# Patient Record
Sex: Male | Born: 1940
Health system: Southern US, Community
[De-identification: ages and names within clinical notes are randomized; demographics above are authoritative.]

## PROBLEM LIST (undated history)

## (undated) DIAGNOSIS — C9140 Hairy cell leukemia not having achieved remission: Secondary | ICD-10-CM

## (undated) DIAGNOSIS — B059 Measles without complication: Secondary | ICD-10-CM

## (undated) DIAGNOSIS — M1990 Unspecified osteoarthritis, unspecified site: Secondary | ICD-10-CM

## (undated) DIAGNOSIS — B269 Mumps without complication: Secondary | ICD-10-CM

## (undated) DIAGNOSIS — M7542 Impingement syndrome of left shoulder: Secondary | ICD-10-CM

## (undated) DIAGNOSIS — S42402D Unspecified fracture of lower end of left humerus, subsequent encounter for fracture with routine healing: Secondary | ICD-10-CM

## (undated) HISTORY — PX: TONSILLECTOMY: SUR1361

---

## 1988-12-11 DIAGNOSIS — C9140 Hairy cell leukemia not having achieved remission: Secondary | ICD-10-CM

## 1988-12-11 HISTORY — PX: OTHER SURGICAL HISTORY: SHX169

## 1988-12-11 HISTORY — DX: Hairy cell leukemia not having achieved remission: C91.40

## 2016-01-13 ENCOUNTER — Ambulatory Visit (INDEPENDENT_AMBULATORY_CARE_PROVIDER_SITE_OTHER): Payer: Medicare Other | Admitting: Sports Medicine

## 2016-01-13 ENCOUNTER — Encounter: Payer: Self-pay | Admitting: Sports Medicine

## 2016-01-13 VITALS — BP 153/93 | HR 75 | Ht 70.0 in | Wt 165.0 lb

## 2016-01-13 DIAGNOSIS — M170 Bilateral primary osteoarthritis of knee: Secondary | ICD-10-CM

## 2016-01-13 MED ORDER — TRAMADOL HCL 50 MG PO TABS: 50.0000 mg | ORAL_TABLET | Freq: Four times a day (QID) | ORAL | Status: DC | PRN

## 2016-01-13 NOTE — Progress Notes (Addendum)
    Subjective:    CC: Bilateral knee pain  HPI: [name] is a 75 YO gentleman with no significant past medical history who presents today for worsening bilateral knee pain. Pain began around 10 years ago. He began working out and strengthening his quads and hamstrings with improvement in his pain for 3-4 years. Over the past year, pain has gradually worsened despite continued strength training. Previous history of R meniscal injury that was repaired 5 years ago. - Pain described as "sharp and aching" and located on the medial and lateral aspects of bilateral knees, R > L - No pain while walking, but going up and down steps and getting out of a car makes the pain worse. - Pain worst at end of day. Rarely wakes him up at night. - Has been taking meloxicam for the past 3 weeks with improvement - Denies catching/popping in the knees - Pain does not radiate.  - Minor swelling of the knees on occasion.  Past medical history, Surgical history, Family history not pertinant except as noted below, Social history, Allergies, and medications have been entered into the medical record, reviewed, and no changes needed.   Patient currently works part time at [employer] and gets in and out of cars frequently. Retired from [employer] after 20 years of service. 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 Is a non-smoker.  Review of Systems: No headache, visual changes, dizziness, skin rash, fevers, chills, weight loss, chest pain, joint swelling, muscle aches, or shortness of breath.  Objective:    General: Well Developed, well nourished, and in no acute distress.  Neuro: Alert and oriented x3, extra-ocular muscles intact, sensation grossly intact.  HEENT: Normocephalic, atraumatic, pupils equal round reactive to light.  Skin: Warm and dry, no rashes noted.   R knee: No erythema, mild joint effusion.  No tenderness to palpation over joint lines and bony  landmarks. 135 degrees flexion, -14 degrees extension 5/5 quadriceps and hamstring strength Negative Thessaly test Neurovascularly intact  L knee: No erythema, mild joint effusion. Medial spurring, lateral collapse, and valgus deformity noted. No tenderness to palpation over joint lines or bony landmarks 120 degrees flexion, -10 degrees extension 5/5 quadriceps and hamstring strength Negative Thessaly test Neurovascularly intact  Feet: L 2nd digit hammer toe R 2nd - 5th digits hammer toes Bilateral pez planus 1-2 cm leg length discrepancy R>L  Gait: R leg valgus eversion R leg external rotation L valgus shift  Review of outside XR of BL knees: Grade 4 osteoarthritis of the bilateral knees Medial collapse on left Lat. Collapse on RT w valgus shift  Impression and Recommendations:   1.) Osteoarthritis of the bilateral knees - Review of patient's x-rays demonstrates significant osteoarthritis of the bilateral knees, but clinically patient is relatively well-appearing and has only minimal limitations in function.  - Corrected leg-length discrepancy with L shoe insert - Encouraged patient to stay active with recumbent bike or stationary bike activity. Avoid deep knee bends, such as lunges or squats - Tramadol 50 mg q6h PRN for pain - Discontinue Mobic (pt reports recent increase in blood pressure)  - Gave prescription for compression sleeves to wear while at work, during physical activity - Interested in clinical trial, will attend meeting on 2/8 -  -     The patient was counselled, risk factors were discussed, anticipatory guidance given.

## 2016-01-14 DIAGNOSIS — M170 Bilateral primary osteoarthritis of knee: Secondary | ICD-10-CM | POA: Insufficient documentation

## 2016-01-14 NOTE — Assessment & Plan Note (Signed)
This is severe OA Compensation is very good because of his strength  HEP will be instituted in knee OA study Will consider compression  Add wedge to left leg to correct gait  Tramadol for pain  I spent 30 mins face to face with > 50% spent counseling for care and HEP for his OA of knees

## 2016-01-26 ENCOUNTER — Other Ambulatory Visit: Payer: Self-pay | Admitting: *Deleted

## 2016-01-26 MED ORDER — TRAMADOL HCL 50 MG PO TABS: 50.0000 mg | ORAL_TABLET | Freq: Three times a day (TID) | ORAL | Status: DC | PRN

## 2016-03-23 ENCOUNTER — Ambulatory Visit (INDEPENDENT_AMBULATORY_CARE_PROVIDER_SITE_OTHER): Payer: Medicare Other | Admitting: Sports Medicine

## 2016-03-23 ENCOUNTER — Encounter: Payer: Self-pay | Admitting: Sports Medicine

## 2016-03-23 VITALS — BP 140/86 | Ht 70.0 in | Wt 165.0 lb

## 2016-03-23 DIAGNOSIS — M170 Bilateral primary osteoarthritis of knee: Secondary | ICD-10-CM

## 2016-03-23 NOTE — Progress Notes (Signed)
Patient ID: Stephen Nixon, male   DOB: March 13, 1941, 75 y.o.   MRN: [id number]  CC: Here to discuss bilateral knee replacement  Patient returns with bilateral grade 4 osteoarthritis of the knees. He has had a great deal of pain relief using tramadol usually about 3 per day. Compression really helps him. He participated in osteoarthritis research project with home exercises that have made him stronger. 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  Today we discussed options for him to consider regarding joint replacement.  Functionally he has significant valgus deformity bilaterally. This affects his gait and gets them daily pain. In addition he has a great deal of difficulty bending his knees. 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 He has difficulty bending getting in and out of cars.  Probably has a good pain tolerance and can actually do a lot of activity in spite of his arthritis he knows that he has difficulty at work and has had to give up his martial arts. He thinks he would like to consider bilateral knee replacement.  He is impressed with quickly I have recovered and feels that he would have the motivation to push himself hard with recovery period  Social history shows that he is retired from the [employer]. He has traveled extensively. He has never been a smoker . He has always been thin and athletic.

## 2016-03-23 NOTE — Patient Instructions (Signed)
[place] [address] [place], [place] [address] [phone] Dr Gaynelle Arabian Thursday 06/08/16 at 1030am

## 2016-03-23 NOTE — Assessment & Plan Note (Signed)
I feel he is an excellent candidate for bilateral knee replacement. However will physical fitness . Low BMI. History of significant athletic training. High motivation . I will send him to Dr. Maureen Ralphs to see if he thinks he is a good candidate for bilateral replacement.  I spent 20 minutes face-to-face counseling regarding his knee arthritis.

## 2016-07-08 ENCOUNTER — Other Ambulatory Visit: Payer: Self-pay | Admitting: Sports Medicine

## 2016-09-22 NOTE — Progress Notes (Signed)
Surgery on 10/04/2016.  Preop on 10/18 need orders in EPIC.  Thank You

## 2016-09-25 NOTE — Progress Notes (Signed)
Surgery on 10/25.  preop on 10/18.  Need orders in EPIC.  Thank You.

## 2016-09-26 NOTE — Progress Notes (Signed)
Surgery on 10/25.  Preop on 10/18.  Need orders in EPIC.  Thank You.

## 2016-09-27 ENCOUNTER — Encounter (HOSPITAL_COMMUNITY)
Admission: RE | Admit: 2016-09-27 | Discharge: 2016-09-27 | Disposition: A | Discharge status: Home / Self Care | Payer: Medicare Other | Source: Ambulatory Visit | Attending: Orthopedic Surgery | Admitting: Orthopedic Surgery

## 2016-09-27 ENCOUNTER — Encounter (HOSPITAL_COMMUNITY): Payer: Self-pay

## 2016-09-27 ENCOUNTER — Ambulatory Visit: Payer: Self-pay | Admitting: Orthopedic Surgery

## 2016-09-27 DIAGNOSIS — M170 Bilateral primary osteoarthritis of knee: Secondary | ICD-10-CM | POA: Diagnosis not present

## 2016-09-27 DIAGNOSIS — Z01812 Encounter for preprocedural laboratory examination: Secondary | ICD-10-CM | POA: Insufficient documentation

## 2016-09-27 HISTORY — DX: Unspecified osteoarthritis, unspecified site: M19.90

## 2016-09-27 LAB — COMPREHENSIVE METABOLIC PANEL
ALBUMIN: 3.9 g/dL (ref 3.5–5.0)
ALT: 14 U/L — ABNORMAL LOW (ref 17–63)
AST: 24 U/L (ref 15–41)
Alkaline Phosphatase: 75 U/L (ref 38–126)
Anion gap: 9 (ref 5–15)
BUN: 17 mg/dL (ref 6–20)
CHLORIDE: 103 mmol/L (ref 101–111)
CO2: 28 mmol/L (ref 22–32)
Calcium: 9 mg/dL (ref 8.9–10.3)
Creatinine, Ser: 0.7 mg/dL (ref 0.61–1.24)
GFR calc Af Amer: >60 mL/min (ref >60)
GFR calc non Af Amer: >60 mL/min (ref >60)
GLUCOSE: 92 mg/dL (ref 65–99)
POTASSIUM: 4 mmol/L (ref 3.5–5.1)
SODIUM: 140 mmol/L (ref 135–145)
Total Bilirubin: 1 mg/dL (ref 0.3–1.2)
Total Protein: 7 g/dL (ref 6.5–8.1)

## 2016-09-27 LAB — CBC
HCT: 42.3 % (ref 39.0–52.0)
Hemoglobin: 14.2 g/dL (ref 13.0–17.0)
MCH: 31.4 pg (ref 26.0–34.0)
MCHC: 33.6 g/dL (ref 30.0–36.0)
MCV: 93.6 fL (ref 78.0–100.0)
PLATELETS: 311 10*3/uL (ref 150–400)
RBC: 4.52 MIL/uL (ref 4.22–5.81)
RDW: 14 % (ref 11.5–15.5)
WBC: 9.1 10*3/uL (ref 4.0–10.5)

## 2016-09-27 LAB — PROTIME-INR
INR: 0.98
PROTHROMBIN TIME: 13 s (ref 11.4–15.2)

## 2016-09-27 LAB — ABO/RH: ABO/RH(D): B POS

## 2016-09-27 LAB — SURGICAL PCR SCREEN
MRSA, PCR: NEGATIVE
STAPHYLOCOCCUS AUREUS: NEGATIVE

## 2016-09-27 LAB — APTT: APTT: 31 s (ref 24–36)

## 2016-09-27 NOTE — Progress Notes (Signed)
Patient did not sign consent form as he stated he was having a partial knee surgery on both knees. Can you clarify this please!

## 2016-09-27 NOTE — Patient Instructions (Signed)
Stephen Nixon  09/27/2016   Your procedure is scheduled on: Wednesday 10/04/2016  Report to [facility] Main  Entrance take [facility]  elevators to 3rd floor to  [place] at  1030  AM.  Call this number if you have problems the morning of surgery [phone]   Remember: ONLY 1 PERSON MAY GO WITH YOU TO SHORT STAY TO GET  READY MORNING OF [place].    Do not eat food  :After Midnight.   MAY HAVE CLEAR LIQUIDS FROM MIDNIGHT UP UNTIL 0730 AM THEN NOTHING UNTIL AFTER SURGERY!     CLEAR LIQUID DIET   Foods Allowed                                                                     Foods Excluded  Coffee and tea, regular and decaf                             liquids that you cannot  Plain Jell-O in any flavor                                             see through such as: Fruit ices (not with fruit pulp)                                     milk, soups, orange juice  Iced Popsicles                                    All solid food Carbonated beverages, regular and diet                                    Cranberry, grape and apple juices Sports drinks like Gatorade Lightly seasoned clear broth or consume(fat free) Sugar, honey syrup  Sample Menu Breakfast                                Lunch                                     Supper Cranberry juice                    Beef broth                            Chicken broth Jell-O                                     Grape juice  Apple juice Coffee or tea                        Jell-O                                      Popsicle                                                Coffee or tea                        Coffee or tea  _____________________________________________________________________     Take these medicines the morning of surgery with A SIP OF WATER: NONE                                You may not have any metal on your body including hair pins and               piercings  Do not wear jewelry, make-up, lotions, powders or perfumes, deodorant             Do not wear nail polish.  Do not shave  48 hours prior to surgery.              Men may shave face and neck.   Do not bring valuables to the hospital. [place].  Contacts, dentures or bridgework may not be worn into surgery.  Leave suitcase in the car. After surgery it may be brought to your room.                  Please read over the following fact sheets you were given: _____________________________________________________________________             [facility] - Preparing for Surgery Before surgery, you can play an important role.  Because skin is not sterile, your skin needs to be as free of germs as possible.  You can reduce the number of germs on your skin by washing with CHG (chlorahexidine gluconate) soap before surgery.  CHG is an antiseptic cleaner which kills germs and bonds with the skin to continue killing germs even after washing. Please DO NOT use if you have an allergy to CHG or antibacterial soaps.  If your skin becomes reddened/irritated stop using the CHG and inform your nurse when you arrive at Short Stay. Do not shave (including legs and underarms) for at least 48 hours prior to the first CHG shower.  You may shave your face/neck. Please follow these instructions carefully:  1.  Shower with CHG Soap the night before surgery and the  morning of Surgery.  2.  If you choose to wash your hair, wash your hair first as usual with your  normal  shampoo.  3.  After you shampoo, rinse your hair and body thoroughly to remove the  shampoo.                           4.  Use CHG as you would any other  liquid soap.  You can apply chg directly  to the skin and wash                       Gently with a scrungie or clean washcloth.  5.  Apply the CHG Soap to your body ONLY FROM THE NECK DOWN.   Do not use on face/ open                            Wound or open sores. Avoid contact with eyes, ears mouth and genitals (private parts).                       Wash face,  Genitals (private parts) with your normal soap.             6.  Wash thoroughly, paying special attention to the area where your surgery  will be performed.  7.  Thoroughly rinse your body with warm water from the neck down.  8.  DO NOT shower/wash with your normal soap after using and rinsing off  the CHG Soap.                9.  Pat yourself dry with a clean towel.            10.  Wear clean pajamas.            11.  Place clean sheets on your bed the night of your first shower and do not  sleep with pets. Day of Surgery : Do not apply any lotions/deodorants the morning of surgery.  Please wear clean clothes to the hospital/surgery center.  FAILURE TO FOLLOW THESE INSTRUCTIONS MAY RESULT IN THE CANCELLATION OF YOUR SURGERY PATIENT SIGNATURE_________________________________  NURSE SIGNATURE__________________________________  ________________________________________________________________________   Stephen Nixon  An incentive spirometer is a tool that can help keep your lungs clear and active. This tool measures how well you are filling your lungs with each breath. Taking long deep breaths may help reverse or decrease the chance of developing breathing (pulmonary) problems (especially infection) following:  A long period of time when you are unable to move or be active. BEFORE THE PROCEDURE   If the spirometer includes an indicator to show your best effort, your nurse or respiratory therapist will set it to a desired goal.  If possible, sit up straight or lean slightly forward. Try not to slouch.  Hold the incentive spirometer in an upright position. INSTRUCTIONS FOR USE  1. Sit on the edge of your bed if possible, or sit up as far as you can in bed or on a chair. 2. Hold the incentive spirometer in an upright position. 3. Breathe out normally. 4. Place  the mouthpiece in your mouth and seal your lips tightly around it. 5. Breathe in slowly and as deeply as possible, raising the piston or the ball toward the top of the column. 6. Hold your breath for 3-5 seconds or for as long as possible. Allow the piston or ball to fall to the bottom of the column. 7. Remove the mouthpiece from your mouth and breathe out normally. 8. Rest for a few seconds and repeat Steps 1 through 7 at least 10 times every 1-2 hours when you are awake. Take your time and take a few normal breaths between deep breaths. 9. The spirometer may include an indicator to show your best effort. Use the indicator as a  goal to work toward during each repetition. 10. After each set of 10 deep breaths, practice coughing to be sure your lungs are clear. If you have an incision (the cut made at the time of surgery), support your incision when coughing by placing a pillow or rolled up towels firmly against it. Once you are able to get out of bed, walk around indoors and cough well. You may stop using the incentive spirometer when instructed by your caregiver.  RISKS AND COMPLICATIONS  Take your time so you do not get dizzy or light-headed.  If you are in pain, you may need to take or ask for pain medication before doing incentive spirometry. It is harder to take a deep breath if you are having pain. AFTER USE  Rest and breathe slowly and easily.  It can be helpful to keep track of a log of your progress. Your caregiver can provide you with a simple table to help with this. If you are using the spirometer at home, follow these instructions: [place] IF:   You are having difficultly using the spirometer.  You have trouble using the spirometer as often as instructed.  Your pain medication is not giving enough relief while using the spirometer.  You develop fever of 100.5 F (38.1 C) or higher. SEEK IMMEDIATE MEDICAL CARE IF:   You cough up bloody sputum that had not been  present before.  You develop fever of 102 F (38.9 C) or greater.  You develop worsening pain at or near the incision site. MAKE SURE YOU:   Understand these instructions.  Will watch your condition.  Will get help right away if you are not doing well or get worse. Document Released: 04/09/2007 Document Revised: 02/19/2012 Document Reviewed: 06/10/2007 ExitCare Patient Information 2014 Exit[employer], [place].   ________________________________________________________________________  WHAT IS A BLOOD TRANSFUSION? Blood Transfusion Information  A transfusion is the replacement of blood or some of its parts. Blood is made up of multiple cells which provide different functions.  Red blood cells carry oxygen and are used for blood loss replacement.  White blood cells fight against infection.  Platelets control bleeding.  Plasma helps clot blood.  Other blood products are available for specialized needs, such as hemophilia or other clotting disorders. BEFORE THE TRANSFUSION  Who gives blood for transfusions?   Healthy volunteers who are fully evaluated to make sure their blood is safe. This is blood bank blood. Transfusion therapy is the safest it has ever been in the practice of medicine. Before blood is taken from a donor, a complete history is taken to make sure that person has no history of diseases nor engages in risky social behavior (examples are intravenous drug use or sexual activity with multiple partners). The donor's travel history is screened to minimize risk of transmitting infections, such as malaria. The donated blood is tested for signs of infectious diseases, such as HIV and hepatitis. The blood is then tested to be sure it is compatible with you in order to minimize the chance of a transfusion reaction. If you or a relative donates blood, this is often done in anticipation of surgery and is not appropriate for emergency situations. It takes many days to process the donated  blood. RISKS AND COMPLICATIONS Although transfusion therapy is very safe and saves many lives, the main dangers of transfusion include:   Getting an infectious disease.  Developing a transfusion reaction. This is an allergic reaction to something in the blood you were given. Every  precaution is taken to prevent this. The decision to have a blood transfusion has been considered carefully by your caregiver before blood is given. Blood is not given unless the benefits outweigh the risks. AFTER THE TRANSFUSION  Right after receiving a blood transfusion, you will usually feel much better and more energetic. This is especially true if your red blood cells have gotten low (anemic). The transfusion raises the level of the red blood cells which carry oxygen, and this usually causes an energy increase.  The nurse administering the transfusion will monitor you carefully for complications. HOME CARE INSTRUCTIONS  No special instructions are needed after a transfusion. You may find your energy is better. Speak with your caregiver about any limitations on activity for underlying diseases you may have. SEEK MEDICAL CARE IF:   Your condition is not improving after your transfusion.  You develop redness or irritation at the intravenous (IV) site. SEEK IMMEDIATE MEDICAL CARE IF:  Any of the following symptoms occur over the next 12 hours:  Shaking chills.  You have a temperature by mouth above 102 F (38.9 C), not controlled by medicine.  Chest, back, or muscle pain.  People around you feel you are not acting correctly or are confused.  Shortness of breath or difficulty breathing.  Dizziness and fainting.  You get a rash or develop hives.  You have a decrease in urine output.  Your urine turns a dark color or changes to pink, red, or brown. Any of the following symptoms occur over the next 10 days:  You have a temperature by mouth above 102 F (38.9 C), not controlled by  medicine.  Shortness of breath.  Weakness after normal activity.  The white part of the eye turns yellow (jaundice).  You have a decrease in the amount of urine or are urinating less often.  Your urine turns a dark color or changes to pink, red, or brown. Document Released: 11/24/2000 Document Revised: 02/19/2012 Document Reviewed: 07/13/2008 [facility] Patient Information 2014 [employer], [place].  _______________________________________________________________________

## 2016-09-28 LAB — URINALYSIS, ROUTINE W REFLEX MICROSCOPIC
Bilirubin Urine: NEGATIVE
Glucose, UA: NEGATIVE mg/dL
Hgb urine dipstick: NEGATIVE
KETONES UR: NEGATIVE mg/dL
LEUKOCYTES UA: NEGATIVE
NITRITE: NEGATIVE
PH: 6 (ref 5.0–8.0)
Protein, ur: NEGATIVE mg/dL
SPECIFIC GRAVITY, URINE: 1.022 (ref 1.005–1.030)

## 2016-09-29 ENCOUNTER — Ambulatory Visit: Payer: Self-pay | Admitting: Orthopedic Surgery

## 2016-10-01 ENCOUNTER — Ambulatory Visit: Payer: Self-pay | Admitting: Orthopedic Surgery

## 2016-10-01 NOTE — H&P (Signed)
Stephen Nixon DOB: [birth year] Married / Language: English / Race: White Male Date of Admission:  10/04/2016 CC:  Bilateral knee pain History of Present Illness The patient is scheduled for a bilateral total knee arthroplasty to be performed by Dr. Dione Plover. Aluisio, MD at [facility] on 10/04/2016. The patient is a 75 year old male who presents with knee complaints. The patient reports left knee and right knee (worse than left) symptoms including: pain, swelling and grinding . The patient describes the severity of the symptoms as moderate in severity. The patient describes their pain as sharp (going up and down stairs).The patient feels that the symptoms are worsening. The patient has the current diagnosis of knee osteoarthritis. Previous work-up for this problem has included knee x-rays. Past treatment for this problem has included intra-articular injection of corticosteroids (right cortisone injection march 2017: did not help) and physical therapy. Symptoms are reported to be located in the left knee, left lateral knee, left medial knee, right knee, right lateral knee and right medial knee and include knee pain and swelling. Current treatment includes knee brace (right knee compression sleeve) and nonsteroidal anti-inflammatory drugs (Tramadol 50mg  twice a day). Both knees bother him terribly. The right knee is more painful than the left. He feels as though the knee is getting worse over time. They are definitely both limiting what he can and cannot do. Cortisone injections did not provide any benefit. Physical therapy did not provide any benefit. He is ready to get the knees fixed. They have been treated conservatively in the past for the above stated problem and despite conservative measures, they continue to have progressive pain and severe functional limitations and dysfunction. They have failed non-operative management including home exercise, medications, and injections. It is felt that  they would benefit from undergoing bilateral total joint replacements. Risks and benefits of the procedure have been discussed with the patient and they elect to proceed with surgery. There are no active contraindications to surgery such as ongoing infection or rapidly progressive neurological disease.  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 Multiple Vitamin (1 (one) Oral) Active. Vitamin C (500MG  Tablet, 1 (one) Oral) Active. Vitamin B1 (100MG  Tablet, Oral) Active. Magnesium (30MG  Tablet, Oral) Active. TraMADol HCl (Oral) Specific strength unknown - Active. (prn per Dr. Oneida Alar with [facility] for his knees) Aspirin (Oral) Specific strength unknown - Active. Medications Reconciled  Past Surgical History Tonsillectomy  Splenectomy  Date: 69.   Review of Systems  General Not Present- Chills,  Fatigue, Fever, Memory  Loss, Night Sweats, Weight Gain and Weight Loss. Skin Not Present- Eczema, Hives, Itching, Lesions and Rash. HEENT Not Present- Dentures, Double Vision, Headache, Hearing Loss, Tinnitus and Visual Loss. Respiratory Not Present- Allergies, Chronic Cough, Coughing up blood, Shortness of breath at rest and Shortness of breath with exertion. Cardiovascular Not Present- Chest Pain, Difficulty Breathing Lying Down, Murmur, Palpitations, Racing/skipping heartbeats and Swelling. Gastrointestinal Not Present- Abdominal Pain, Bloody Stool, Constipation, Diarrhea, Difficulty Swallowing, Heartburn, Jaundice, Loss of appetitie, Nausea and Vomiting. Male Genitourinary Not Present- Blood in Urine, Discharge, Flank Pain, Incontinence, Painful Urination, Urgency, Urinary frequency, Urinary Retention, Urinating at Night and Weak urinary stream. Musculoskeletal Present- Joint Pain and Joint Swelling. Not Present- Back Pain, Morning Stiffness, Muscle Pain, Muscle Weakness and Spasms. Neurological Not Present- Blackout spells, Difficulty with balance, Dizziness, Paralysis, Tremor and Weakness. Psychiatric Not Present- Insomnia.  Vitals Weight: 160 lb Height: 69in Body Surface Area: 1.88 m Body Mass Index: 23.63 kg/m  Pulse: 80 (Regular)  BP: 132/74 (Sitting, Right Arm, Standard)  Physical Exam  General Mental Status -Alert, cooperative and good historian. General Appearance-pleasant, Not in acute distress. Orientation-Oriented X3. Build & Nutrition-Well nourished and Well developed.  Head and Neck Head-normocephalic, atraumatic . Neck Global Assessment - supple, no bruit auscultated on the right, no bruit auscultated on the left.  Eye Vision-Wears corrective lenses. Pupil - Bilateral-Regular and Round. Motion - Bilateral-EOMI.  Chest and Lung Exam Auscultation Breath sounds - clear at anterior chest wall and clear at posterior chest wall. Adventitious sounds - No  Adventitious sounds.  Cardiovascular Auscultation Rhythm - Regular rate and rhythm. Heart Sounds - S1 WNL and S2 WNL. Murmurs & Other Heart Sounds - Auscultation of the heart reveals - No Murmurs.  Abdomen Palpation/Percussion Tenderness - Abdomen is non-tender to palpation. Rigidity (guarding) - Abdomen is soft. Auscultation Auscultation of the abdomen reveals - Bowel sounds normal.  Male Genitourinary Note: Not done, not pertinent to present illness   Musculoskeletal Note: On exam, he is alert and oriented, in no apparent distress. His hips show normal range of motion with no discomfort. His knees show no effusion. Left knee ranges about 5 to 125. Right knee is about 5 to 125 also. There is significant tenderness on the medial aspect of both knees. There is no instability noted at all.  RADIOGRAPHS His radiographs are reviewed from Dr. Nona Dell office. He has got severe bone-on-bone arthritis, medial and patellofemoral compartments of both knees.  Assessment & Plan Primary osteoarthritis of both knees (M17.0)  Note:Surgical Plans: Bilateral Total Knee Replacements  Disposition: Wants to look into Rehab at [facility]  PCP: Dr. Matilde Haymaker - Patient has been seen preoperatively and felt to be stable for surgery.  Topical TXA - Leukemia  Anesthesia Issues: None  Signed electronically by Ok Edwards, III PA-C

## 2016-10-03 NOTE — Progress Notes (Signed)
Notified patient to arrive at 10am due to OR time change to 1300.

## 2016-10-04 ENCOUNTER — Inpatient Hospital Stay (HOSPITAL_COMMUNITY): Payer: Medicare Other | Admitting: Certified Registered Nurse Anesthetist

## 2016-10-04 ENCOUNTER — Encounter (HOSPITAL_COMMUNITY): Payer: Self-pay | Admitting: *Deleted

## 2016-10-04 ENCOUNTER — Inpatient Hospital Stay (HOSPITAL_COMMUNITY)
Admission: RE | Admit: 2016-10-04 | Discharge: 2016-10-07 | DRG: 462 | Disposition: A | Discharge status: Home Health | Payer: Medicare Other | Source: Ambulatory Visit | Attending: Orthopedic Surgery | Admitting: Orthopedic Surgery

## 2016-10-04 ENCOUNTER — Encounter (HOSPITAL_COMMUNITY)
Admission: RE | Disposition: A | Discharge status: Home Health | Payer: Self-pay | Source: Ambulatory Visit | Attending: Orthopedic Surgery

## 2016-10-04 DIAGNOSIS — Z888 Allergy status to other drugs, medicaments and biological substances status: Secondary | ICD-10-CM

## 2016-10-04 DIAGNOSIS — E876 Hypokalemia: Secondary | ICD-10-CM | POA: Diagnosis not present

## 2016-10-04 DIAGNOSIS — Z87891 Personal history of nicotine dependence: Secondary | ICD-10-CM | POA: Diagnosis not present

## 2016-10-04 DIAGNOSIS — M170 Bilateral primary osteoarthritis of knee: Secondary | ICD-10-CM | POA: Diagnosis not present

## 2016-10-04 DIAGNOSIS — M1710 Unilateral primary osteoarthritis, unspecified knee: Secondary | ICD-10-CM | POA: Diagnosis present

## 2016-10-04 DIAGNOSIS — R509 Fever, unspecified: Secondary | ICD-10-CM | POA: Diagnosis not present

## 2016-10-04 DIAGNOSIS — N390 Urinary tract infection, site not specified: Secondary | ICD-10-CM | POA: Diagnosis not present

## 2016-10-04 DIAGNOSIS — M179 Osteoarthritis of knee, unspecified: Secondary | ICD-10-CM | POA: Diagnosis present

## 2016-10-04 DIAGNOSIS — R5082 Postprocedural fever: Secondary | ICD-10-CM | POA: Diagnosis not present

## 2016-10-04 DIAGNOSIS — R609 Edema, unspecified: Secondary | ICD-10-CM | POA: Diagnosis not present

## 2016-10-04 DIAGNOSIS — D62 Acute posthemorrhagic anemia: Secondary | ICD-10-CM | POA: Diagnosis not present

## 2016-10-04 DIAGNOSIS — K5903 Drug induced constipation: Secondary | ICD-10-CM | POA: Diagnosis not present

## 2016-10-04 DIAGNOSIS — Z96653 Presence of artificial knee joint, bilateral: Secondary | ICD-10-CM | POA: Diagnosis not present

## 2016-10-04 DIAGNOSIS — D72829 Elevated white blood cell count, unspecified: Secondary | ICD-10-CM | POA: Diagnosis not present

## 2016-10-04 DIAGNOSIS — D72820 Lymphocytosis (symptomatic): Secondary | ICD-10-CM | POA: Diagnosis not present

## 2016-10-04 DIAGNOSIS — L270 Generalized skin eruption due to drugs and medicaments taken internally: Secondary | ICD-10-CM | POA: Diagnosis not present

## 2016-10-04 HISTORY — DX: Unspecified fracture of lower end of left humerus, subsequent encounter for fracture with routine healing: S42.402D

## 2016-10-04 HISTORY — DX: Impingement syndrome of left shoulder: M75.42

## 2016-10-04 HISTORY — DX: Hairy cell leukemia not having achieved remission: C91.40

## 2016-10-04 HISTORY — DX: Measles without complication: B05.9

## 2016-10-04 HISTORY — PX: TOTAL KNEE ARTHROPLASTY: SHX125

## 2016-10-04 HISTORY — DX: Mumps without complication: B26.9

## 2016-10-04 LAB — TYPE AND SCREEN
ABO/RH(D): B POS
ANTIBODY SCREEN: NEGATIVE

## 2016-10-04 SURGERY — ARTHROPLASTY, KNEE, BILATERAL, TOTAL
Anesthesia: Epidural | Site: Knee | Laterality: Bilateral

## 2016-10-04 MED ORDER — CHLORHEXIDINE GLUCONATE 4 % EX LIQD: 60.0000 mL | Freq: Once | CUTANEOUS | Status: DC

## 2016-10-04 MED ORDER — MIDAZOLAM HCL 5 MG/ML IJ SOLN
2.0000 mg | Freq: Once | INTRAMUSCULAR | Status: AC
  Administered 2016-10-04: 2 mg via INTRAVENOUS

## 2016-10-04 MED ORDER — SODIUM CHLORIDE 0.9 % IV SOLN
INTRAVENOUS | Status: DC | PRN
  Administered 2016-10-04 (×2): 1000 mg via TOPICAL

## 2016-10-04 MED ORDER — POLYETHYLENE GLYCOL 3350 17 G PO PACK: 17.0000 g | PACK | Freq: Every day | ORAL | Status: DC | PRN

## 2016-10-04 MED ORDER — ROPIVACAINE HCL 2 MG/ML IJ SOLN
10.0000 mL/h | INTRAMUSCULAR | Status: DC
  Administered 2016-10-04 – 2016-10-06 (×2): 10 mL/h via EPIDURAL
  Filled 2016-10-04 (×6): qty 200

## 2016-10-04 MED ORDER — LACTATED RINGERS IV SOLN
INTRAVENOUS | Status: DC
  Administered 2016-10-04 (×2): via INTRAVENOUS

## 2016-10-04 MED ORDER — FLEET ENEMA 7-19 GM/118ML RE ENEM: 1.0000 | ENEMA | Freq: Once | RECTAL | Status: DC | PRN

## 2016-10-04 MED ORDER — CEFAZOLIN SODIUM-DEXTROSE 2-4 GM/100ML-% IV SOLN
2.0000 g | INTRAVENOUS | Status: DC
  Filled 2016-10-04: qty 100

## 2016-10-04 MED ORDER — DIPHENHYDRAMINE HCL 12.5 MG/5ML PO ELIX: 12.5000 mg | ORAL_SOLUTION | ORAL | Status: DC | PRN

## 2016-10-04 MED ORDER — BUPIVACAINE HCL (PF) 0.5 % IJ SOLN
INTRAMUSCULAR | Status: DC | PRN
  Administered 2016-10-04: 3 mL via INTRATHECAL

## 2016-10-04 MED ORDER — MIDAZOLAM HCL 2 MG/2ML IJ SOLN
INTRAMUSCULAR | Status: AC
  Filled 2016-10-04: qty 2

## 2016-10-04 MED ORDER — PROPOFOL 10 MG/ML IV BOLUS
INTRAVENOUS | Status: AC
  Filled 2016-10-04: qty 20

## 2016-10-04 MED ORDER — FENTANYL CITRATE (PF) 100 MCG/2ML IJ SOLN
50.0000 ug | Freq: Once | INTRAMUSCULAR | Status: AC
  Administered 2016-10-04: 50 ug via INTRAVENOUS

## 2016-10-04 MED ORDER — PHENOL 1.4 % MT LIQD: 1.0000 | OROMUCOSAL | Status: DC | PRN

## 2016-10-04 MED ORDER — ACETAMINOPHEN 10 MG/ML IV SOLN
INTRAVENOUS | Status: AC
  Filled 2016-10-04: qty 100

## 2016-10-04 MED ORDER — ONDANSETRON HCL 4 MG PO TABS: 4.0000 mg | ORAL_TABLET | Freq: Four times a day (QID) | ORAL | Status: DC | PRN

## 2016-10-04 MED ORDER — PROPOFOL 10 MG/ML IV BOLUS
INTRAVENOUS | Status: DC | PRN
  Administered 2016-10-04 (×3): 10 mg via INTRAVENOUS

## 2016-10-04 MED ORDER — METOCLOPRAMIDE HCL 5 MG PO TABS: 5.0000 mg | ORAL_TABLET | Freq: Three times a day (TID) | ORAL | Status: DC | PRN

## 2016-10-04 MED ORDER — CEFAZOLIN SODIUM-DEXTROSE 2-4 GM/100ML-% IV SOLN
2.0000 g | Freq: Four times a day (QID) | INTRAVENOUS | Status: AC
  Administered 2016-10-04 – 2016-10-05 (×2): 2 g via INTRAVENOUS
  Filled 2016-10-04 (×2): qty 100

## 2016-10-04 MED ORDER — MENTHOL 3 MG MT LOZG: 1.0000 | LOZENGE | OROMUCOSAL | Status: DC | PRN

## 2016-10-04 MED ORDER — BUPIVACAINE HCL (PF) 0.5 % IJ SOLN
INTRAMUSCULAR | Status: AC
  Filled 2016-10-04: qty 30

## 2016-10-04 MED ORDER — METHOCARBAMOL 1000 MG/10ML IJ SOLN
500.0000 mg | Freq: Four times a day (QID) | INTRAMUSCULAR | Status: DC | PRN
  Filled 2016-10-04: qty 5

## 2016-10-04 MED ORDER — OXYCODONE HCL 5 MG/5ML PO SOLN: 5.0000 mg | Freq: Once | ORAL | Status: DC | PRN

## 2016-10-04 MED ORDER — METOCLOPRAMIDE HCL 5 MG/ML IJ SOLN: 5.0000 mg | Freq: Three times a day (TID) | INTRAMUSCULAR | Status: DC | PRN

## 2016-10-04 MED ORDER — FENTANYL CITRATE (PF) 100 MCG/2ML IJ SOLN
INTRAMUSCULAR | Status: AC
  Filled 2016-10-04: qty 2

## 2016-10-04 MED ORDER — CEFAZOLIN SODIUM-DEXTROSE 2-4 GM/100ML-% IV SOLN
INTRAVENOUS | Status: AC
  Filled 2016-10-04: qty 100

## 2016-10-04 MED ORDER — SODIUM CHLORIDE 0.9 % IR SOLN
Status: DC | PRN
  Administered 2016-10-04 (×2): 1000 mL

## 2016-10-04 MED ORDER — SODIUM CHLORIDE 0.9 % IV SOLN
INTRAVENOUS | Status: DC
  Administered 2016-10-04 (×2): via INTRAVENOUS

## 2016-10-04 MED ORDER — PROPOFOL 500 MG/50ML IV EMUL
INTRAVENOUS | Status: DC | PRN
  Administered 2016-10-04: 50 ug/kg/min via INTRAVENOUS

## 2016-10-04 MED ORDER — CEFAZOLIN SODIUM-DEXTROSE 2-3 GM-% IV SOLR
INTRAVENOUS | Status: DC | PRN
  Administered 2016-10-04: 2 g via INTRAVENOUS

## 2016-10-04 MED ORDER — TRAMADOL HCL 50 MG PO TABS: 50.0000 mg | ORAL_TABLET | Freq: Four times a day (QID) | ORAL | Status: DC | PRN

## 2016-10-04 MED ORDER — FENTANYL CITRATE (PF) 100 MCG/2ML IJ SOLN
INTRAMUSCULAR | Status: DC | PRN
  Administered 2016-10-04: 50 ug via INTRAVENOUS

## 2016-10-04 MED ORDER — OXYCODONE HCL 5 MG PO TABS
5.0000 mg | ORAL_TABLET | ORAL | Status: DC | PRN
  Administered 2016-10-04: 20 mg via ORAL
  Administered 2016-10-05 (×3): 10 mg via ORAL
  Administered 2016-10-05: 20 mg via ORAL
  Administered 2016-10-05: 10 mg via ORAL
  Administered 2016-10-06 (×3): 15 mg via ORAL
  Administered 2016-10-06 – 2016-10-07 (×2): 10 mg via ORAL
  Administered 2016-10-07: 15 mg via ORAL
  Administered 2016-10-07: 10 mg via ORAL
  Filled 2016-10-04: qty 4
  Filled 2016-10-04 (×4): qty 2
  Filled 2016-10-04: qty 3
  Filled 2016-10-04 (×2): qty 2
  Filled 2016-10-04 (×3): qty 3
  Filled 2016-10-04: qty 2
  Filled 2016-10-04: qty 4

## 2016-10-04 MED ORDER — DEXAMETHASONE SODIUM PHOSPHATE 10 MG/ML IJ SOLN: 10.0000 mg | Freq: Once | INTRAMUSCULAR | Status: DC

## 2016-10-04 MED ORDER — WARFARIN - PHARMACIST DOSING INPATIENT
Freq: Every day | Status: DC
Start: 2016-10-05 — End: 2016-10-07

## 2016-10-04 MED ORDER — ONDANSETRON HCL 4 MG/2ML IJ SOLN
4.0000 mg | Freq: Four times a day (QID) | INTRAMUSCULAR | Status: DC | PRN
Start: 2016-10-04 — End: 2016-10-07
  Administered 2016-10-06: 4 mg via INTRAVENOUS
  Filled 2016-10-04: qty 2

## 2016-10-04 MED ORDER — DOCUSATE SODIUM 100 MG PO CAPS
100.0000 mg | ORAL_CAPSULE | Freq: Two times a day (BID) | ORAL | Status: DC
  Administered 2016-10-04 – 2016-10-07 (×6): 100 mg via ORAL
  Filled 2016-10-04 (×6): qty 1

## 2016-10-04 MED ORDER — OXYCODONE HCL 5 MG PO TABS: 5.0000 mg | ORAL_TABLET | Freq: Once | ORAL | Status: DC | PRN

## 2016-10-04 MED ORDER — PROPOFOL 10 MG/ML IV BOLUS
INTRAVENOUS | Status: AC
  Filled 2016-10-04: qty 40

## 2016-10-04 MED ORDER — ACETAMINOPHEN 10 MG/ML IV SOLN
1000.0000 mg | Freq: Once | INTRAVENOUS | Status: AC
  Administered 2016-10-04: 1000 mg via INTRAVENOUS
  Filled 2016-10-04: qty 100

## 2016-10-04 MED ORDER — DEXAMETHASONE SODIUM PHOSPHATE 10 MG/ML IJ SOLN
10.0000 mg | Freq: Once | INTRAMUSCULAR | Status: AC
  Administered 2016-10-05: 10 mg via INTRAVENOUS
  Filled 2016-10-04: qty 1

## 2016-10-04 MED ORDER — WARFARIN SODIUM 4 MG PO TABS
4.0000 mg | ORAL_TABLET | Freq: Once | ORAL | Status: DC
  Filled 2016-10-04: qty 1

## 2016-10-04 MED ORDER — CEFAZOLIN SODIUM-DEXTROSE 2-4 GM/100ML-% IV SOLN: 2.0000 g | INTRAVENOUS | Status: DC

## 2016-10-04 MED ORDER — MORPHINE SULFATE (PF) 2 MG/ML IV SOLN: 1.0000 mg | INTRAVENOUS | Status: DC | PRN

## 2016-10-04 MED ORDER — 0.9 % SODIUM CHLORIDE (POUR BTL) OPTIME
TOPICAL | Status: DC | PRN
  Administered 2016-10-04: 1000 mL

## 2016-10-04 MED ORDER — LACTATED RINGERS IV SOLN: INTRAVENOUS | Status: DC

## 2016-10-04 MED ORDER — TRANEXAMIC ACID 1000 MG/10ML IV SOLN
2000.0000 mg | Freq: Once | INTRAVENOUS | Status: DC
  Filled 2016-10-04: qty 20

## 2016-10-04 MED ORDER — HYDROMORPHONE HCL 1 MG/ML IJ SOLN: 0.2500 mg | INTRAMUSCULAR | Status: DC | PRN

## 2016-10-04 MED ORDER — BISACODYL 10 MG RE SUPP: 10.0000 mg | Freq: Every day | RECTAL | Status: DC | PRN

## 2016-10-04 MED ORDER — DEXAMETHASONE SODIUM PHOSPHATE 10 MG/ML IJ SOLN
10.0000 mg | Freq: Once | INTRAMUSCULAR | Status: AC
  Administered 2016-10-04: 10 mg via INTRAVENOUS

## 2016-10-04 MED ORDER — ONDANSETRON HCL 4 MG/2ML IJ SOLN
INTRAMUSCULAR | Status: DC | PRN
  Administered 2016-10-04: 4 mg via INTRAVENOUS

## 2016-10-04 MED ORDER — METHOCARBAMOL 500 MG PO TABS
500.0000 mg | ORAL_TABLET | Freq: Four times a day (QID) | ORAL | Status: DC | PRN
  Administered 2016-10-04 – 2016-10-07 (×8): 500 mg via ORAL
  Filled 2016-10-04 (×8): qty 1

## 2016-10-04 SURGICAL SUPPLY — 56 items
BAG ZIPLOCK 12X15 (MISCELLANEOUS) ×6 IMPLANT
BANDAGE ACE 6X5 VEL STRL LF (GAUZE/BANDAGES/DRESSINGS) ×6 IMPLANT
BANDAGE ELASTIC 6 VELCRO ST LF (GAUZE/BANDAGES/DRESSINGS) ×6 IMPLANT
BANDAGE ESMARK 6X9 LF (GAUZE/BANDAGES/DRESSINGS) ×1 IMPLANT
BLADE SAG 18X100X1.27 (BLADE) ×3 IMPLANT
BLADE SAW SGTL 11.0X1.19X90.0M (BLADE) ×3 IMPLANT
BLADE SURG SZ10 CARB STEEL (BLADE) ×6 IMPLANT
BNDG COHESIVE 6X5 TAN STRL LF (GAUZE/BANDAGES/DRESSINGS) ×3 IMPLANT
BNDG ESMARK 6X9 LF (GAUZE/BANDAGES/DRESSINGS) ×3
BOWL SMART MIX CTS (DISPOSABLE) ×6 IMPLANT
CAPT KNEE TOTAL 3 ATTUNE ×6 IMPLANT
CEMENT HV SMART SET (Cement) ×12 IMPLANT
CLOSURE WOUND 1/2 X4 (GAUZE/BANDAGES/DRESSINGS) ×1
CUFF TOURN SGL QUICK 34 (TOURNIQUET CUFF) ×4
CUFF TRNQT CYL 34X4X40X1 (TOURNIQUET CUFF) ×2 IMPLANT
DRAPE EXTREMITY BILATERAL (DRAPES) ×3 IMPLANT
DRAPE INCISE IOBAN 66X45 STRL (DRAPES) ×3 IMPLANT
DRAPE POUCH INSTRU U-SHP 10X18 (DRAPES) ×3 IMPLANT
DRAPE U-SHAPE 47X51 STRL (DRAPES) ×9 IMPLANT
DRSG ADAPTIC 3X8 NADH LF (GAUZE/BANDAGES/DRESSINGS) ×6 IMPLANT
DRSG PAD ABDOMINAL 8X10 ST (GAUZE/BANDAGES/DRESSINGS) ×6 IMPLANT
DURAPREP 26ML APPLICATOR (WOUND CARE) ×6 IMPLANT
ELECT REM PT RETURN 9FT ADLT (ELECTROSURGICAL) ×3
ELECTRODE REM PT RTRN 9FT ADLT (ELECTROSURGICAL) ×1 IMPLANT
EVACUATOR 1/8 PVC DRAIN (DRAIN) ×6 IMPLANT
FACESHIELD WRAPAROUND (MASK) ×6 IMPLANT
GAUZE SPONGE 4X4 12PLY STRL (GAUZE/BANDAGES/DRESSINGS) ×3 IMPLANT
GLOVE BIO SURGEON STRL SZ7.5 (GLOVE) ×6 IMPLANT
GLOVE BIO SURGEON STRL SZ8 (GLOVE) ×6 IMPLANT
GLOVE BIOGEL PI IND STRL 8 (GLOVE) ×2 IMPLANT
GLOVE BIOGEL PI INDICATOR 8 (GLOVE) ×4
GOWN STRL REUS W/TWL LRG LVL3 (GOWN DISPOSABLE) ×3 IMPLANT
GOWN STRL REUS W/TWL XL LVL3 (GOWN DISPOSABLE) ×3 IMPLANT
HANDPIECE INTERPULSE COAX TIP (DISPOSABLE) ×2
IMMOBILIZER KNEE 20 (SOFTGOODS) ×12 IMPLANT
IMMOBILIZER KNEE 20 THIGH 36 (SOFTGOODS) ×2 IMPLANT
MANIFOLD NEPTUNE II (INSTRUMENTS) ×3 IMPLANT
NS IRRIG 1000ML POUR BTL (IV SOLUTION) ×3 IMPLANT
PACK TOTAL KNEE CUSTOM (KITS) ×3 IMPLANT
PAD ABD 8X10 STRL (GAUZE/BANDAGES/DRESSINGS) ×9 IMPLANT
PADDING CAST COTTON 6X4 STRL (CAST SUPPLIES) ×18 IMPLANT
SET HNDPC FAN SPRY TIP SCT (DISPOSABLE) ×1 IMPLANT
SPONGE LAP 18X18 X RAY DECT (DISPOSABLE) ×3 IMPLANT
STOCKINETTE 8 INCH (MISCELLANEOUS) ×3 IMPLANT
STRIP CLOSURE SKIN 1/2X4 (GAUZE/BANDAGES/DRESSINGS) ×2 IMPLANT
SUCTION FRAZIER HANDLE 12FR (TUBING) ×2
SUCTION TUBE FRAZIER 12FR DISP (TUBING) ×1 IMPLANT
SUT MNCRL AB 4-0 PS2 18 (SUTURE) ×6 IMPLANT
SUT VIC AB 2-0 CT1 27 (SUTURE) ×12
SUT VIC AB 2-0 CT1 TAPERPNT 27 (SUTURE) ×6 IMPLANT
SUT VLOC 180 0 24IN GS25 (SUTURE) ×6 IMPLANT
SYR 50ML LL SCALE MARK (SYRINGE) IMPLANT
TRAY FOLEY W/METER SILVER 16FR (SET/KITS/TRAYS/PACK) ×3 IMPLANT
WATER STERILE IRR 1500ML POUR (IV SOLUTION) ×6 IMPLANT
WRAP KNEE MAXI GEL POST OP (GAUZE/BANDAGES/DRESSINGS) ×6 IMPLANT
YANKAUER SUCT BULB TIP 10FT TU (MISCELLANEOUS) ×3 IMPLANT

## 2016-10-04 NOTE — Progress Notes (Signed)
Assisted Dr. Ermalene Postin with Epidural/Spinal placement. Side rails up, monitors on throughout procedure. See vital signs in flow sheet. Tolerated Procedure well.

## 2016-10-04 NOTE — H&P (View-Only) (Signed)
Stephen Nixon DOB: 07-23-1941 Married / Language: English / Race: White Male Date of Admission:  10/04/2016 CC:  Bilateral knee pain History of Present Illness The patient is scheduled for a bilateral total knee arthroplasty to be performed by Dr. Dione Plover. Aluisio, MD at [facility] on 10/04/2016. The patient is a 75 year old male who presents with knee complaints. The patient reports left knee and right knee (worse than left) symptoms including: pain, swelling and grinding . The patient describes the severity of the symptoms as moderate in severity. The patient describes their pain as sharp (going up and down stairs).The patient feels that the symptoms are worsening. The patient has the current diagnosis of knee osteoarthritis. Previous work-up for this problem has included knee x-rays. Past treatment for this problem has included intra-articular injection of corticosteroids (right cortisone injection march 2017: did not help) and physical therapy. Symptoms are reported to be located in the left knee, left lateral knee, left medial knee, right knee, right lateral knee and right medial knee and include knee pain and swelling. Current treatment includes knee brace (right knee compression sleeve) and nonsteroidal anti-inflammatory drugs (Tramadol 50mg  twice a day). Both knees bother him terribly. The right knee is more painful than the left. He feels as though the knee is getting worse over time. They are definitely both limiting what he can and cannot do. Cortisone injections did not provide any benefit. Physical therapy did not provide any benefit. He is ready to get the knees fixed. They have been treated conservatively in the past for the above stated problem and despite conservative measures, they continue to have progressive pain and severe functional limitations and dysfunction. They have failed non-operative management including home exercise, medications, and injections. It is felt that  they would benefit from undergoing bilateral total joint replacements. Risks and benefits of the procedure have been discussed with the patient and they elect to proceed with surgery. There are no active contraindications to surgery such as ongoing infection or rapidly progressive neurological disease.  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 Multiple Vitamin (1 (one) Oral) Active. Vitamin C (500MG  Tablet, 1 (one) Oral) Active. Vitamin B1 (100MG  Tablet, Oral) Active. Magnesium (30MG  Tablet, Oral) Active. TraMADol HCl (Oral) Specific strength unknown - Active. (prn per Dr. Oneida Alar with [facility] for his knees) Aspirin (Oral) Specific strength unknown - Active. Medications Reconciled  Past Surgical History Tonsillectomy  Splenectomy  Date: 52.   Review of Systems  General Not Present- Chills,  Fatigue, Fever, Memory  Loss, Night Sweats, Weight Gain and Weight Loss. Skin Not Present- Eczema, Hives, Itching, Lesions and Rash. HEENT Not Present- Dentures, Double Vision, Headache, Hearing Loss, Tinnitus and Visual Loss. Respiratory Not Present- Allergies, Chronic Cough, Coughing up blood, Shortness of breath at rest and Shortness of breath with exertion. Cardiovascular Not Present- Chest Pain, Difficulty Breathing Lying Down, Murmur, Palpitations, Racing/skipping heartbeats and Swelling. Gastrointestinal Not Present- Abdominal Pain, Bloody Stool, Constipation, Diarrhea, Difficulty Swallowing, Heartburn, Jaundice, Loss of appetitie, Nausea and Vomiting. Male Genitourinary Not Present- Blood in Urine, Discharge, Flank Pain, Incontinence, Painful Urination, Urgency, Urinary frequency, Urinary Retention, Urinating at Night and Weak urinary stream. Musculoskeletal Present- Joint Pain and Joint Swelling. Not Present- Back Pain, Morning Stiffness, Muscle Pain, Muscle Weakness and Spasms. Neurological Not Present- Blackout spells, Difficulty with balance, Dizziness, Paralysis, Tremor and Weakness. Psychiatric Not Present- Insomnia.  Vitals Weight: 160 lb Height: 69in Body Surface Area: 1.88 m Body Mass Index: 23.63 kg/m  Pulse: 80 (Regular)  BP: 132/74 (Sitting, Right Arm, Standard)  Physical Exam  General Mental Status -Alert, cooperative and good historian. General Appearance-pleasant, Not in acute distress. Orientation-Oriented X3. Build & Nutrition-Well nourished and Well developed.  Head and Neck Head-normocephalic, atraumatic . Neck Global Assessment - supple, no bruit auscultated on the right, no bruit auscultated on the left.  Eye Vision-Wears corrective lenses. Pupil - Bilateral-Regular and Round. Motion - Bilateral-EOMI.  Chest and Lung Exam Auscultation Breath sounds - clear at anterior chest wall and clear at posterior chest wall. Adventitious sounds - No  Adventitious sounds.  Cardiovascular Auscultation Rhythm - Regular rate and rhythm. Heart Sounds - S1 WNL and S2 WNL. Murmurs & Other Heart Sounds - Auscultation of the heart reveals - No Murmurs.  Abdomen Palpation/Percussion Tenderness - Abdomen is non-tender to palpation. Rigidity (guarding) - Abdomen is soft. Auscultation Auscultation of the abdomen reveals - Bowel sounds normal.  Male Genitourinary Note: Not done, not pertinent to present illness   Musculoskeletal Note: On exam, he is alert and oriented, in no apparent distress. His hips show normal range of motion with no discomfort. His knees show no effusion. Left knee ranges about 5 to 125. Right knee is about 5 to 125 also. There is significant tenderness on the medial aspect of both knees. There is no instability noted at all.  RADIOGRAPHS His radiographs are reviewed from Dr. Nona Dell office. He has got severe bone-on-bone arthritis, medial and patellofemoral compartments of both knees.  Assessment & Plan Primary osteoarthritis of both knees (M17.0)  Note:Surgical Plans: Bilateral Total Knee Replacements  Disposition: Wants to look into Rehab at [facility]  PCP: Dr. Matilde Haymaker - Patient has been seen preoperatively and felt to be stable for surgery.  Topical TXA - Leukemia  Anesthesia Issues: None  Signed electronically by Ok Edwards, III PA-C

## 2016-10-04 NOTE — Transfer of Care (Signed)
Immediate Anesthesia Transfer of Care Note  Patient: Stephen Nixon  Procedure(s) Performed: Procedure(s): BILATERAL TOTAL KNEE ARTHROPLASTY (Bilateral)  Patient Location: PACU  Anesthesia Type:Spinal with epidural for postop pain (CSE)  Level of Consciousness:  sedated, patient cooperative and responds to stimulation  Airway & Oxygen Therapy:Patient Spontanous Breathing and Patient connected to face mask oxgen  Post-op Assessment:  Report given to PACU RN and Post -op Vital signs reviewed and stable  Post vital signs:  Reviewed and stable  Last Vitals:  Vitals:   10/04/16 1350 10/04/16 1355  BP: 124/77 126/76  Pulse: 76 71  Resp: 10 15  Temp:      Complications: No apparent anesthesia complications

## 2016-10-04 NOTE — Progress Notes (Signed)
ANTICOAGULATION CONSULT NOTE - Initial Consult  Pharmacy Consult for Warfarin Indication: VTE prophylaxis  Allergies  Allergen Reactions  . Tylenol [Acetaminophen] Rash    Patient Measurements: Height: 5\' 10"  (177.8 cm) Weight: 162 lb 12 oz (73.8 kg) IBW/kg (Calculated) : 73 Heparin Dosing Weight:   Vital Signs: Temp: 97.6 F (36.4 C) (10/25 1734) Temp Source: Oral (10/25 1015) BP: 134/84 (10/25 1734) Pulse Rate: 77 (10/25 1734)  Labs: No results for input(s): HGB, HCT, PLT, APTT, LABPROT, INR, HEPARINUNFRC, HEPRLOWMOCWT, CREATININE, CKTOTAL, CKMB, TROPONINI in the last 72 hours.  Estimated Creatinine Clearance: 84.9 mL/min (by C-G formula based on SCr of 0.7 mg/dL).   Medical History: Past Medical History:  Diagnosis Date  . Anemia 1990   Hairy cell keukemia  . Arthritis    Assessment: 88 yoM with bilateral end stage knee osteoarthritis s/p bilateral TKA on 10/25.  Pt currently with ropivicaine epidural in place.  Pharmacy consulted to start warfarin dosing for VTE px tomorrow mornining 10/05/16 POD1 @ 0900.  Baseline PT/INR and aPTT WNL.  No issues noted with CBC.    Goal of Therapy:  INR 2-3 Monitor platelets by anticoagulation protocol: Yes   Plan:  Warfarin 4mg  po x1 tomorrow AM Daily PT/INR and CBC  F/u removal of epidural / signs/symptoms of bleeding  Ralene Bathe, PharmD, BCPS 10/04/2016, 6:34 PM  Pager: [phone]

## 2016-10-04 NOTE — Anesthesia Procedure Notes (Signed)
Epidural Patient location during procedure: pre-op  Staffing Anesthesiologist: Aaleigha Bozza Performed: anesthesiologist   Preanesthetic Checklist Completed: patient identified, surgical consent, pre-op evaluation, timeout performed, IV checked, risks and benefits discussed and monitors and equipment checked  Epidural Patient position: sitting Patient monitoring: heart rate, cardiac monitor, continuous pulse ox and blood pressure Approach: midline Location: L3-L4 Injection technique: LOR saline  Needle:  Needle type: Tuohy  Needle gauge: 17 G Needle length: 9 cm Needle insertion depth: 7 cm Catheter type: closed end flexible Catheter size: 19 Gauge Catheter at skin depth: 12 cm Test dose: Other  Assessment Sensory level: T7 Events: blood not aspirated, injection not painful, no injection resistance, negative IV test and no paresthesia  Additional Notes Combines spinal epidural. After LOR with saline a 25g Pencan was inserted through the Touhy with return of CSF. 88ml 0.5% Marcaine injected. Pencan removed and epidural catheter advanced through touhy and left at 12cm. Secured. Test dose deferred until spinal subsides to avoid high sensory level. Reason for block:surgical anesthesia

## 2016-10-04 NOTE — Anesthesia Preprocedure Evaluation (Signed)
Anesthesia Evaluation  Patient identified by MRN, date of birth, ID band Patient awake    Reviewed: Allergy & Precautions, NPO status , Patient's Chart, lab work & pertinent test results  History of Anesthesia Complications Negative for: history of anesthetic complications  Airway Mallampati: II  TM Distance: >3 FB Neck ROM: Full    Dental  (+) Teeth Intact   Pulmonary former smoker,    breath sounds clear to auscultation       Cardiovascular negative cardio ROS   Rhythm:Regular     Neuro/Psych negative neurological ROS  negative psych ROS   GI/Hepatic negative GI ROS, Neg liver ROS,   Endo/Other  negative endocrine ROS  Renal/GU negative Renal ROS     Musculoskeletal  (+) Arthritis ,   Abdominal   Peds  Hematology negative hematology ROS (+)   Anesthesia Other Findings   Reproductive/Obstetrics                             Anesthesia Physical Anesthesia Plan  ASA: II  Anesthesia Plan: Combined Spinal and Epidural   Post-op Pain Management:    Induction:   Airway Management Planned: Natural Airway, Nasal Cannula and Simple Face Mask  Additional Equipment: None  Intra-op Plan:   Post-operative Plan:   Informed Consent: I have reviewed the patients History and Physical, chart, labs and discussed the procedure including the risks, benefits and alternatives for the proposed anesthesia with the patient or authorized representative who has indicated his/her understanding and acceptance.   Dental advisory given  Plan Discussed with: CRNA and Surgeon  Anesthesia Plan Comments:         Anesthesia Quick Evaluation

## 2016-10-04 NOTE — Interval H&P Note (Signed)
History and Physical Interval Note:  10/04/2016 1:35 PM  Stephen Nixon  has presented today for surgery, with the diagnosis of BILATERAL KNEE OA  The various methods of treatment have been discussed with the patient and family. After consideration of risks, benefits and other options for treatment, the patient has consented to  Procedure(s): BILATERAL TOTAL KNEE ARTHROPLASTY (Bilateral) as a surgical intervention .  The patient's history has been reviewed, patient examined, no change in status, stable for surgery.  I have reviewed the patient's chart and labs.  Questions were answered to the patient's satisfaction.     Gearlean Alf

## 2016-10-04 NOTE — Op Note (Signed)
Pre-operative diagnosis- Osteoarthritis  Bilateral knee(s)  Post-operative diagnosis- Osteoarthritis Bilateral knee(s)  Procedure-  Bilateral  Total Knee Arthroplasty  Surgeon- Dione Plover. Manpreet Strey, MD  Assistant- Claudell Kyle, PA-C   Anesthesia-  Spinal and Epidural  EBL-* No blood loss amount entered *   Drains Hemovac x 1 each side  Tourniquet time-  Total Tourniquet Time Documented: Thigh (Right) - 31 minutes Total: Thigh (Right) - 31 minutes  Thigh (Left) - 31 minutes Total: Thigh (Left) - 31 minutes     Complications- None  Condition-PACU - hemodynamically stable.   Brief Clinical Note  Stephen Nixon is a 75 y.o. year old male with end stage OA of both knees with progressively worsening pain and dysfunction. The patient has constant pain, with activity and at rest and significant functional deficits with difficulties even with ADLs. The patient has had extensive non-op management including analgesics, injections of cortisone and viscosupplements, and home exercise program, but remains in significant pain with significant dysfunction. We discussed replacing both knees in the same setting versus one at a time including procedure, risks, potential complications, rehab course, and pros and cons associated with each and the patient elects to do both knees at the same time. The patient presents now for bilateral Total Knee Arthroplasty.     Procedure in detail---   The patient is brought into the operating room and positioned supine on the operating table. After successful administration of  Spinal and Epidural,   a tourniquet is placed high on the  Bilateral thigh(s) and the lower extremities are prepped and draped in the usual sterile fashion. Time out is performed by the operating team and then the  Right lower extremity is wrapped in Esmarch, knee flexed and the tourniquet inflated to 300 mmHg.       A midline incision is made with a ten blade through the subcutaneous tissue to the  level of the extensor mechanism. A fresh blade is used to make a medial parapatellar arthrotomy. Soft tissue over the proximal medial tibia is subperiosteally elevated to the joint line with a knife and into the semimembranosus bursa with a Cobb elevator. Soft tissue over the proximal lateral tibia is elevated with attention being paid to avoiding the patellar tendon on the tibial tubercle. The patella is everted, knee flexed 90 degrees and the ACL and PCL are removed. Findings are bone on bone lateral and patellofemoral with large global osteophytes.        The drill is used to create a starting hole in the distal femur and the canal is thoroughly irrigated with sterile saline to remove the fatty contents. The 5 degree Right  valgus alignment guide is placed into the femoral canal and the distal femoral cutting block is pinned to remove 10 mm off the distal femur. Resection is made with an oscillating saw.      The tibia is subluxed forward and the menisci are removed. The extramedullary alignment guide is placed referencing proximally at the medial aspect of the tibial tubercle and distally along the second metatarsal axis and tibial crest. The block is pinned to remove 72mm off the more deficient lateral  side. Resection is made with an oscillating saw. Size 7is the most appropriate size for the tibia and the proximal tibia is prepared with the modular drill and keel punch for that size.      The femoral sizing guide is placed and size 7 is most appropriate. Rotation is marked off the epicondylar axis and confirmed  by creating a rectangular flexion gap at 90 degrees. The size 7 cutting block is pinned in this rotation and the anterior, posterior and chamfer cuts are made with the oscillating saw. The intercondylar block is then placed and that cut is made.      Trial size 7 tibial component, trial size 7 posterior stabilized femur and a 6  mm posterior stabilized rotating platform insert trial is placed. Full  extension is achieved with excellent varus/valgus and anterior/posterior balance throughout full range of motion. The patella is everted and thickness measured to be 27  mm. Free hand resection is taken to 15 mm, a 41 template is placed, lug holes are drilled, trial patella is placed, and it tracks normally. Osteophytes are removed off the posterior femur with the trial in place. All trials are removed and the cut bone surfaces prepared with pulsatile lavage. Cement is mixed and once ready for implantation, the size 7 tibial implant, size  7 posterior stabilized femoral component, and the size 41 patella are cemented in place and the patella is held with the clamp. The trial insert is placed and the knee held in full extension.  All extruded cement is removed and once the cement is hard the permanent 6 mm posterior stabilized rotating platform insert is placed into the tibial tray.      The wound is copiously irrigated with saline solution and the extensor mechanism closed over a hemovac drain with #1 V-loc suture. The tourniquet is released for a total tourniquet time of 31  minutes. Flexion against gravity is 140 degrees and the patella tracks normally. Subcutaneous tissue is closed with 2.0 vicryl and subcuticular with running 4.0 Monocryl.       The  Left lower extremity is wrapped in Esmarch, knee flexed and the tourniquet inflated to 300 mmHg.       A midline incision is made with a ten blade through the subcutaneous tissue to the level of the extensor mechanism. A fresh blade is used to make a medial parapatellar arthrotomy. Soft tissue over the proximal medial tibia is subperiosteally elevated to the joint line with a knife and into the semimembranosus bursa with a Cobb elevator. Soft tissue over the proximal lateral tibia is elevated with attention being paid to avoiding the patellar tendon on the tibial tubercle. The patella is everted, knee flexed 90 degrees and the ACL and PCL are removed. Findings  are bone on bone lateral and patellofemoral with large lateral and patellar osteophytes.        The drill is used to create a starting hole in the distal femur and the canal is thoroughly irrigated with sterile saline to remove the fatty contents. The 5 degree Left  valgus alignment guide is placed into the femoral canal and the distal femoral cutting block is pinned to remove 10 mm off the distal femur. Resection is made with an oscillating saw.      The tibia is subluxed forward and the menisci are removed. The extramedullary alignment guide is placed referencing proximally at the medial aspect of the tibial tubercle and distally along the second metatarsal axis and tibial crest. The block is pinned to remove 62mm off the more deficient lateral  side. Resection is made with an oscillating saw. Size 7is the most appropriate size for the tibia and the proximal tibia is prepared with the modular drill and keel punch for that size.      The femoral sizing guide is placed and size 7  is most appropriate. Rotation is marked off the epicondylar axis and confirmed by creating a rectangular flexion gap at 90 degrees. The size 7 cutting block is pinned in this rotation and the anterior, posterior and chamfer cuts are made with the oscillating saw. The intercondylar block is then placed and that cut is made.      Trial size 7 tibial component, trial size 7 posterior stabilized femur and a 6  mm posterior stabilized rotating platform insert trial is placed. Full extension is achieved with excellent varus/valgus and anterior/posterior balance throughout full range of motion. The patella is everted and thickness measured to be 25  mm. Free hand resection is taken to 15 mm, a 38 template is placed, lug holes are drilled, trial patella is placed, and it tracks normally. Osteophytes are removed off the posterior femur with the trial in place. All trials are removed and the cut bone surfaces prepared with pulsatile lavage. Cement  is mixed and once ready for implantation, the size 7 tibial implant, size  7 posterior stabilized femoral component, and the size 38 patella are cemented in place and the patella is held with the clamp. The trial insert is placed and the knee held in full extension.   All extruded cement is removed and once the cement is hard the permanent 6 mm posterior stabilized rotating platform insert is placed into the tibial tray.      The wound is copiously irrigated with saline solution and the extensor mechanism closed over a hemovac drain with #1 V-loc suture. The tourniquet is released for a total tourniquet time of 31  minutes. Flexion against gravity is 140 degrees and the patella tracks normally. Subcutaneous tissue is closed with 2.0 vicryl and subcuticular with running 4.0 Monocryl. The incisions are cleaned and dried and steri-strips and  bulky sterile dressings are applied. The limbs are placed into knee immobilizers and the patient is awakened and transported to recovery in stable condition.            Please note that a surgical assistant was a medical necessity for this procedure in order to perform it in a safe and expeditious manner. Surgical assistant was necessary to retract the ligaments and vital neurovascular structures to prevent injury to them and also necessary for proper positioning of the limb to allow for anatomic placement of the prosthesis.   Dione Plover Flo Berroa, MD    10/04/2016, 4:06 PM

## 2016-10-05 LAB — PROTIME-INR
INR: 1.14
Prothrombin Time: 14.6 seconds (ref 11.4–15.2)

## 2016-10-05 LAB — CBC
HEMATOCRIT: 35.5 % — AB (ref 39.0–52.0)
Hemoglobin: 11.9 g/dL — ABNORMAL LOW (ref 13.0–17.0)
MCH: 31.5 pg (ref 26.0–34.0)
MCHC: 33.5 g/dL (ref 30.0–36.0)
MCV: 93.9 fL (ref 78.0–100.0)
PLATELETS: 277 10*3/uL (ref 150–400)
RBC: 3.78 MIL/uL — ABNORMAL LOW (ref 4.22–5.81)
RDW: 13.9 % (ref 11.5–15.5)
WBC: 17.6 10*3/uL — ABNORMAL HIGH (ref 4.0–10.5)

## 2016-10-05 LAB — BASIC METABOLIC PANEL
ANION GAP: 7 (ref 5–15)
BUN: 16 mg/dL (ref 6–20)
CALCIUM: 8.4 mg/dL — AB (ref 8.9–10.3)
CO2: 27 mmol/L (ref 22–32)
CREATININE: 0.89 mg/dL (ref 0.61–1.24)
Chloride: 108 mmol/L (ref 101–111)
GFR calc Af Amer: >60 mL/min (ref >60)
GLUCOSE: 138 mg/dL — AB (ref 65–99)
Potassium: 4 mmol/L (ref 3.5–5.1)
Sodium: 142 mmol/L (ref 135–145)

## 2016-10-05 MED ORDER — SODIUM CHLORIDE 0.9 % IV BOLUS (SEPSIS)
250.0000 mL | Freq: Once | INTRAVENOUS | Status: AC
  Administered 2016-10-05: 250 mL via INTRAVENOUS

## 2016-10-05 MED ORDER — WARFARIN SODIUM 4 MG PO TABS
4.0000 mg | ORAL_TABLET | Freq: Every day | ORAL | Status: AC
  Administered 2016-10-05: 4 mg via ORAL
  Filled 2016-10-05: qty 1

## 2016-10-05 NOTE — Evaluation (Signed)
Occupational Therapy Evaluation Patient Details Name: Stephen Nixon MRN: [id number] DOB: May 17, 1941 Today's Date: 10/05/2016    History of Present Illness Pt s/p Bil TKR   Clinical Impression   This 75 year old man was admitted for the above sx.  He will benefit from continued OT to increase safety and independence with adls.  Goals are for min guard to min A levels.  Pt currently needs mod +2 assistance for safety    Follow Up Recommendations  Supervision/Assistance - 24 hour    Equipment Recommendations   (to be further assessed for toilet:  pt hopes for none) Wife will look into a tub bench   Recommendations for Other Services       Precautions / Restrictions Precautions Precautions: Knee;Fall Precaution Comments: orthostatic on eval Required Braces or Orthoses: Knee Immobilizer - Right;Knee Immobilizer - Left Knee Immobilizer - Right: Discontinue once straight leg raise with < 10 degree lag Knee Immobilizer - Left: Discontinue once straight leg raise with < 10 degree lag Restrictions Weight Bearing Restrictions: No RLE Weight Bearing: Weight bearing as tolerated LLE Weight Bearing: Weight bearing as tolerated      Mobility Bed Mobility Overal bed mobility: Needs Assistance;+2 for physical assistance;+ 2 for safety/equipment Bed Mobility: Sit to Supine     Supine to sit: Min assist;+2 for physical assistance;+2 for safety/equipment Sit to supine: Min assist;Mod assist;+2 for physical assistance;+2 for safety/equipment   General bed mobility comments: cues for sequence with physical assist to manage LEs and bring trunk to upright  Transfers Overall transfer level: Needs assistance Equipment used: Rolling walker (2 wheeled) Transfers: Sit to/from Stand Sit to Stand: Mod assist;+2 physical assistance;+2 safety/equipment;From elevated surface         General transfer comment: cues for walking LEs fwd and back and for use of UEs to self assist    Balance                                             ADL Overall ADL's : Needs assistance/impaired                         Toilet Transfer: Moderate assist;+2 for safety/equipment;Ambulation (around bed)             General ADL Comments: did not fully assess LB adls--Pt orthostatic after walking around bed. Educated on AE and tub bench. Will practice with these on next visit.  Pt would like to try leg lifter.  Wife can assist with adls, but pt wants to be as independent as possible.  They have a reacher at home.  He is able to perform UB adls with set up to min A due to lines.  Epidural in place.     Vision     Perception     Praxis      Pertinent Vitals/Pain Pain Assessment: 0-10 Pain Score: 3  Pain Location: Bil knees Pain Descriptors / Indicators: Aching;Sore Pain Intervention(s): Limited activity within patient's tolerance;Monitored during session;Premedicated before session;Ice applied     Hand Dominance     Extremity/Trunk Assessment Upper Extremity Assessment Upper Extremity Assessment: Overall WFL for tasks assessed      Cervical / Trunk Assessment Cervical / Trunk Assessment: Normal   Communication Communication Communication: No difficulties   Cognition Arousal/Alertness: Awake/alert Behavior During Therapy: WFL for tasks assessed/performed Overall Cognitive  Status: Within Functional Limits for tasks assessed                     General Comments       Exercises Exercises: Total Joint     Shoulder Instructions      Home Living Family/patient expects to be discharged to:: Private residence Living Arrangements: Spouse/significant other Available Help at Discharge: Family Type of Home: House Home Access: Stairs to enter Technical brewer of Steps: 1   Home Layout: Able to live on main level with bedroom/bathroom     Bathroom Shower/Tub: Tub/shower unit Shower/tub characteristics: Architectural technologist:  Handicapped height     Home Equipment: None   Additional Comments: pt installed a grab bar and has a counter next to comfort height commode      Prior Functioning/Environment Level of Independence: Independent                 OT Problem List: Cardiopulmonary status limiting activity;Decreased activity tolerance;Decreased knowledge of use of DME or AE;Pain   OT Treatment/Interventions: Self-care/ADL training;DME and/or AE instruction;Patient/family education    OT Goals(Current goals can be found in the care plan section) Acute Rehab OT Goals Patient Stated Goal: Go home instead of CIR if I am doing well enough OT Goal Formulation: With patient Time For Goal Achievement: 10/12/16 Potential to Achieve Goals: Good ADL Goals Pt Will Perform Grooming: with min guard assist;standing Pt Will Transfer to Toilet: with min guard assist;ambulating (comfort height with sink/grab bar) Pt Will Perform Tub/Shower Transfer: Tub transfer;tub bench;with min assist Additional ADL Goal #1: pt will verbalize vs demonstrate with supervision use of AE for adls and bed mobility  OT Frequency: Min 2X/week   Barriers to D/C:            Co-evaluation   Reason for Co-Treatment: For patient/therapist safety PT goals addressed during session: Mobility/safety with mobility OT goals addressed during session: ADL's and self-care      End of Session CPM Right Knee CPM Right Knee: On  Activity Tolerance:  (limited by BP) Patient left: in bed;with call bell/phone within reach;with bed alarm set;with family/visitor present   Time: [id number] OT Time Calculation (min): 26 min Charges:  OT General Charges $OT Visit: 1 Procedure OT Evaluation $OT Eval Low Complexity: 1 Procedure G-Codes:    Guhan Bruington October 28, 2016, 2:49 PM Lesle Chris, OTR/L [phone] 2016-10-28

## 2016-10-05 NOTE — Progress Notes (Signed)
CSW consulted to assist with d/c planning. Spoke with PA this am to clarify d/c plan. Pt requesting acute rehab placement at [place] is aware and will assist with d/c planning. CSW will remain available to assist with SNF placement if required.  Werner Lean LCSW [phone]

## 2016-10-05 NOTE — Progress Notes (Signed)
13:01: Faxed Initial PT evaluation to Pam, Admissions  Coordinator at [facility] for review.

## 2016-10-05 NOTE — Progress Notes (Signed)
Subjective: 1 Day Post-Op Procedure(s) (LRB): BILATERAL TOTAL KNEE ARTHROPLASTY (Bilateral) Patient reports pain as mild.   Patient seen in rounds by Dr. Wynelle Link. Patient is well, and has had no acute complaints or problems We will start therapy today.   He is sitting up in bed and looks good today. Plan is to go Rehab after hospital stay.  He would like to look into the Inpatient Rehab at [facility]. Will get social worker involved.  Objective: Vital signs in last 24 hours: Temp:  [97.4 F (36.3 C)-98.7 F (37.1 C)] 98.7 F (37.1 C) (10/26 0349) Pulse Rate:  [61-96] 87 (10/26 0349) Resp:  [10-30] 14 (10/26 0349) BP: (87-160)/(58-96) 87/58 (10/26 0349) SpO2:  [97 %-100 %] 97 % (10/26 0349) Weight:  [73.8 kg (162 lb 12 oz)] 73.8 kg (162 lb 12 oz) (10/25 1049)  Intake/Output from previous day:  Intake/Output Summary (Last 24 hours) at 10/05/16 0759 Last data filed at 10/05/16 0656  Gross per 24 hour  Intake          4590.58 ml  Output             1933 ml  Net          2657.58 ml    Intake/Output this shift: No intake/output data recorded.  Labs:  Recent Labs  10/05/16 0434  HGB 11.9*    Recent Labs  10/05/16 0434  WBC 17.6*  RBC 3.78*  HCT 35.5*  PLT 277    Recent Labs  10/05/16 0434  NA 142  K 4.0  CL 108  CO2 27  BUN 16  CREATININE 0.89  GLUCOSE 138*  CALCIUM 8.4*    Recent Labs  10/05/16 0434  INR 1.14    EXAM General - Patient is Alert, Appropriate and Oriented Extremity - Neurovascular intact Sensation intact distally Dorsiflexion/Plantar flexion intact Dressing - dressing C/D/I Motor Function - intact, moving feet and toes well on exam.  Both Hemovacs pulled without difficulty.  Past Medical History:  Diagnosis Date  . Anemia 1990   Hairy cell keukemia  . Arthritis     Assessment/Plan: 1 Day Post-Op Procedure(s) (LRB): BILATERAL TOTAL KNEE ARTHROPLASTY (Bilateral) Principal Problem:   Primary osteoarthritis of both  knees Active Problems:   OA (osteoarthritis) of knee  Estimated body mass index is 23.35 kg/m as calculated from the following:   Height as of this encounter: 5\' 10"  (1.778 m).   Weight as of this encounter: 73.8 kg (162 lb 12 oz). Advance diet Up with therapy Continue foley for now.  Will keep foley until tomorrow and will not be removed until at least 6-8 hours following the removal of the epidural catheter.  DVT Prophylaxis - Lovenox and Coumadin, Lovenox will not start until tomorrow afternoon following removal of the epidural. First dose of Coumadin this evening at 6 PM Weight-Bearing as tolerated to both leg  Continue O2 and Pulse OX   Take Coumadin for four weeks and then discontinue.  The dose may need to be adjusted based upon the INR.  Please follow the INR and titrate Coumadin dose for a therapeutic range between 2.0 and 3.0 INR.  After completing the four weeks of Coumadin, the patient may stop the Coumadin and resume their 325 mg Aspirin daily.  Lovenox injections will start tomorrow evening after the epidural has been removed and continue until the INR is therapeutic at or greater than 2.0.  When INR reaches the therapeutic level of equal to or greater than  2.0, the patient may discontinue the Lovenox injections.  Arlee Muslim, PA-C Orthopaedic Surgery 10/05/2016, 7:59 AM

## 2016-10-05 NOTE — Progress Notes (Signed)
Rehab Admissions Coordinator Note:  Patient was screened by Retta Diones for appropriateness for an Inpatient Acute Rehab Consult. Noted from social worker that patient would like inpatient rehab in [facility].  If this changes, feel free to call me to evaluate patient for [facility] inpatient rehab admission.  Retta Diones 10/05/2016, 12:49 PM  I can be reached at [phone].

## 2016-10-05 NOTE — Discharge Instructions (Signed)
Information on my medicine - Coumadin   (Warfarin)  This medication education was reviewed with me or my healthcare representative as part of my discharge preparation.  The pharmacist that spoke with me during my hospital stay was:  Sallyanne Havers, Student-PharmD  Why was Coumadin prescribed for you? Coumadin was prescribed for you because you have a blood clot or a medical condition that can cause an increased risk of forming blood clots. Blood clots can cause serious health problems by blocking the flow of blood to the heart, lung, or brain. Coumadin can prevent harmful blood clots from forming. As a reminder your indication for Coumadin is:   Blood Clot Prevention After Orthopedic Surgery  What test will check on my response to Coumadin? While on Coumadin (warfarin) you will need to have an INR test regularly to ensure that your dose is keeping you in the desired range. The INR (international normalized ratio) number is calculated from the result of the laboratory test called prothrombin time (PT).  If an INR APPOINTMENT HAS NOT ALREADY BEEN MADE FOR YOU please schedule an appointment to have this lab work done by your health care provider within 7 days. Your INR goal is usually a number between:  2 to 3 or your provider may give you a more narrow range like 2-2.5.  Ask your health care provider during an office visit what your goal INR is.  What  do you need to  know  About  COUMADIN? Take Coumadin (warfarin) exactly as prescribed by your healthcare provider about the same time each day.  DO NOT stop taking without talking to the doctor who prescribed the medication.  Stopping without other blood clot prevention medication to take the place of Coumadin may increase your risk of developing a new clot or stroke.  Get refills before you run out.  What do you do if you miss a dose? If you miss a dose, take it as soon as you remember on the same day then continue your regularly scheduled regimen the  next day.  Do not take two doses of Coumadin at the same time.  Important Safety Information A possible side effect of Coumadin (Warfarin) is an increased risk of bleeding. You should call your healthcare provider right away if you experience any of the following: ? Bleeding from an injury or your nose that does not stop. ? Unusual colored urine (red or dark brown) or unusual colored stools (red or black). ? Unusual bruising for unknown reasons. ? A serious fall or if you hit your head (even if there is no bleeding).  Some foods or medicines interact with Coumadin (warfarin) and might alter your response to warfarin. To help avoid this: ? Eat a balanced diet, maintaining a consistent amount of Vitamin K. ? Notify your provider about major diet changes you plan to make. ? Avoid alcohol or limit your intake to 1 drink for women and 2 drinks for men per day. (1 drink is 5 oz. wine, 12 oz. beer, or 1.5 oz. liquor.)  Make sure that ANY health care provider who prescribes medication for you knows that you are taking Coumadin (warfarin).  Also make sure the healthcare provider who is monitoring your Coumadin knows when you have started a new medication including herbals and non-prescription products.  Coumadin (Warfarin)  Major Drug Interactions  Increased Warfarin Effect Decreased Warfarin Effect  Alcohol (large quantities) Antibiotics (esp. Septra/Bactrim, Flagyl, Cipro) Amiodarone (Cordarone) Aspirin (ASA) Cimetidine (Tagamet) Megestrol (Megace) NSAIDs (ibuprofen, naproxen,  etc.) °Piroxicam (Feldene) °Propafenone (Rythmol SR) °Propranolol (Inderal) °Isoniazid (INH) °Posaconazole (Noxafil) Barbiturates (Phenobarbital) °Carbamazepine (Tegretol) °Chlordiazepoxide (Librium) °Cholestyramine (Questran) °Griseofulvin °Oral Contraceptives °Rifampin °Sucralfate (Carafate) °Vitamin K  ° °Coumadin® (Warfarin) Major Herbal Interactions  °Increased Warfarin Effect Decreased Warfarin Effect   °Garlic °Ginseng °Ginkgo biloba Coenzyme Q10 °Green tea °St. John’s wort   ° °Coumadin® (Warfarin) FOOD Interactions  °Eat a consistent number of servings per week of foods HIGH in Vitamin K °(1 serving = ½ cup)  °Collards (cooked, or boiled & drained) °Kale (cooked, or boiled & drained) °Mustard greens (cooked, or boiled & drained) °Parsley *serving size only = ¼ cup °Spinach (cooked, or boiled & drained) °Swiss chard (cooked, or boiled & drained) °Turnip greens (cooked, or boiled & drained)  °Eat a consistent number of servings per week of foods MEDIUM-HIGH in Vitamin K °(1 serving = 1 cup)  °Asparagus (cooked, or boiled & drained) °Broccoli (cooked, boiled & drained, or raw & chopped) °Brussel sprouts (cooked, or boiled & drained) *serving size only = ½ cup °Lettuce, raw (green leaf, endive, romaine) °Spinach, raw °Turnip greens, raw & chopped  ° °These websites have more information on Coumadin (warfarin):  www.coumadin.com; °www.ahrq.gov/consumer/coumadin.htm; ° ° ° °

## 2016-10-05 NOTE — Progress Notes (Signed)
Physical Therapy Treatment Patient Details Name: Stephen Nixon MRN: [id number] DOB: 09-22-1941 Today's Date: 10/05/2016    History of Present Illness Pt s/p Bil TKR    PT Comments    Pt very motivated and progressing with mobility.  Epidural in place with good pain control but pt continues orthostatic this pm (marked improvement vs am with supine BP 117/69 and BP after walking around bed 94/49 but no c/o dizziness.  Follow Up Recommendations  CIR     Equipment Recommendations  Rolling walker with 5" wheels    Recommendations for Other Services OT consult     Precautions / Restrictions Precautions Precautions: Knee;Fall Precaution Comments: orthostatic on eval Required Braces or Orthoses: Knee Immobilizer - Right;Knee Immobilizer - Left Knee Immobilizer - Right: Discontinue once straight leg raise with < 10 degree lag Knee Immobilizer - Left: Discontinue once straight leg raise with < 10 degree lag Restrictions Weight Bearing Restrictions: No RLE Weight Bearing: Weight bearing as tolerated LLE Weight Bearing: Weight bearing as tolerated    Mobility  Bed Mobility Overal bed mobility: Needs Assistance;+2 for physical assistance;+ 2 for safety/equipment Bed Mobility: Sit to Supine     Supine to sit: Min assist;+2 for physical assistance;+2 for safety/equipment Sit to supine: Min assist;Mod assist;+2 for physical assistance;+2 for safety/equipment   General bed mobility comments: cues for sequence with physical assist to manage LEs and bring trunk to upright  Transfers Overall transfer level: Needs assistance Equipment used: Rolling walker (2 wheeled) Transfers: Sit to/from Stand Sit to Stand: Mod assist;+2 physical assistance;+2 safety/equipment;From elevated surface         General transfer comment: cues for walking LEs fwd and back and for use of UEs to self assist  Ambulation/Gait Ambulation/Gait assistance: Min assist;Mod assist;+2 physical assistance;+2  safety/equipment Ambulation Distance (Feet): 17 Feet Assistive device: Rolling walker (2 wheeled) Gait Pattern/deviations: Step-to pattern;Decreased step length - right;Decreased step length - left;Shuffle;Trunk flexed Gait velocity: decr Gait velocity interpretation: Below normal speed for age/gender General Gait Details: cues for sequence, posture and postion from RW.  Pt denies dizziness until returned to seated position.   Stairs            Wheelchair Mobility    Modified Rankin (Stroke Patients Only)       Balance                                    Cognition Arousal/Alertness: Awake/alert Behavior During Therapy: WFL for tasks assessed/performed Overall Cognitive Status: Within Functional Limits for tasks assessed                      Exercises Total Joint Exercises Ankle Circles/Pumps: AROM;Both;15 reps;Supine Quad Sets: AROM;Both;10 reps;Supine Heel Slides: AAROM;Both;10 reps;Supine Straight Leg Raises: AAROM;Both;10 reps;Supine    General Comments        Pertinent Vitals/Pain Pain Assessment: 0-10 Pain Score: 3  Pain Location: Bil knees Pain Descriptors / Indicators: Aching;Sore Pain Intervention(s): Limited activity within patient's tolerance;Monitored during session;Premedicated before session;Ice applied    Home Living Family/patient expects to be discharged to:: Private residence Living Arrangements: Spouse/significant other Available Help at Discharge: Family Type of Home: House Home Access: Stairs to enter   Home Layout: Able to live on main level with bedroom/bathroom Home Equipment: None      Prior Function Level of Independence: Independent          PT Goals (current goals  can now be found in the care plan section) Acute Rehab PT Goals Patient Stated Goal: Go home instead of CIR if I am doing well enough PT Goal Formulation: With patient Time For Goal Achievement: 10/10/16 Potential to Achieve Goals:  Good Progress towards PT goals: Progressing toward goals    Frequency    7X/week      PT Plan Current plan remains appropriate    Co-evaluation PT/OT/SLP Co-Evaluation/Treatment: Yes Reason for Co-Treatment: For patient/therapist safety PT goals addressed during session: Mobility/safety with mobility OT goals addressed during session: ADL's and self-care     End of Session Equipment Utilized During Treatment: Gait belt;Right knee immobilizer;Left knee immobilizer Activity Tolerance: Patient tolerated treatment well;Other (comment) (orthostatic but better than am - final BP 94/49) Patient left: in bed;with call bell/phone within reach;with family/visitor present     Time: 1345-1410 PT Time Calculation (min) (ACUTE ONLY): 25 min  Charges:  $Gait Training: 8-22 mins $Therapeutic Exercise: 8-22 mins                    G Codes:      Ahlijah Raia Oct 20, 2016, 2:20 PM

## 2016-10-05 NOTE — Evaluation (Signed)
Physical Therapy Evaluation Patient Details Name: Stephen Nixon MRN: [id number] DOB: 01/22/1941 Today's Date: 10/05/2016   History of Present Illness  Pt s/p Bil TKR  Clinical Impression  Pt s/p Bil TKR presents with decreased bil LE strength/ROM and post op pain limiting functional mobility.  Pt hopes to progress well enough to dc to home but realizes he may benefit from follow up rehab at CIR level to maximize IND for safe transition home.    Follow Up Recommendations CIR    Equipment Recommendations  Rolling walker with 5" wheels    Recommendations for Other Services OT consult     Precautions / Restrictions Precautions Precautions: Knee;Fall Precaution Comments: orthostatic on eval Required Braces or Orthoses: Knee Immobilizer - Right;Knee Immobilizer - Left Knee Immobilizer - Right: Discontinue once straight leg raise with < 10 degree lag Knee Immobilizer - Left: Discontinue once straight leg raise with < 10 degree lag Restrictions Weight Bearing Restrictions: No RLE Weight Bearing: Weight bearing as tolerated LLE Weight Bearing: Weight bearing as tolerated      Mobility  Bed Mobility Overal bed mobility: Needs Assistance;+2 for physical assistance;+ 2 for safety/equipment Bed Mobility: Supine to Sit     Supine to sit: Min assist;+2 for physical assistance;+2 for safety/equipment     General bed mobility comments: cues for sequence with physical assist to manage LEs and bring trunk to upright  Transfers Overall transfer level: Needs assistance Equipment used: Rolling walker (2 wheeled) Transfers: Sit to/from Stand Sit to Stand: Mod assist;+2 physical assistance;+2 safety/equipment;From elevated surface         General transfer comment: cues for walking LEs fwd and back and for use of UEs to self assist  Ambulation/Gait Ambulation/Gait assistance: Min assist;Mod assist;+2 physical assistance;+2 safety/equipment Ambulation Distance (Feet): 7  Feet Assistive device: Rolling walker (2 wheeled) Gait Pattern/deviations: Step-to pattern;Decreased step length - right;Decreased step length - left;Shuffle;Trunk flexed Gait velocity: decr Gait velocity interpretation: Below normal speed for age/gender General Gait Details: cues for sequence, posture and postion from RW.  Pt denies dizziness until returned to seated position.  Stairs            Wheelchair Mobility    Modified Rankin (Stroke Patients Only)       Balance                                             Pertinent Vitals/Pain Pain Assessment: 0-10 Pain Score: 3  Pain Location: Bil knees Pain Descriptors / Indicators: Aching;Sore Pain Intervention(s): Limited activity within patient's tolerance;Monitored during session;Premedicated before session;Ice applied;Other (comment) (epidural in place)    [place] expects to be discharged to:: Private residence Living Arrangements: Spouse/significant other Available Help at Discharge: Family Type of Home: House Home Access: Stairs to enter   Technical brewer of Steps: 1 Home Layout: Able to live on main level with bedroom/bathroom Home Equipment: None      Prior Function Level of Independence: Independent               Hand Dominance        Extremity/Trunk Assessment   Upper Extremity Assessment: Overall WFL for tasks assessed           Lower Extremity Assessment: RLE deficits/detail;LLE deficits/detail RLE Deficits / Details: 2+/5 quads with AAROM at knee -10 - 60 LLE Deficits / Details: 2+/5 quads with AAROM at  knee -10 - 50  Cervical / Trunk Assessment: Normal  Communication   Communication: No difficulties  Cognition Arousal/Alertness: Awake/alert Behavior During Therapy: WFL for tasks assessed/performed Overall Cognitive Status: Within Functional Limits for tasks assessed                      General Comments      Exercises Total  Joint Exercises Ankle Circles/Pumps: AROM;Both;15 reps;Supine Quad Sets: AROM;Both;10 reps;Supine Heel Slides: AAROM;Both;10 reps;Supine Straight Leg Raises: AAROM;Both;10 reps;Supine   Assessment/Plan    PT Assessment Patient needs continued PT services  PT Problem List Decreased strength;Decreased range of motion;Decreased activity tolerance;Decreased balance;Decreased mobility;Decreased knowledge of use of DME;Pain          PT Treatment Interventions DME instruction;Gait training;Stair training;Functional mobility training;Therapeutic activities;Therapeutic exercise;Patient/family education    PT Goals (Current goals can be found in the Care Plan section)  Acute Rehab PT Goals Patient Stated Goal: Go home instead of CIR if I am doing well enough PT Goal Formulation: With patient Time For Goal Achievement: 10/10/16 Potential to Achieve Goals: Good    Frequency 7X/week   Barriers to discharge        Co-evaluation               End of Session Equipment Utilized During Treatment: Gait belt;Right knee immobilizer;Left knee immobilizer Activity Tolerance: Patient tolerated treatment well;Other (comment) (orthostatic - BP on return to sitting 79/43 ) Patient left: in chair;with call bell/phone within reach;with nursing/sitter in room Nurse Communication: Mobility status         Time: [id number] PT Time Calculation (min) (ACUTE ONLY): 39 min   Charges:   PT Evaluation $PT Eval Low Complexity: 1 Procedure PT Treatments $Gait Training: 8-22 mins $Therapeutic Exercise: 8-22 mins   PT G Codes:        Stephen Nixon 2016-10-24, 12:48 PM

## 2016-10-05 NOTE — Care Management Note (Signed)
Case Management Note  Patient Details  Name: Yekusiel Vanotterloo MRN: [id number] Date of Birth: 1941-05-19  Subjective/Objective:  75 y.o. M admitted 10/04/2016 for Bilateral TKA. Awaiting PT evaluation later today. CM spoke with PA-C and pt to assess post op plan which seems to be HHPT vs CIR. Pt lives in [place] and would like referral to CIR at [facility]. CM spoke with Pam [phone]) admissions coordinator at [facility]. CM will Fax Facesheet, Op note, H and P and PT notes as available for review today. [phone]). Writer will not be CM for this pt on 10/27 so I will lv return call back number for Earvin Hansen, RN Lead CM for follow up.  Made Pam aware anticipated discharge is Monday 10/09/2016.                  Action/Plan: Will monitor for PT notes throughout the day.    Expected Discharge Date:                  Expected Discharge Plan:  [place] (pt lives in [place] and would like to go to CIR at [facility])  In-House Referral:  NA  Discharge planning Services  CM Consult  Post Acute Care Choice:  IP Rehab Choice offered to:  Patient  DME Arranged:    DME Agency:     HH Arranged:    [place] Agency:     Status of Service:  In process, will continue to follow  If discussed at Long Length of Stay Meetings, dates discussed:    Additional Comments:  Delrae Sawyers, RN 10/05/2016, 10:28 AM

## 2016-10-06 ENCOUNTER — Encounter (HOSPITAL_COMMUNITY): Payer: Self-pay | Admitting: Physical Medicine and Rehabilitation

## 2016-10-06 LAB — CBC
HEMATOCRIT: 30 % — AB (ref 39.0–52.0)
HEMOGLOBIN: 10.2 g/dL — AB (ref 13.0–17.0)
MCH: 31.4 pg (ref 26.0–34.0)
MCHC: 34 g/dL (ref 30.0–36.0)
MCV: 92.3 fL (ref 78.0–100.0)
Platelets: 229 10*3/uL (ref 150–400)
RBC: 3.25 MIL/uL — ABNORMAL LOW (ref 4.22–5.81)
RDW: 13.7 % (ref 11.5–15.5)
WBC: 19.6 10*3/uL — ABNORMAL HIGH (ref 4.0–10.5)

## 2016-10-06 LAB — PROTIME-INR
INR: 1.16
Prothrombin Time: 14.9 seconds (ref 11.4–15.2)

## 2016-10-06 LAB — BASIC METABOLIC PANEL
Anion gap: 7 (ref 5–15)
BUN: 17 mg/dL (ref 6–20)
CALCIUM: 8.3 mg/dL — AB (ref 8.9–10.3)
CHLORIDE: 103 mmol/L (ref 101–111)
CO2: 27 mmol/L (ref 22–32)
CREATININE: 0.71 mg/dL (ref 0.61–1.24)
GFR calc non Af Amer: >60 mL/min (ref >60)
GLUCOSE: 129 mg/dL — AB (ref 65–99)
Potassium: 3.5 mmol/L (ref 3.5–5.1)
Sodium: 137 mmol/L (ref 135–145)

## 2016-10-06 MED ORDER — POLYSACCHARIDE IRON COMPLEX 150 MG PO CAPS
150.0000 mg | ORAL_CAPSULE | Freq: Every day | ORAL | Status: DC
  Administered 2016-10-06 – 2016-10-07 (×2): 150 mg via ORAL
  Filled 2016-10-06 (×2): qty 1

## 2016-10-06 MED ORDER — ENOXAPARIN SODIUM 30 MG/0.3ML ~~LOC~~ SOLN
30.0000 mg | Freq: Two times a day (BID) | SUBCUTANEOUS | Status: DC
  Administered 2016-10-06: 30 mg via SUBCUTANEOUS
  Filled 2016-10-06: qty 0.3

## 2016-10-06 MED ORDER — WARFARIN SODIUM 4 MG PO TABS
4.0000 mg | ORAL_TABLET | Freq: Once | ORAL | Status: AC
  Administered 2016-10-06: 4 mg via ORAL
  Filled 2016-10-06: qty 1

## 2016-10-06 NOTE — Progress Notes (Signed)
Received notice from [facility] that they do not have any available beds for admission until after Monday. Discussed with patient, he would like to see if [facility] CIR was available. Contacted [facility] to discuss, she will review and see if they can take him.

## 2016-10-06 NOTE — Progress Notes (Signed)
Physical Therapy Treatment Patient Details Name: Stephen Nixon MRN: [id number] DOB: 1941-08-08 Today's Date: 10/06/2016    History of Present Illness Pt s/p Bil TKR    PT Comments    Steady progress but continues to fatigue easily.  Follow Up Recommendations  CIR     Equipment Recommendations  Rolling walker with 5" wheels    Recommendations for Other Services OT consult     Precautions / Restrictions Precautions Precautions: Knee;Fall Precaution Comments: orthostatic on eval Required Braces or Orthoses: Knee Immobilizer - Right;Knee Immobilizer - Left Knee Immobilizer - Right: Discontinue once straight leg raise with < 10 degree lag Knee Immobilizer - Left: Discontinue once straight leg raise with < 10 degree lag Restrictions Weight Bearing Restrictions: No RLE Weight Bearing: Weight bearing as tolerated LLE Weight Bearing: Weight bearing as tolerated    Mobility  Bed Mobility Overal bed mobility: Needs Assistance Bed Mobility: Sit to Supine     Supine to sit: Min assist;+2 for physical assistance;+2 for safety/equipment Sit to supine: Mod assist   General bed mobility comments: cues for sequence and assist for Bil LEs  Transfers Overall transfer level: Needs assistance Equipment used: Rolling walker (2 wheeled) Transfers: Sit to/from Stand Sit to Stand: Mod assist;+2 physical assistance;+2 safety/equipment         General transfer comment: cues for walking LEs fwd and back and for use of UEs to self assist  Ambulation/Gait Ambulation/Gait assistance: Min assist;+2 safety/equipment Ambulation Distance (Feet): 20 Feet Assistive device: Rolling walker (2 wheeled) Gait Pattern/deviations: Step-to pattern;Decreased step length - right;Decreased step length - left;Shuffle;Trunk flexed Gait velocity: decr Gait velocity interpretation: Below normal speed for age/gender General Gait Details: cues for sequence, posture and postion from RW.  Pt denies dizziness  until returned to seated position.   Stairs            Wheelchair Mobility    Modified Rankin (Stroke Patients Only)       Balance                                    Cognition Arousal/Alertness: Awake/alert Behavior During Therapy: WFL for tasks assessed/performed Overall Cognitive Status: Within Functional Limits for tasks assessed                      Exercises Total Joint Exercises Ankle Circles/Pumps: AROM;Both;15 reps;Supine Quad Sets: AROM;Both;10 reps;Supine Heel Slides: AAROM;Both;10 reps;Supine Straight Leg Raises: AAROM;Both;10 reps;Supine Goniometric ROM: AAROM R knee -10- 40, L knee -10-  35    General Comments        Pertinent Vitals/Pain Pain Assessment: 0-10 Pain Score: 4  Pain Location: Bil knees Pain Descriptors / Indicators: Aching;Sore Pain Intervention(s): Limited activity within patient's tolerance;Monitored during session;Premedicated before session;Ice applied    Home Living Family/patient expects to be discharged to:: Private residence Living Arrangements: Spouse/significant other Available Help at Discharge: Family                Prior Function            PT Goals (current goals can now be found in the care plan section) Acute Rehab PT Goals Patient Stated Goal: Go home instead of CIR if I am doing well enough PT Goal Formulation: With patient Time For Goal Achievement: 10/10/16 Potential to Achieve Goals: Good Progress towards PT goals: Progressing toward goals    Frequency    7X/week  PT Plan Current plan remains appropriate    Co-evaluation PT/OT/SLP Co-Evaluation/Treatment: Yes Reason for Co-Treatment: For patient/therapist safety PT goals addressed during session: Mobility/safety with mobility OT goals addressed during session: ADL's and self-care     End of Session Equipment Utilized During Treatment: Gait belt;Right knee immobilizer;Left knee immobilizer Activity  Tolerance: Patient tolerated treatment well Patient left: in bed;with call bell/phone within reach;with family/visitor present     Time: [id number] PT Time Calculation (min) (ACUTE ONLY): 20 min  Charges:  $Gait Training: 8-22 mins $Therapeutic Exercise: 23-37 mins                    G Codes:      Stephen Nixon 09-Oct-2016, 2:56 PM

## 2016-10-06 NOTE — Addendum Note (Signed)
Addendum  created 10/06/16 [id number] by Lauretta Grill, MD   Sign clinical note

## 2016-10-06 NOTE — Progress Notes (Signed)
Physical Therapy Treatment Patient Details Name: Stephen Nixon MRN: [id number] DOB: Nov 19, 1941 Today's Date: 10/06/2016    History of Present Illness Pt s/p Bil TKR    PT Comments    Pt continues motivated and progressing with mobility but ltd by fatigue and increased pain following epidural dc.  Follow Up Recommendations  CIR     Equipment Recommendations  Rolling walker with 5" wheels    Recommendations for Other Services OT consult     Precautions / Restrictions Precautions Precautions: Knee;Fall Precaution Comments: orthostatic on eval Required Braces or Orthoses: Knee Immobilizer - Right;Knee Immobilizer - Left Knee Immobilizer - Right: Discontinue once straight leg raise with < 10 degree lag Knee Immobilizer - Left: Discontinue once straight leg raise with < 10 degree lag Restrictions Weight Bearing Restrictions: No RLE Weight Bearing: Weight bearing as tolerated LLE Weight Bearing: Weight bearing as tolerated    Mobility  Bed Mobility Overal bed mobility: Needs Assistance;+2 for physical assistance;+ 2 for safety/equipment Bed Mobility: Sit to Supine     Supine to sit: Min assist;+2 for physical assistance;+2 for safety/equipment     General bed mobility comments: OOB deferred at pt request 2* fatigue  Transfers Overall transfer level: Needs assistance Equipment used: Rolling walker (2 wheeled) Transfers: Sit to/from Stand Sit to Stand: Min assist;Mod assist;+2 physical assistance;From elevated surface         General transfer comment: cues for walking LEs fwd and back and for use of UEs to self assist  Ambulation/Gait Ambulation/Gait assistance: Min assist;Mod assist;+2 safety/equipment Ambulation Distance (Feet): 20 Feet Assistive device: Rolling walker (2 wheeled) Gait Pattern/deviations: Step-to pattern;Decreased step length - right;Decreased step length - left;Shuffle;Trunk flexed Gait velocity: decr Gait velocity interpretation: Below  normal speed for age/gender General Gait Details: cues for sequence, posture and postion from RW.  Pt denies dizziness until returned to seated position.   Stairs            Wheelchair Mobility    Modified Rankin (Stroke Patients Only)       Balance                                    Cognition Arousal/Alertness: Awake/alert Behavior During Therapy: WFL for tasks assessed/performed Overall Cognitive Status: Within Functional Limits for tasks assessed                      Exercises Total Joint Exercises Ankle Circles/Pumps: AROM;Both;15 reps;Supine Quad Sets: AROM;Both;10 reps;Supine Heel Slides: AAROM;Both;10 reps;Supine Straight Leg Raises: AAROM;Both;10 reps;Supine Goniometric ROM: AAROM R knee -10- 40, L knee -10-  35    General Comments        Pertinent Vitals/Pain Pain Assessment: 0-10 Pain Score: 5  Pain Location: Bil knees Pain Descriptors / Indicators: Aching;Sore Pain Intervention(s): Limited activity within patient's tolerance;Monitored during session;Patient requesting pain meds-RN notified;Ice applied    Home Living Family/patient expects to be discharged to:: Private residence Living Arrangements: Spouse/significant other Available Help at Discharge: Family                Prior Function            PT Goals (current goals can now be found in the care plan section) Acute Rehab PT Goals Patient Stated Goal: Go home instead of CIR if I am doing well enough PT Goal Formulation: With patient Time For Goal Achievement: 10/10/16 Potential to Achieve Goals: Good Progress  towards PT goals: Progressing toward goals    Frequency    7X/week      PT Plan Current plan remains appropriate    Co-evaluation PT/OT/SLP Co-Evaluation/Treatment: Yes Reason for Co-Treatment: For patient/therapist safety PT goals addressed during session: Mobility/safety with mobility OT goals addressed during session: ADL's and  self-care     End of Session Equipment Utilized During Treatment: Gait belt;Right knee immobilizer;Left knee immobilizer Activity Tolerance: Patient tolerated treatment well Patient left: in chair;with call bell/phone within reach     Time: 1145-1211 PT Time Calculation (min) (ACUTE ONLY): 26 min  Charges:  $Gait Training: 8-22 mins $Therapeutic Exercise: 23-37 mins                    G Codes:      Stephen Nixon Oct 17, 2016, 1:08 PM

## 2016-10-06 NOTE — H&P (Signed)
Physical Medicine and Rehabilitation Admission H&P    CC: Bilateral Knee OA  HPI: Stephen Nixon is a 75 year old male with bilateral knee OA with pain and with failure of conservative therapy. He elected to undergo B-TKR on 10/04/16 by Dr. Wynelle Link. Post op WBAT and epidural removed on 10/27. On coumadin/lovenox bridge for DVT prophylaxis. Therapy ongoing and patient limited by pain and fatigue. Noted to have downward trend in H/H as well as leucocytosis. CIR recommended for follow up therapy.   Review of Systems  HENT: Negative for hearing loss.   Eyes: Negative for blurred vision and double vision.  Respiratory: Negative for cough and shortness of breath.   Cardiovascular: Negative for chest pain and palpitations.  Gastrointestinal: Positive for nausea. Negative for abdominal pain and heartburn.  Genitourinary: Negative for dysuria and urgency.  Musculoskeletal: Positive for joint pain and myalgias (aches around knee and calf).  Skin: Negative for itching and rash.  Neurological: Negative for dizziness, tingling, focal weakness and headaches.  Psychiatric/Behavioral: The patient is nervous/anxious and has insomnia.       Past Medical History:  Diagnosis Date  . Anemia 1990   Hairy cell keukemia  . Arthritis     Past Surgical History:  Procedure Laterality Date  . 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 TONSILLECTOMY    . TOTAL KNEE ARTHROPLASTY Bilateral 10/04/2016   Procedure: BILATERAL TOTAL KNEE ARTHROPLASTY;  Surgeon: Gaynelle Arabian, MD;  Location: [facility];  Service: Orthopedics;  Laterality: Bilateral;    History reviewed. No pertinent family history.    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 He reports that he quit smoking about 49 years ago. His smoking use included Cigarettes. He has never used smokeless tobacco. He reports that he drinks alcohol--couple of beers/week.  He reports that he does not use  drugs.    Allergies  Allergen Reactions  . Tylenol [Acetaminophen] Rash    Medications Prior to Admission  Medication Sig Dispense Refill  . aspirin 325 MG tablet Take 325 mg by mouth 2 (two) times daily.     Marland Kitchen Bioflavonoid Products (ESTER-C TR PO) Take 500 mg by mouth daily.    . Cyanocobalamin (VITAMIN B-12 PO) Take 1 tablet by mouth daily.    Marland Kitchen ibuprofen (ADVIL,MOTRIN) 200 MG tablet Take 200 mg by mouth every 6 (six) hours as needed (for pain.).    Marland Kitchen Melatonin 3 MG CAPS Take 3 mg by mouth at bedtime.     . traMADol (ULTRAM) 50 MG tablet TAKE 1 TABLET BY MOUTH EVERY 6 HOURS AS NEEDED (Patient not taking: Reported on 09/25/2016) 60 tablet 0    Home: Home Living Family/patient expects to be discharged to:: Private residence Living Arrangements: Spouse/significant other Available Help at Discharge: Family Type of Home: House Home Access: Stairs to enter Technical brewer of Steps: 1 Home Layout: Able to live on main level with bedroom/bathroom Bathroom Shower/Tub: Chiropodist: Handicapped height Home Equipment: None Additional Comments: pt installed a grab bar and has a counter next to comfort height commode   Functional History: Prior Function Level of Independence: Independent  Functional Status:  Mobility: Bed Mobility Overal bed mobility: Needs Assistance, +2 for physical assistance, + 2 for safety/equipment Bed Mobility: Sit to Supine Supine to sit: Min assist, +2 for physical assistance, +2 for safety/equipment Sit to supine: Min assist, Mod assist, +2 for physical assistance, +2 for safety/equipment General bed mobility  comments: OOB deferred at pt request 2* fatigue Transfers Overall transfer level: Needs assistance Equipment used: Rolling walker (2 wheeled) Transfers: Sit to/from Stand Sit to Stand: Min assist, Mod assist, +2 physical assistance, From elevated surface General transfer comment: cues for walking LEs fwd and back and for  use of UEs to self assist Ambulation/Gait Ambulation/Gait assistance: Min assist, Mod assist, +2 safety/equipment Ambulation Distance (Feet): 20 Feet Assistive device: Rolling walker (2 wheeled) Gait Pattern/deviations: Step-to pattern, Decreased step length - right, Decreased step length - left, Shuffle, Trunk flexed General Gait Details: cues for sequence, posture and postion from RW.  Pt denies dizziness until returned to seated position. Gait velocity: decr Gait velocity interpretation: Below normal speed for age/gender    ADL: ADL Overall ADL's : Needs assistance/impaired Toilet Transfer: Minimal assistance, +2 for safety/equipment, Ambulation (around bed) General ADL Comments: ambulated to hall and pt got sweaty--needed to sit.  Demonstrated tub transfer bench.  Pt is not ready to use AE at this time.    Cognition: Cognition Overall Cognitive Status: Within Functional Limits for tasks assessed Orientation Level: Oriented X4 Cognition Arousal/Alertness: Awake/alert Behavior During Therapy: WFL for tasks assessed/performed Overall Cognitive Status: Within Functional Limits for tasks assessed   Blood pressure (!) 141/81, pulse (!) 113, temperature (!) 100.8 F (38.2 C), temperature source Oral, resp. rate 18, height _0  (1.778 m), weight 73.8 kg (162 lb 12 oz), SpO2 97 %. Physical Exam  Nursing note and vitals reviewed. Constitutional: He is oriented to person, place, and time. He appears well-developed and well-nourished. No distress.  HENT:  Head: Normocephalic and atraumatic.  Mouth/Throat: Oropharynx is clear and moist.  Eyes: Conjunctivae are normal. Pupils are equal, round, and reactive to light.  Neck: Normal range of motion. Neck supple.  Cardiovascular: Normal rate and regular rhythm.   No murmur heard. Respiratory: Effort normal and breath sounds normal. No stridor. No respiratory distress. He has no wheezes.  GI: Soft. Bowel sounds are normal. He exhibits no  distension. There is no tenderness.  Musculoskeletal: He exhibits edema and tenderness.  Knee ROM 30-40 degrees.   Neurological: He is alert and oriented to person, place, and time. No cranial nerve deficit. Coordination normal.  UE 5/5. LE: cannot perform a straight leg lift. KE/KF 2/5. ADF/PF 4/5. No sensory findings. Cognitively appropriate.   Skin: Skin is warm and dry. He is not diaphoretic. No erythema.  Psychiatric: He has a normal mood and affect. His behavior is normal. Thought content normal.    Results for orders placed or performed during the hospital encounter of 10/04/16 (from the past 48 hour(s))  CBC     Status: Abnormal   Collection Time: 10/05/16  4:34 AM  Result Value Ref Range   WBC 17.6 (H) 4.0 - 10.5 K/uL   RBC 3.78 (L) 4.22 - 5.81 MIL/uL   Hemoglobin 11.9 (L) 13.0 - 17.0 g/dL   HCT 35.5 (L) 39.0 - 52.0 %   MCV 93.9 78.0 - 100.0 fL   MCH 31.5 26.0 - 34.0 pg   MCHC 33.5 30.0 - 36.0 g/dL   RDW 13.9 11.5 - 15.5 %   Platelets 277 150 - 400 K/uL  Basic metabolic panel     Status: Abnormal   Collection Time: 10/05/16  4:34 AM  Result Value Ref Range   Sodium 142 135 - 145 mmol/L   Potassium 4.0 3.5 - 5.1 mmol/L   Chloride 108 101 - 111 mmol/L   CO2 27 22 - 32 mmol/L  Glucose, Bld 138 (H) 65 - 99 mg/dL   BUN 16 6 - 20 mg/dL   Creatinine, Ser 0.89 0.61 - 1.24 mg/dL   Calcium 8.4 (L) 8.9 - 10.3 mg/dL   GFR calc non Af Amer >60 >60 mL/min   GFR calc Af Amer >60 >60 mL/min    Comment: (NOTE) The eGFR has been calculated using the CKD EPI equation. This calculation has not been validated in all clinical situations. eGFR's persistently <60 mL/min signify possible Chronic Kidney Disease.    Anion gap 7 5 - 15  Protime-INR     Status: None   Collection Time: 10/05/16  4:34 AM  Result Value Ref Range   Prothrombin Time 14.6 11.4 - 15.2 seconds   INR 1.14   CBC     Status: Abnormal   Collection Time: 10/06/16  4:53 AM  Result Value Ref Range   WBC 19.6 (H) 4.0  - 10.5 K/uL   RBC 3.25 (L) 4.22 - 5.81 MIL/uL   Hemoglobin 10.2 (L) 13.0 - 17.0 g/dL   HCT 30.0 (L) 39.0 - 52.0 %   MCV 92.3 78.0 - 100.0 fL   MCH 31.4 26.0 - 34.0 pg   MCHC 34.0 30.0 - 36.0 g/dL   RDW 13.7 11.5 - 15.5 %   Platelets 229 150 - 400 K/uL  Basic metabolic panel     Status: Abnormal   Collection Time: 10/06/16  4:53 AM  Result Value Ref Range   Sodium 137 135 - 145 mmol/L   Potassium 3.5 3.5 - 5.1 mmol/L   Chloride 103 101 - 111 mmol/L   CO2 27 22 - 32 mmol/L   Glucose, Bld 129 (H) 65 - 99 mg/dL   BUN 17 6 - 20 mg/dL   Creatinine, Ser 0.71 0.61 - 1.24 mg/dL   Calcium 8.3 (L) 8.9 - 10.3 mg/dL   GFR calc non Af Amer >60 >60 mL/min   GFR calc Af Amer >60 >60 mL/min    Comment: (NOTE) The eGFR has been calculated using the CKD EPI equation. This calculation has not been validated in all clinical situations. eGFR's persistently <60 mL/min signify possible Chronic Kidney Disease.    Anion gap 7 5 - 15  Protime-INR     Status: None   Collection Time: 10/06/16  4:53 AM  Result Value Ref Range   Prothrombin Time 14.9 11.4 - 15.2 seconds   INR 1.16    No results found.     Medical Problem List and Plan: 1.  Functional and mobility deficits secondary to endstage OA of bilateral knees s/p bilateral TKA's.  -admit to inpatient rehab today 2.  DVT Prophylaxis/Anticoagulation: Pharmaceutical: Coumadin and Lovenox 3. Pain Management: Encouraged to use robaxin for muscle spasms. Continue 10-15 oxycodone prn.  4. Mood: LCSW to follow for evaluation  And support.  5. Neuropsych: This patient is capable of making decisions on his own behalf. 6. Skin/Wound Care:  Routine pressure relief measures. Monitor wound for healing.  7. Fluids/Electrolytes/Nutrition: Monitor I/O. still 8. ABLA: On iron bid. Will recheck am.  9. Leucocytosis: Likely reactive. Monitor temps and other signs of infection. 10 Nausea:Will continue liquid diet for now. Will schedule miralax daily.       Post Admission Physician Evaluation: 1. Functional deficits secondary  to endstage OA bilateral knees/ bilateral TKA's. 2. Patient is admitted to receive collaborative, interdisciplinary care between the physiatrist, rehab nursing staff, and therapy team. 3. Patient's level of medical complexity and substantial therapy needs in  context of that medical necessity cannot be provided at a lesser intensity of care such as a SNF. 4. Patient has experienced substantial functional loss from his/her baseline which was documented above under the "Functional History" and "Functional Status" headings.  Judging by the patient's diagnosis, physical exam, and functional history, the patient has potential for functional progress which will result in measurable gains while on inpatient rehab.  These gains will be of substantial and practical use upon discharge  in facilitating mobility and self-care at the household level. 5. Physiatrist will provide 24 hour management of medical needs as well as oversight of the therapy plan/treatment and provide guidance as appropriate regarding the interaction of the two. 6. 24 hour rehab nursing will assist with bladder management, bowel management, safety, skin/wound care, disease management, medication administration, pain management and patient education  and help integrate therapy concepts, techniques,education, etc. 7. PT will assess and treat for/with: Lower extremity strength, range of motion, stamina, balance, functional mobility, safety, adaptive techniques and equipment, pain mgt, knee ROM.   Goals are: mod I. 8. OT will assess and treat for/with: ADL's, functional mobility, safety, upper extremity strength, adaptive techniques and equipment, pain mgt, ego support, community reintegration.   Goals are: mod I. Therapy may proceed with showering this patient. 9. SLP will assess and treat for/with: n/a.  Goals are: n/a. 10. Case Management and Social Worker will assess  and treat for psychological issues and discharge planning. 11. Team conference will be held weekly to assess progress toward goals and to determine barriers to discharge. 12. Patient will receive at least 3 hours of therapy per day at least 5 days per week. 13. ELOS: 7-8 days       14. Prognosis:  excellent     Meredith Staggers, MD, [place] [facility] 10/07/2016

## 2016-10-06 NOTE — Progress Notes (Signed)
Rehab admissions - I talked with patient and his wife about inpatient rehab.  They are in agreement to inpatient rehab here at [facility].  I have beds available tomorrow, Saturday.  If patient is medically ready, will plan to admit to acute inpatient rehab tomorrow, Saturday.  Call me for questions.  RC:[phone]

## 2016-10-06 NOTE — Progress Notes (Signed)
Privateer for Warfarin Indication: VTE prophylaxis  Allergies  Allergen Reactions  . Tylenol [Acetaminophen] Rash    Patient Measurements: Height: 5\' 10"  (177.8 cm) Weight: 162 lb 12 oz (73.8 kg) IBW/kg (Calculated) : 73 Heparin Dosing Weight:   Vital Signs: Temp: 100.8 F (38.2 C) (10/27 0558) Temp Source: Oral (10/27 0558) BP: 141/81 (10/27 0558) Pulse Rate: 113 (10/27 0558)  Labs:  Recent Labs  10/05/16 0434 10/06/16 0453  HGB 11.9* 10.2*  HCT 35.5* 30.0*  PLT 277 229  LABPROT 14.6 14.9  INR 1.14 1.16  CREATININE 0.89 0.71    Estimated Creatinine Clearance: 84.9 mL/min (by C-G formula based on SCr of 0.71 mg/dL).   Medical History: Past Medical History:  Diagnosis Date  . Anemia 1990   Hairy cell keukemia  . Arthritis    Assessment: 82 YOM with bilateral end stage knee osteoarthritis who underwent a bilateral TKA on 10/25. Pharmacy has been consulted for Warfarin dosing for VTE prophylaxis. Patient had ropivacaine epidural placed prior to surgery.  10/06/16  Today, the ropivacaine epidural was removed at 08:17. Yesterday, he received his first dose of Warfarin 4 mg at 1800. His INR today was 1.16. Hgb slightly trending down and WBC increasing (17.6 to 19.6), otherwise CBC is mostly stable. No signs of bleeding noted. Per ortho note, Lovenox will not be started until after 20:00, at least 12 hours after removal of epidural.  Goal of Therapy:  INR 2-3 Monitor platelets by anticoagulation protocol: Yes   Plan:  Give Warfarin 4 mg PO x 1 at 1800.  Education provided on 10/26. Lovenox 30mg  sq q12h to start tonight at 22:00 until INR therapeutic. Daily PT/INR and CBC.  Monitor for s/sx of bleeding.  Sallyanne Havers Student-PharmD 10/06/16 9:52 AM

## 2016-10-06 NOTE — PMR Pre-admission (Signed)
[place] [facility] PMR Admission Coordinator Pre-Admission Assessment  Patient: Stephen Nixon is an 75 y.o., male MRN: [id number] DOB: 1941-03-22 Height: 5\' 10"  (177.8 cm) Weight: 73.8 kg (162 lb 12 oz)  Insurance Information HMO: No   PPO:       PCP:       IPA:       80/20:       OTHER:   PRIMARY: Medicare A/B      Policy#: [id number]      Subscriber:  Meryl Crutch CM Name:        Phone#:       Fax#:   Pre-Cert#:        Employer: Retired Benefits:  Phone #:       Name: Checked in [place]. Date: 10/12/2007     Deduct:  $1316      Out of Pocket Max: None      Life Max: unlimited CIR: 100%      SNF: 100 days Outpatient: 80%     Co-Pay: 20% Home Health: 100%      Co-Pay: none DME: 80%     Co-Pay: 20% Providers: patient's choice  SECONDARY:  Tricare for Life      Policy#: [id number]      Subscriber:  Denita Lung CM Name:        Phone#:       Fax#:   Pre-Cert#:        Employer: Retired Benefits:  Phone #: [phone]     Name:   Eff. Date:       Deduct:        Out of Pocket Max:        Life Max:   CIR:        SNF:   Outpatient:       Co-Pay:   Home Health:        Co-Pay:   DME:       Co-Pay:    Emergency Contact Information Contact Information    Name Relation Home Work Mobile   Porrata,Jean Spouse   [phone]     Current Medical History  Patient Admitting Diagnosis: B TKR  History of Present Illness: A 75 year old male with bilateral knee OA with pain and with failure of conservative therapy. He elected to undergo B-TKR on 10/04/16 by Dr. Wynelle Link. Post op WBAT and epidural removed on 10/27. On coumadin/lovenox bridge for DVT prophylaxis. Therapy ongoing and patient limited by pain and fatigue. Noted to have downward trend in H/H as well as leucocytosis. CIR recommended for follow up therapy.   Patient's medical record from [facility] has been reviewed by the rehabilitation admission coordinator and physician.  Past Medical History  Past Medical  History:  Diagnosis Date  . Arthritis   . Closed fracture of left elbow with routine healing   . Hairy cell leukemia ([place]) 1990   due to agent orange.   . Impingement syndrome of left shoulder   . Measles   . Mumps     Family History   family history is not on file.  Prior Rehab/Hospitalizations Has the patient had major surgery during 100 days prior to admission? No   Current Medications  See MAR from [facility]  Patients Current Diet:  Regular diet, thin liquids  Precautions / Restrictions Precautions Precautions: Knee, Fall Precaution Comments: orthostatic on eval Restrictions Weight Bearing Restrictions: No RLE Weight Bearing: Weight bearing as tolerated LLE Weight Bearing: Weight bearing  as tolerated   Has the patient had 2 or more falls or a fall with injury in the past year?Yes.  Patient has fallen 1 time in the past year with left arm fracture 04/22/16 and facial injury.  Prior Activity Level Community (5-7x/wk): Went out daily, was driving.  Prior Functional Level Self Care: Did the patient need help bathing, dressing, using the toilet or eating?  Independent  Indoor Mobility: Did the patient need assistance with walking from room to room (with or without device)? Independent  Stairs: Did the patient need assistance with internal or external stairs (with or without device)? Independent  Functional Cognition: Did the patient need help planning regular tasks such as shopping or remembering to take medications? Independent  Home Assistive Devices / Equipment Home Assistive Devices/Equipment: Eyeglasses Home Equipment: None  Prior Device Use: Indicate devices/aids used by the patient prior to current illness, exacerbation or injury? None   Prior Functional Level Current Functional Level  Bed Mobility  Independent  Min assist   Transfers  Independent  Mod assist   Mobility - Walk/Wheelchair  Independent  Min assist (ambulated 20  feet RW)   Upper Body Dressing  Independent  Min assist   Lower Body Dressing  Independent  Max assist   Grooming  Independent  Min assist   Eating/Drinking  Independent  Other (Set up)   Toilet Transfer  Independent  Mod assist   Bladder Continence   Urinary catheter in place, to be removed evening 10/06/16  Urinary catheter to be removed pm 10/06/16   Bowel Management  WDL  Last BM 10/03/16   Stair Climbing     Other (Not tried.)   Insurance risk surveyor, appropriate   Memory  Intact  Intact   Cooking/Meal Prep  Independent      Housework  Independent    Money Management  Independent    Driving         Special needs/care consideration BiPAP/CPAP No CPM Yes, bilateral CPM Continuous Drip IV No  Dialysis No      Life Vest No Oxygen no Special Bed No Trach Size No Wound Vac (area) No     Skin Has bilateral TKR incisions                            Bowel mgmt: Last BM 10/03/16, currently is nauseated Bladder mgmt: Urinary catheter to be removed this pm, 10/06/16 Diabetic mgmt No  Previous Home Environment Living Arrangements: Spouse/significant other Available Help at Discharge: Family Type of Home: House Home Layout: Able to live on main level with bedroom/bathroom Home Access: Stairs to enter Technical brewer of Steps: 1 Bathroom Shower/Tub: Chiropodist: Handicapped height [place]: No Additional Comments: pt installed a grab bar and has a counter next to comfort height commode  Discharge [place] for Discharge Living Setting: Patient's home, House, Lives with (comment) (Lives with wife.) Type of Home at Discharge: House Discharge Home Layout: Two level, 1/2 bath on main level, Bed/bath upstairs (Plans to stay on first floor in an easy chair.) Alternate Level Stairs-Number of Steps: Flight Discharge Home Access: Stairs to enter Entrance Stairs-Number of Steps: 1 step  entry Does the patient have any problems obtaining your medications?: No  Social/Family/Support Systems Patient Roles: Spouse (Has a wife.) Contact Information: Jonatan Abila - wife Anticipated Caregiver: wife Anticipated Caregiver's Contact Information: Romie Minus - wife - [phone] Ability/Limitations of Caregiver: Wife is  retired and can assist. Careers adviser: 24/7 Discharge Plan Discussed with Primary Caregiver: Yes Is Caregiver In Agreement with Plan?: Yes Does Caregiver/Family have Issues with Lodging/Transportation while Pt is in Rehab?: No  Goals/Additional Needs Patient/Family Goal for Rehab: PT/OT mod I goals Expected length of stay: 5-7 days Cultural Considerations: None Dietary Needs: Regular diet, thin liquids Equipment Needs: TBD Pt/Family Agrees to Admission and willing to participate: Yes Program Orientation Provided & Reviewed with Pt/Caregiver Including Roles  & Responsibilities: Yes  Patient Condition: I have talked with patient and his wife while patient at [facility].  I have reviewed all notes and progress and have shared with rehab MD.  Patient has had a B TKR and he will benefit from an inpatient rehab stay.  He needs the coordinated approach to therapies to provide the best outcome for patient.  He can tolerate 3 hours of therapy a day.  I have approval from rehab MD for inpatient rehab admission for tomorrow, Saturday.  Preadmission Screen Completed By:  Retta Diones, 10/06/2016 3:31 PM ______________________________________________________________________   Discussed status with Dr. Naaman Plummer on 10/06/16 at 1530 and received telephone approval for admission tomorrow.  Admission Coordinator:  Retta Diones, time 1530/Date 10/06/16   Assessment/Plan: Diagnosis: OA bilateral knees 1. Does the need for close, 24 hr/day  Medical supervision in concert with the patient's rehab needs make it unreasonable for this patient to be served in  a less intensive setting? Yes 2. Co-Morbidities requiring supervision/potential complications: left shoulder impingement syndrome, ABLA 3. Due to bladder management, bowel management, safety, skin/wound care, disease management, medication administration, pain management and patient education, does the patient require 24 hr/day rehab nursing? Yes 4. Does the patient require coordinated care of a physician, rehab nurse, PT (1-2 hrs/day, 5 days/week) and OT (1-2 hrs/day, 5 days/week) to address physical and functional deficits in the context of the above medical diagnosis(es)? Yes Addressing deficits in the following areas: balance, endurance, locomotion, strength, transferring, bowel/bladder control, bathing, dressing, feeding, grooming, toileting and psychosocial support 5. Can the patient actively participate in an intensive therapy program of at least 3 hrs of therapy 5 days a week? Yes 6. The potential for patient to make measurable gains while on inpatient rehab is excellent 7. Anticipated functional outcomes upon discharge from inpatients are: modified independent PT, modified independent OT, n/a SLP 8. Estimated rehab length of stay to reach the above functional goals is: 5-7 days 9. Does the patient have adequate social supports to accommodate these discharge functional goals? Yes 10. Anticipated D/C setting: Home 11. Anticipated post D/C treatments: [place] therapy 12. Overall Rehab/Functional Prognosis: excellent    RECOMMENDATIONS: This patient's condition is appropriate for continued rehabilitative care in the following setting: CIR Patient has agreed to participate in recommended program. Yes Note that insurance prior authorization may be required for reimbursement for recommended care.  Comment: Appropriate for inpatient rehab.   Meredith Staggers, MD, [place] [facility] 10/06/2016   Retta Diones 10/06/2016

## 2016-10-06 NOTE — Anesthesia Postprocedure Evaluation (Signed)
Anesthesia Post Note  Patient: Stephen Nixon  Procedure(s) Performed: Procedure(s) (LRB): BILATERAL TOTAL KNEE ARTHROPLASTY (Bilateral)  Patient location during evaluation: PACU Anesthesia Type: Combined Spinal/Epidural Level of consciousness: awake and alert Pain management: pain level controlled Vital Signs Assessment: post-procedure vital signs reviewed and stable Respiratory status: spontaneous breathing Cardiovascular status: stable Postop Assessment: no signs of nausea or vomiting Anesthetic complications: no    Last Vitals:  Vitals:   10/06/16 0438 10/06/16 0558  BP:  (!) 141/81  Pulse:  (!) 113  Resp: 18 18  Temp:  (!) 38.2 C    Last Pain:  Vitals:   10/06/16 0630  TempSrc:   PainSc: Asleep                 Gordie Belvin

## 2016-10-06 NOTE — Progress Notes (Signed)
Subjective: 2 Days Post-Op Procedure(s) (LRB): BILATERAL TOTAL KNEE ARTHROPLASTY (Bilateral) Patient reports pain as mild.   Patient seen in rounds for Dr. Wynelle Link.  Slight nausea last night.  Main complaint is right thigh pain from the tourniquet. Patient is well, but has had some minor complaints of pain in the knees, requiring pain medications Epidural to come out today per Anesthesia.  Anticipate possible increase in pain temporarily after the epidural has been removed. Plan is to go Rehab at [facility] after hospital stay.  Objective: Vital signs in last 24 hours: Temp:  [98.4 F (36.9 C)-100.8 F (38.2 C)] 100.8 F (38.2 C) (10/27 0558) Pulse Rate:  [76-113] 113 (10/27 0558) Resp:  [18] 18 (10/27 0558) BP: (79-149)/(43-81) 141/81 (10/27 0558) SpO2:  [93 %-99 %] 97 % (10/27 0558)  Intake/Output from previous day:  Intake/Output Summary (Last 24 hours) at 10/06/16 0752 Last data filed at 10/06/16 0600  Gross per 24 hour  Intake             1500 ml  Output             1950 ml  Net             -450 ml    Intake/Output this shift: No intake/output data recorded.  Labs:  Recent Labs  10/05/16 0434 10/06/16 0453  HGB 11.9* 10.2*    Recent Labs  10/05/16 0434 10/06/16 0453  WBC 17.6* 19.6*  RBC 3.78* 3.25*  HCT 35.5* 30.0*  PLT 277 229    Recent Labs  10/05/16 0434 10/06/16 0453  NA 142 137  K 4.0 3.5  CL 108 103  BUN 16 17  CREATININE 0.89 0.71  GLUCOSE 138* 129*  CALCIUM 8.4* 8.3*    Recent Labs  10/05/16 0434 10/06/16 0453  INR 1.14 1.16    EXAM General - Patient is Alert, Appropriate and Oriented Extremity - Neurovascular intact Sensation intact distally Dorsiflexion/Plantar flexion intact Dressing/Incision - clean, dry, no drainage, healing Motor Function - intact, moving feet and toes well on exam.    Past Medical History:  Diagnosis Date  . Anemia 1990   Hairy cell keukemia  . Arthritis     Assessment/Plan: 2 Days  Post-Op Procedure(s) (LRB): BILATERAL TOTAL KNEE ARTHROPLASTY (Bilateral) Principal Problem:   Primary osteoarthritis of both knees Active Problems:   OA (osteoarthritis) of knee  Estimated body mass index is 23.35 kg/m as calculated from the following:   Height as of this encounter: 5\' 10"  (1.778 m).   Weight as of this encounter: 73.8 kg (162 lb 12 oz). Up with therapy Continue foley due to urinary output monitoring and they still has their epidural in place.  Will remove the catheter six to eight hours after the epidural is removed.  Will need to note in chart when the epidural is pulled.  DVT Prophylaxis - Lovenox and Coumadin, Lovenox will not start until later this afternoon though after the epidural has been removed for twelve hours.  Will need to note in chart when the epidural is pulled. Anticipate possible increase in pain temporarily after the epidural has been removed.  Weight-Bearing as tolerated to both legs  Take Coumadin for four weeks and then discontinue.  The dose may need to be adjusted based upon the INR.  Please follow the INR and titrate Coumadin dose for a therapeutic range between 2.0 and 3.0 INR.  After completing the four weeks of Coumadin, the patient may stop the Coumadin and resume  their 325 mg Aspirin daily.  Lovenox injections will start later this evening after the epidural has been removed and continue until the INR is therapeutic at or greater than 2.0.  When INR reaches the therapeutic level of equal to or greater than 2.0, the patient may discontinue the Lovenox injections.  Arlee Muslim, PA-C Orthopaedic Surgery 10/06/2016, 7:52 AM

## 2016-10-06 NOTE — Progress Notes (Signed)
Occupational Therapy Treatment Patient Details Name: Stephen Nixon MRN: [id number] DOB: 1941-04-20 Today's Date: 10/06/2016    History of present illness Pt s/p Bil TKR   OT comments  Epidural out today.  Educated on tub bench.  Need to further assess toilet on next visit.  Follow Up Recommendations  Supervision/Assistance - 24 hour    Equipment Recommendations   (possibly 3:1--to be further assessed)    Recommendations for Other Services      Precautions / Restrictions Precautions Precautions: Knee;Fall Required Braces or Orthoses: Knee Immobilizer - Right;Knee Immobilizer - Left Knee Immobilizer - Right: Discontinue once straight leg raise with < 10 degree lag Knee Immobilizer - Left: Discontinue once straight leg raise with < 10 degree lag Restrictions Weight Bearing Restrictions: No RLE Weight Bearing: Weight bearing as tolerated LLE Weight Bearing: Weight bearing as tolerated       Mobility Bed Mobility               General bed mobility comments: performed by PT  Transfers   Equipment used: Rolling walker (2 wheeled)   Sit to Stand: Min assist;Mod assist;+2 physical assistance;From elevated surface         General transfer comment: cues for walking LEs fwd and back and for use of UEs to self assist    Balance                                   ADL                                         General ADL Comments: ambulated to hall and pt got sweaty--needed to sit.  Demonstrated tub transfer bench.  Pt is not ready to use AE at this time.        Vision                     Perception     Praxis      Cognition   Behavior During Therapy: WFL for tasks assessed/performed Overall Cognitive Status: Within Functional Limits for tasks assessed                       Extremity/Trunk Assessment               Exercises     Shoulder Instructions       General Comments      Pertinent  Vitals/ Pain       Pain Score:  (5-8) Pain Location: bil knees Pain Descriptors / Indicators: Aching Pain Intervention(s): Limited activity within patient's tolerance;Monitored during session;Repositioned;Patient requesting pain meds-RN notified;Ice applied  Home Living Family/patient expects to be discharged to:: Private residence Living Arrangements: Spouse/significant other Available Help at Discharge: Family                                    Prior Functioning/Environment              Frequency  Min 2X/week        Progress Toward Goals  OT Goals(current goals can now be found in the care plan section)  Progress towards OT goals: Progressing toward goals     Plan      Co-evaluation  Reason for Co-Treatment: For patient/therapist safety PT goals addressed during session: Mobility/safety with mobility OT goals addressed during session: ADL's and self-care      End of Session CPM Left Knee CPM Left Knee: Off CPM Right Knee CPM Right Knee: Off   Activity Tolerance Patient tolerated treatment well   Patient Left in chair;with call bell/phone within reach   Nurse Communication Patient requests pain meds (swelling LUE, ? infiltrated)        Time: [id number] OT Time Calculation (min): 24 min  Charges: OT General Charges $OT Visit: 1 Procedure OT Treatments $Therapeutic Activity: 8-22 mins  Ashwath Lasch 10/06/2016, 12:43 PM  Lesle Chris, OTR/L [phone] 10/06/2016

## 2016-10-06 NOTE — Progress Notes (Signed)
Rehab admissions - noted patient now interested in inpatient rehab admission here at [facility] since [facility] cannot accommodate patient.  I do have beds available tomorrow and will plan admit to acute inpatient rehab tomorrow if patient is medically stable.  Call me for questions.  [phone]

## 2016-10-06 NOTE — Progress Notes (Signed)
Physical Therapy Treatment Patient Details Name: Stephen Nixon MRN: [id number] DOB: 28-May-1941 Today's Date: 10/06/2016    History of Present Illness Pt s/p Bil TKR    PT Comments    Therex performed but OOB deferred to later 2* pt fatigue  Follow Up Recommendations  CIR     Equipment Recommendations  Rolling walker with 5" wheels    Recommendations for Other Services OT consult     Precautions / Restrictions Precautions Precautions: Knee;Fall Precaution Comments: orthostatic on eval Required Braces or Orthoses: Knee Immobilizer - Right;Knee Immobilizer - Left Knee Immobilizer - Right: Discontinue once straight leg raise with < 10 degree lag Knee Immobilizer - Left: Discontinue once straight leg raise with < 10 degree lag Restrictions Weight Bearing Restrictions: No RLE Weight Bearing: Weight bearing as tolerated LLE Weight Bearing: Weight bearing as tolerated    Mobility  Bed Mobility               General bed mobility comments: OOB deferred at pt request 2* fatigue  Transfers   Equipment used: Rolling walker (2 wheeled)   Sit to Stand: Min assist;Mod assist;+2 physical assistance;From elevated surface         General transfer comment: cues for walking LEs fwd and back and for use of UEs to self assist  Ambulation/Gait                 Stairs            Wheelchair Mobility    Modified Rankin (Stroke Patients Only)       Balance                                    Cognition Arousal/Alertness: Awake/alert Behavior During Therapy: WFL for tasks assessed/performed Overall Cognitive Status: Within Functional Limits for tasks assessed                      Exercises Total Joint Exercises Ankle Circles/Pumps: AROM;Both;15 reps;Supine Quad Sets: AROM;Both;10 reps;Supine Heel Slides: AAROM;Both;10 reps;Supine Straight Leg Raises: AAROM;Both;10 reps;Supine Goniometric ROM: AAROM R knee -10- 40, L knee -10-   35    General Comments        Pertinent Vitals/Pain Pain Assessment: 0-10 Pain Score: 5  Pain Location: bil knees Pain Descriptors / Indicators: Aching;Sore Pain Intervention(s): Limited activity within patient's tolerance;Monitored during session;Premedicated before session;Ice applied    Home Living Family/patient expects to be discharged to:: Private residence Living Arrangements: Spouse/significant other Available Help at Discharge: Family                Prior Function            PT Goals (current goals can now be found in the care plan section) Acute Rehab PT Goals Patient Stated Goal: Go home instead of CIR if I am doing well enough PT Goal Formulation: With patient Time For Goal Achievement: 10/10/16 Potential to Achieve Goals: Good Progress towards PT goals: Progressing toward goals    Frequency    7X/week      PT Plan Current plan remains appropriate    Co-evaluation   Reason for Co-Treatment: For patient/therapist safety PT goals addressed during session: Mobility/safety with mobility OT goals addressed during session: ADL's and self-care     End of Session   Activity Tolerance: Patient limited by fatigue Patient left: in bed;with call bell/phone within reach;with family/visitor present  Time: [id number] PT Time Calculation (min) (ACUTE ONLY): 23 min  Charges:  $Therapeutic Exercise: 23-37 mins                    G Codes:      Jennifier Smitherman 10-18-2016, 1:04 PM

## 2016-10-06 NOTE — Progress Notes (Signed)
Epidural removed. Tip intact. Anticoagulation and labs reviewed. Ortho aware.

## 2016-10-07 ENCOUNTER — Inpatient Hospital Stay (HOSPITAL_COMMUNITY)
Admission: RE | Admit: 2016-10-07 | Discharge: 2016-10-17 | DRG: 560 | Disposition: A | Discharge status: Home / Self Care | Payer: Medicare Other | Source: Intra-hospital | Attending: Physical Medicine & Rehabilitation | Admitting: Physical Medicine & Rehabilitation

## 2016-10-07 DIAGNOSIS — D72829 Elevated white blood cell count, unspecified: Secondary | ICD-10-CM

## 2016-10-07 DIAGNOSIS — E876 Hypokalemia: Secondary | ICD-10-CM

## 2016-10-07 DIAGNOSIS — L270 Generalized skin eruption due to drugs and medicaments taken internally: Secondary | ICD-10-CM | POA: Diagnosis not present

## 2016-10-07 DIAGNOSIS — Z87891 Personal history of nicotine dependence: Secondary | ICD-10-CM

## 2016-10-07 DIAGNOSIS — D72820 Lymphocytosis (symptomatic): Secondary | ICD-10-CM | POA: Diagnosis not present

## 2016-10-07 DIAGNOSIS — Z886 Allergy status to analgesic agent status: Secondary | ICD-10-CM

## 2016-10-07 DIAGNOSIS — R609 Edema, unspecified: Secondary | ICD-10-CM | POA: Diagnosis not present

## 2016-10-07 DIAGNOSIS — Z9081 Acquired absence of spleen: Secondary | ICD-10-CM

## 2016-10-07 DIAGNOSIS — Z79899 Other long term (current) drug therapy: Secondary | ICD-10-CM

## 2016-10-07 DIAGNOSIS — Z7982 Long term (current) use of aspirin: Secondary | ICD-10-CM | POA: Diagnosis not present

## 2016-10-07 DIAGNOSIS — R509 Fever, unspecified: Secondary | ICD-10-CM | POA: Diagnosis present

## 2016-10-07 DIAGNOSIS — M170 Bilateral primary osteoarthritis of knee: Secondary | ICD-10-CM | POA: Diagnosis not present

## 2016-10-07 DIAGNOSIS — Z96653 Presence of artificial knee joint, bilateral: Secondary | ICD-10-CM

## 2016-10-07 DIAGNOSIS — R110 Nausea: Secondary | ICD-10-CM | POA: Diagnosis present

## 2016-10-07 DIAGNOSIS — R5082 Postprocedural fever: Secondary | ICD-10-CM | POA: Diagnosis not present

## 2016-10-07 DIAGNOSIS — N390 Urinary tract infection, site not specified: Secondary | ICD-10-CM | POA: Diagnosis present

## 2016-10-07 DIAGNOSIS — Z471 Aftercare following joint replacement surgery: Secondary | ICD-10-CM | POA: Diagnosis present

## 2016-10-07 DIAGNOSIS — K5903 Drug induced constipation: Secondary | ICD-10-CM

## 2016-10-07 DIAGNOSIS — D62 Acute posthemorrhagic anemia: Secondary | ICD-10-CM | POA: Diagnosis present

## 2016-10-07 LAB — URINE MICROSCOPIC-ADD ON

## 2016-10-07 LAB — CBC
HCT: 28.9 % — ABNORMAL LOW (ref 39.0–52.0)
Hemoglobin: 9.9 g/dL — ABNORMAL LOW (ref 13.0–17.0)
MCH: 31.5 pg (ref 26.0–34.0)
MCHC: 34.3 g/dL (ref 30.0–36.0)
MCV: 92 fL (ref 78.0–100.0)
PLATELETS: 215 10*3/uL (ref 150–400)
RBC: 3.14 MIL/uL — AB (ref 4.22–5.81)
RDW: 13.6 % (ref 11.5–15.5)
WBC: 16.5 10*3/uL — AB (ref 4.0–10.5)

## 2016-10-07 LAB — URINALYSIS, ROUTINE W REFLEX MICROSCOPIC
BILIRUBIN URINE: NEGATIVE
GLUCOSE, UA: NEGATIVE mg/dL
Ketones, ur: 15 mg/dL — AB
Leukocytes, UA: NEGATIVE
Nitrite: NEGATIVE
PROTEIN: 30 mg/dL — AB
Specific Gravity, Urine: 1.024 (ref 1.005–1.030)
pH: 6 (ref 5.0–8.0)

## 2016-10-07 LAB — PROTIME-INR
INR: 2.36
PROTHROMBIN TIME: 26.2 s — AB (ref 11.4–15.2)

## 2016-10-07 MED ORDER — ONDANSETRON HCL 4 MG/2ML IJ SOLN: 4.0000 mg | Freq: Four times a day (QID) | INTRAMUSCULAR | Status: DC | PRN

## 2016-10-07 MED ORDER — ONDANSETRON HCL 4 MG PO TABS: 4.0000 mg | ORAL_TABLET | Freq: Four times a day (QID) | ORAL | Status: DC | PRN

## 2016-10-07 MED ORDER — PHENOL 1.4 % MT LIQD: 1.0000 | OROMUCOSAL | Status: DC | PRN

## 2016-10-07 MED ORDER — POLYSACCHARIDE IRON COMPLEX 150 MG PO CAPS
150.0000 mg | ORAL_CAPSULE | Freq: Every day | ORAL | Status: DC
  Administered 2016-10-07 – 2016-10-16 (×10): 150 mg via ORAL
  Filled 2016-10-07 (×10): qty 1

## 2016-10-07 MED ORDER — DOCUSATE SODIUM 100 MG PO CAPS
100.0000 mg | ORAL_CAPSULE | Freq: Two times a day (BID) | ORAL | Status: DC
  Administered 2016-10-07 – 2016-10-09 (×4): 100 mg via ORAL
  Filled 2016-10-07 (×4): qty 1

## 2016-10-07 MED ORDER — METHOCARBAMOL 500 MG PO TABS: 500.0000 mg | ORAL_TABLET | Freq: Four times a day (QID) | ORAL | 1 refills | Status: DC | PRN

## 2016-10-07 MED ORDER — FLEET ENEMA 7-19 GM/118ML RE ENEM: 1.0000 | ENEMA | Freq: Once | RECTAL | Status: DC | PRN

## 2016-10-07 MED ORDER — POLYSACCHARIDE IRON COMPLEX 150 MG PO CAPS: 150.0000 mg | ORAL_CAPSULE | Freq: Every day | ORAL | 1 refills | Status: DC

## 2016-10-07 MED ORDER — GUAIFENESIN-DM 100-10 MG/5ML PO SYRP: 5.0000 mL | ORAL_SOLUTION | Freq: Four times a day (QID) | ORAL | Status: DC | PRN

## 2016-10-07 MED ORDER — DIPHENHYDRAMINE HCL 12.5 MG/5ML PO ELIX: 12.5000 mg | ORAL_SOLUTION | ORAL | Status: DC | PRN

## 2016-10-07 MED ORDER — OXYCODONE HCL 5 MG PO TABS
10.0000 mg | ORAL_TABLET | ORAL | Status: DC | PRN
  Administered 2016-10-07: 10 mg via ORAL
  Administered 2016-10-07: 15 mg via ORAL
  Administered 2016-10-08: 10 mg via ORAL
  Administered 2016-10-08 – 2016-10-09 (×3): 15 mg via ORAL
  Filled 2016-10-07 (×3): qty 3
  Filled 2016-10-07: qty 2
  Filled 2016-10-07 (×2): qty 3

## 2016-10-07 MED ORDER — TRAZODONE HCL 50 MG PO TABS
25.0000 mg | ORAL_TABLET | Freq: Every evening | ORAL | Status: DC | PRN
  Administered 2016-10-14 – 2016-10-16 (×3): 50 mg via ORAL
  Filled 2016-10-07 (×3): qty 1

## 2016-10-07 MED ORDER — POLYETHYLENE GLYCOL 3350 17 G PO PACK
17.0000 g | PACK | Freq: Every day | ORAL | Status: DC
  Administered 2016-10-07 – 2016-10-09 (×3): 17 g via ORAL
  Filled 2016-10-07 (×3): qty 1

## 2016-10-07 MED ORDER — WARFARIN SODIUM 2 MG PO TABS: 2.0000 mg | ORAL_TABLET | Freq: Once | ORAL | Status: DC

## 2016-10-07 MED ORDER — MENTHOL 3 MG MT LOZG
1.0000 | LOZENGE | OROMUCOSAL | Status: DC | PRN
  Filled 2016-10-07: qty 9

## 2016-10-07 MED ORDER — WARFARIN SODIUM 2 MG PO TABS
2.0000 mg | ORAL_TABLET | Freq: Once | ORAL | Status: AC
  Administered 2016-10-07: 2 mg via ORAL
  Filled 2016-10-07: qty 1

## 2016-10-07 MED ORDER — BISACODYL 10 MG RE SUPP: 10.0000 mg | Freq: Every day | RECTAL | Status: DC | PRN

## 2016-10-07 MED ORDER — ALUM & MAG HYDROXIDE-SIMETH 200-200-20 MG/5ML PO SUSP: 30.0000 mL | ORAL | Status: DC | PRN

## 2016-10-07 MED ORDER — WARFARIN - PHARMACIST DOSING INPATIENT: Freq: Every day | Status: DC

## 2016-10-07 MED ORDER — CEPHALEXIN 250 MG PO CAPS
250.0000 mg | ORAL_CAPSULE | Freq: Four times a day (QID) | ORAL | Status: DC
  Administered 2016-10-08 – 2016-10-09 (×7): 250 mg via ORAL
  Filled 2016-10-07 (×8): qty 1

## 2016-10-07 MED ORDER — METHOCARBAMOL 500 MG PO TABS
500.0000 mg | ORAL_TABLET | Freq: Four times a day (QID) | ORAL | Status: DC | PRN
  Administered 2016-10-10 – 2016-10-16 (×10): 500 mg via ORAL
  Filled 2016-10-07 (×10): qty 1

## 2016-10-07 MED ORDER — TRAMADOL HCL 50 MG PO TABS
50.0000 mg | ORAL_TABLET | Freq: Four times a day (QID) | ORAL | Status: DC | PRN
  Administered 2016-10-10 – 2016-10-16 (×6): 100 mg via ORAL
  Filled 2016-10-07 (×7): qty 2

## 2016-10-07 MED ORDER — OXYCODONE HCL 5 MG PO TABS: 5.0000 mg | ORAL_TABLET | ORAL | 0 refills | Status: DC | PRN

## 2016-10-07 MED ORDER — ENOXAPARIN SODIUM 30 MG/0.3ML ~~LOC~~ SOLN: 30.0000 mg | Freq: Two times a day (BID) | SUBCUTANEOUS | Status: DC

## 2016-10-07 MED ORDER — CEPHALEXIN 250 MG PO CAPS
250.0000 mg | ORAL_CAPSULE | Freq: Once | ORAL | Status: AC
  Administered 2016-10-07: 250 mg via ORAL

## 2016-10-07 MED ORDER — DIPHENHYDRAMINE HCL 12.5 MG/5ML PO ELIX
12.5000 mg | ORAL_SOLUTION | Freq: Four times a day (QID) | ORAL | Status: DC | PRN
  Administered 2016-10-16: 25 mg via ORAL
  Filled 2016-10-07: qty 10

## 2016-10-07 NOTE — Progress Notes (Signed)
Dr. Naaman Plummer notified of admission and current vital signs.  100.6-108-18  118/71.   Patient denies any burning or urgency when voiding.  Lungs are clear.  Encouraged to continue to use incentive spirometry and to take deep breaths and cough.  Knees are swollen and warm to touch.  No drainage noted other than a small amount of blood on gauze on l knee.  Patient offers knees are "about the same" as they have been.

## 2016-10-07 NOTE — Progress Notes (Signed)
[place] for Warfarin Indication: VTE prophylaxis  Allergies  Allergen Reactions  . Tylenol [Acetaminophen] Rash    Patient Measurements: Height: 5\' 10"  (177.8 cm) Weight: 162 lb 12 oz (73.8 kg) IBW/kg (Calculated) : 73  Vital Signs: Temp: 99.6 F (37.6 C) (10/28 0655) Temp Source: Oral (10/28 0655) BP: 138/73 (10/28 0655) Pulse Rate: 104 (10/28 0655)  Labs:  Recent Labs  10/05/16 0434 10/06/16 0453 10/07/16 0458  HGB 11.9* 10.2* 9.9*  HCT 35.5* 30.0* 28.9*  PLT 277 229 215  LABPROT 14.6 14.9 26.2*  INR 1.14 1.16 2.36  CREATININE 0.89 0.71  --     Estimated Creatinine Clearance: 84.9 mL/min (by C-G formula based on SCr of 0.71 mg/dL).   Medical History: Past Medical History:  Diagnosis Date  . Arthritis   . Closed fracture of left elbow with routine healing   . Hairy cell leukemia ([place]) 1990   due to agent orange.   . Impingement syndrome of left shoulder   . Measles   . Mumps    Assessment: 66 YOM with bilateral end stage knee osteoarthritis who underwent a bilateral TKA on 10/25. Pharmacy has been consulted for Warfarin dosing for VTE prophylaxis. Patient had ropivacaine epidural placed prior to surgery.  Significant events: 10/27: ropivacaine epidural removed at 08:17; lovenox started > 12 hr after catheter removal  Today, 10/07/2016:  CBC: Hgb down as expected postop; Plt wnl  INR therapeutic; sharply increased from yesterday; repeated lab (on original tube, not new blood draw) consistent with original  Major drug interactions: none  No bleeding issues per nursing  Eating 50-100% of meals  Goal of Therapy:  INR 2-3 Monitor platelets by anticoagulation protocol: Yes  Plan:   Give Warfarin 2 mg PO x 1 at 1800.   Lovenox stopped by Ortho with INR > 2  Daily PT/INR and CBC.   Monitor for s/sx of bleeding.  Reuel Boom, PharmD, BCPS Pager: [phone] 10/07/2016, 9:27  AM

## 2016-10-07 NOTE — Progress Notes (Signed)
Orthopedic Tech Progress Note Patient Details:  Stephen Nixon 1941-03-22 [id number] Put patient in CPM's at 1750. CPM Left Knee CPM Left Knee: On Left Knee Flexion (Degrees): 45 Left Knee Extension (Degrees): 10 CPM Right Knee CPM Right Knee: On Right Knee Flexion (Degrees): 45 Right Knee Extension (Degrees): [address]   Stephen Nixon 10/07/2016, 6:48 PM

## 2016-10-07 NOTE — Progress Notes (Signed)
Subjective: 3 Days Post-Op Procedure(s) (LRB): BILATERAL TOTAL KNEE ARTHROPLASTY (Bilateral) Patient reports pain as 3 on 0-10 scale.    Objective: Vital signs in last 24 hours: Temp:  [98.4 F (36.9 C)-99.6 F (37.6 C)] 99.6 F (37.6 C) (10/28 0655) Pulse Rate:  [102-109] 104 (10/28 0655) Resp:  [16] 16 (10/28 0655) BP: (138-141)/(72-73) 138/73 (10/28 0655) SpO2:  [97 %-98 %] 98 % (10/28 0655)  Intake/Output from previous day: 10/27 0701 - 10/28 0700 In: 1080 [P.O.:1080] Out: 675 [Urine:675] Intake/Output this shift: No intake/output data recorded.   Recent Labs  10/05/16 0434 10/06/16 0453 10/07/16 0458  HGB 11.9* 10.2* 9.9*    Recent Labs  10/06/16 0453 10/07/16 0458  WBC 19.6* 16.5*  RBC 3.25* 3.14*  HCT 30.0* 28.9*  PLT 229 215    Recent Labs  10/05/16 0434 10/06/16 0453  NA 142 137  K 4.0 3.5  CL 108 103  CO2 27 27  BUN 16 17  CREATININE 0.89 0.71  GLUCOSE 138* 129*  CALCIUM 8.4* 8.3*    Recent Labs  10/06/16 0453 10/07/16 0458  INR 1.16 2.36    Neurologically intact Neurovascular intact Sensation intact distally Dorsiflexion/Plantar flexion intact Incision: dressing C/D/I  Assessment/Plan: 3 Days Post-Op Procedure(s) (LRB): BILATERAL TOTAL KNEE ARTHROPLASTY (Bilateral) Advance diet Up with therapy Discharge to SNF  Heike Pounds C 10/07/2016, 8:05 AM

## 2016-10-07 NOTE — Interval H&P Note (Signed)
Stephen Nixon was admitted today to Inpatient Rehabilitation with the diagnosis of bilateral endstage OA of knees, s/p bilateral TKA's.  The patient's history has been reviewed, patient examined, and there is no change in status.  Patient continues to be appropriate for intensive inpatient rehabilitation.  I have reviewed the patient's chart and labs.  Questions were answered to the patient's satisfaction. The PAPE has been reviewed and assessment remains appropriate.  SWARTZ,ZACHARY T 10/07/2016, 1:42 PM

## 2016-10-07 NOTE — Progress Notes (Addendum)
Report called to [facility] at [facility]. [facility] notified for transport. Shannon Balthazar, [employer]

## 2016-10-07 NOTE — Progress Notes (Signed)
Physical Therapy Treatment Patient Details Name: Stephen Nixon MRN: [id number] DOB: 08/03/1941 Today's Date: 10/07/2016    History of Present Illness Pt s/p Bil TKR    PT Comments    Good progress with mobility and therex this am.  Pt eager for progression to CIR this pm.  Follow Up Recommendations  CIR     Equipment Recommendations  Rolling walker with 5" wheels    Recommendations for Other Services OT consult     Precautions / Restrictions Precautions Precautions: Knee;Fall Required Braces or Orthoses: Knee Immobilizer - Right;Knee Immobilizer - Left Knee Immobilizer - Right: Discontinue once straight leg raise with < 10 degree lag (Pt performed IND SLR on R this am) Knee Immobilizer - Left: Discontinue once straight leg raise with < 10 degree lag Restrictions Weight Bearing Restrictions: No RLE Weight Bearing: Weight bearing as tolerated LLE Weight Bearing: Weight bearing as tolerated    Mobility  Bed Mobility Overal bed mobility: Needs Assistance Bed Mobility: Supine to Sit     Supine to sit: Min assist     General bed mobility comments: cues for sequence and assist for Bil LEs  Transfers Overall transfer level: Needs assistance Equipment used: Rolling walker (2 wheeled) Transfers: Sit to/from Stand Sit to Stand: Min assist;Mod assist;+2 physical assistance;From elevated surface;+2 safety/equipment         General transfer comment: cues for walking LEs fwd and back and for use of UEs to self assist  Ambulation/Gait Ambulation/Gait assistance: Min assist;+2 safety/equipment Ambulation Distance (Feet): 42 Feet Assistive device: Rolling walker (2 wheeled) Gait Pattern/deviations: Step-to pattern;Decreased step length - right;Decreased step length - left;Shuffle;Trunk flexed Gait velocity: decr Gait velocity interpretation: Below normal speed for age/gender General Gait Details: cues for sequence, posture and postion from RW.   Stairs             Wheelchair Mobility    Modified Rankin (Stroke Patients Only)       Balance                                    Cognition Arousal/Alertness: Awake/alert Behavior During Therapy: WFL for tasks assessed/performed Overall Cognitive Status: Within Functional Limits for tasks assessed                      Exercises Total Joint Exercises Ankle Circles/Pumps: AROM;Both;15 reps;Supine Quad Sets: AROM;Both;Supine;15 reps Heel Slides: AAROM;Both;Supine;15 reps Straight Leg Raises: Both;Supine;20 reps;AAROM;AROM    General Comments        Pertinent Vitals/Pain Pain Assessment: 0-10 Pain Score: 4  Pain Location: Bil knees Pain Descriptors / Indicators: Aching;Sore Pain Intervention(s): Limited activity within patient's tolerance;Monitored during session;Premedicated before session;Ice applied    Home Living                      Prior Function            PT Goals (current goals can now be found in the care plan section) Acute Rehab PT Goals Patient Stated Goal: Regain IND and home ASAP PT Goal Formulation: With patient Time For Goal Achievement: 10/10/16 Potential to Achieve Goals: Good Progress towards PT goals: Progressing toward goals    Frequency    7X/week      PT Plan Current plan remains appropriate    Co-evaluation             End of Session Equipment Utilized During  Treatment: Gait belt;Left knee immobilizer Activity Tolerance: Patient tolerated treatment well Patient left: in chair;with call bell/phone within reach     Time: 1115-1155 PT Time Calculation (min) (ACUTE ONLY): 40 min  Charges:  $Gait Training: 8-22 mins $Therapeutic Exercise: 23-37 mins                    G Codes:      Gaylynn Seiple 10-25-2016, 2:51 PM

## 2016-10-07 NOTE — Plan of Care (Signed)
Problem: Activity: Goal: Risk for activity intolerance will decrease Outcome: Adequate for Discharge To CIR

## 2016-10-07 NOTE — Progress Notes (Signed)
Discharged from floor via [employer] operational officer for transport to [employer]. Belongings with pt. No changes in assessment. Glenn Christo, [employer]

## 2016-10-07 NOTE — Progress Notes (Signed)
Low grade temp noted. UA/UCX requested. Monitor incisions as well which are slightly erythematous. Pt in no distress per RN.  Meredith Staggers, MD, [place] 10/07/2016

## 2016-10-07 NOTE — Progress Notes (Signed)
Transported to [place] via [facility] from [place] and oriented.  Ice to both knees.  VS 100.8-111-18 153/77 PO2 95% on room air.  Describes pain at a 2 when legs are still.

## 2016-10-07 NOTE — H&P (View-Only) (Signed)
Physical Medicine and Rehabilitation Admission H&P    CC: Bilateral Knee OA  HPI: Uriel Dowding is a 75 year old male with bilateral knee OA with pain and with failure of conservative therapy. He elected to undergo B-TKR on 10/04/16 by Dr. Wynelle Link. Post op WBAT and epidural removed on 10/27. On coumadin/lovenox bridge for DVT prophylaxis. Therapy ongoing and patient limited by pain and fatigue. Noted to have downward trend in H/H as well as leucocytosis. CIR recommended for follow up therapy.   Review of Systems  HENT: Negative for hearing loss.   Eyes: Negative for blurred vision and double vision.  Respiratory: Negative for cough and shortness of breath.   Cardiovascular: Negative for chest pain and palpitations.  Gastrointestinal: Positive for nausea. Negative for abdominal pain and heartburn.  Genitourinary: Negative for dysuria and urgency.  Musculoskeletal: Positive for joint pain and myalgias (aches around knee and calf).  Skin: Negative for itching and rash.  Neurological: Negative for dizziness, tingling, focal weakness and headaches.  Psychiatric/Behavioral: The patient is nervous/anxious and has insomnia.       Past Medical History:  Diagnosis Date  . Anemia 1990   Hairy cell keukemia  . Arthritis     Past Surgical History:  Procedure Laterality Date  . 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 TONSILLECTOMY    . TOTAL KNEE ARTHROPLASTY Bilateral 10/04/2016   Procedure: BILATERAL TOTAL KNEE ARTHROPLASTY;  Surgeon: Gaynelle Arabian, MD;  Location: [facility];  Service: Orthopedics;  Laterality: Bilateral;    History reviewed. No pertinent family history.    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 He reports that he quit smoking about 49 years ago. His smoking use included Cigarettes. He has never used smokeless tobacco. He reports that he drinks alcohol--couple of beers/week.  He reports that he does not use  drugs.    Allergies  Allergen Reactions  . Tylenol [Acetaminophen] Rash    Medications Prior to Admission  Medication Sig Dispense Refill  . aspirin 325 MG tablet Take 325 mg by mouth 2 (two) times daily.     Marland Kitchen Bioflavonoid Products (ESTER-C TR PO) Take 500 mg by mouth daily.    . Cyanocobalamin (VITAMIN B-12 PO) Take 1 tablet by mouth daily.    Marland Kitchen ibuprofen (ADVIL,MOTRIN) 200 MG tablet Take 200 mg by mouth every 6 (six) hours as needed (for pain.).    Marland Kitchen Melatonin 3 MG CAPS Take 3 mg by mouth at bedtime.     . traMADol (ULTRAM) 50 MG tablet TAKE 1 TABLET BY MOUTH EVERY 6 HOURS AS NEEDED (Patient not taking: Reported on 09/25/2016) 60 tablet 0    Home: Home Living Family/patient expects to be discharged to:: Private residence Living Arrangements: Spouse/significant other Available Help at Discharge: Family Type of Home: House Home Access: Stairs to enter Technical brewer of Steps: 1 Home Layout: Able to live on main level with bedroom/bathroom Bathroom Shower/Tub: Chiropodist: Handicapped height Home Equipment: None Additional Comments: pt installed a grab bar and has a counter next to comfort height commode   Functional History: Prior Function Level of Independence: Independent  Functional Status:  Mobility: Bed Mobility Overal bed mobility: Needs Assistance, +2 for physical assistance, + 2 for safety/equipment Bed Mobility: Sit to Supine Supine to sit: Min assist, +2 for physical assistance, +2 for safety/equipment Sit to supine: Min assist, Mod assist, +2 for physical assistance, +2 for safety/equipment General bed mobility  comments: OOB deferred at pt request 2* fatigue Transfers Overall transfer level: Needs assistance Equipment used: Rolling walker (2 wheeled) Transfers: Sit to/from Stand Sit to Stand: Min assist, Mod assist, +2 physical assistance, From elevated surface General transfer comment: cues for walking LEs fwd and back and for  use of UEs to self assist Ambulation/Gait Ambulation/Gait assistance: Min assist, Mod assist, +2 safety/equipment Ambulation Distance (Feet): 20 Feet Assistive device: Rolling walker (2 wheeled) Gait Pattern/deviations: Step-to pattern, Decreased step length - right, Decreased step length - left, Shuffle, Trunk flexed General Gait Details: cues for sequence, posture and postion from RW.  Pt denies dizziness until returned to seated position. Gait velocity: decr Gait velocity interpretation: Below normal speed for age/gender    ADL: ADL Overall ADL's : Needs assistance/impaired Toilet Transfer: Minimal assistance, +2 for safety/equipment, Ambulation (around bed) General ADL Comments: ambulated to hall and pt got sweaty--needed to sit.  Demonstrated tub transfer bench.  Pt is not ready to use AE at this time.    Cognition: Cognition Overall Cognitive Status: Within Functional Limits for tasks assessed Orientation Level: Oriented X4 Cognition Arousal/Alertness: Awake/alert Behavior During Therapy: WFL for tasks assessed/performed Overall Cognitive Status: Within Functional Limits for tasks assessed   Blood pressure (!) 141/81, pulse (!) 113, temperature (!) 100.8 F (38.2 C), temperature source Oral, resp. rate 18, height _0  (1.778 m), weight 73.8 kg (162 lb 12 oz), SpO2 97 %. Physical Exam  Nursing note and vitals reviewed. Constitutional: He is oriented to person, place, and time. He appears well-developed and well-nourished. No distress.  HENT:  Head: Normocephalic and atraumatic.  Mouth/Throat: Oropharynx is clear and moist.  Eyes: Conjunctivae are normal. Pupils are equal, round, and reactive to light.  Neck: Normal range of motion. Neck supple.  Cardiovascular: Normal rate and regular rhythm.   No murmur heard. Respiratory: Effort normal and breath sounds normal. No stridor. No respiratory distress. He has no wheezes.  GI: Soft. Bowel sounds are normal. He exhibits no  distension. There is no tenderness.  Musculoskeletal: He exhibits edema and tenderness.  Knee ROM 30-40 degrees.   Neurological: He is alert and oriented to person, place, and time. No cranial nerve deficit. Coordination normal.  UE 5/5. LE: cannot perform a straight leg lift. KE/KF 2/5. ADF/PF 4/5. No sensory findings. Cognitively appropriate.   Skin: Skin is warm and dry. He is not diaphoretic. No erythema.  Psychiatric: He has a normal mood and affect. His behavior is normal. Thought content normal.    Results for orders placed or performed during the hospital encounter of 10/04/16 (from the past 48 hour(s))  CBC     Status: Abnormal   Collection Time: 10/05/16  4:34 AM  Result Value Ref Range   WBC 17.6 (H) 4.0 - 10.5 K/uL   RBC 3.78 (L) 4.22 - 5.81 MIL/uL   Hemoglobin 11.9 (L) 13.0 - 17.0 g/dL   HCT 35.5 (L) 39.0 - 52.0 %   MCV 93.9 78.0 - 100.0 fL   MCH 31.5 26.0 - 34.0 pg   MCHC 33.5 30.0 - 36.0 g/dL   RDW 13.9 11.5 - 15.5 %   Platelets 277 150 - 400 K/uL  Basic metabolic panel     Status: Abnormal   Collection Time: 10/05/16  4:34 AM  Result Value Ref Range   Sodium 142 135 - 145 mmol/L   Potassium 4.0 3.5 - 5.1 mmol/L   Chloride 108 101 - 111 mmol/L   CO2 27 22 - 32 mmol/L  Glucose, Bld 138 (H) 65 - 99 mg/dL   BUN 16 6 - 20 mg/dL   Creatinine, Ser 0.89 0.61 - 1.24 mg/dL   Calcium 8.4 (L) 8.9 - 10.3 mg/dL   GFR calc non Af Amer >60 >60 mL/min   GFR calc Af Amer >60 >60 mL/min    Comment: (NOTE) The eGFR has been calculated using the CKD EPI equation. This calculation has not been validated in all clinical situations. eGFR's persistently <60 mL/min signify possible Chronic Kidney Disease.    Anion gap 7 5 - 15  Protime-INR     Status: None   Collection Time: 10/05/16  4:34 AM  Result Value Ref Range   Prothrombin Time 14.6 11.4 - 15.2 seconds   INR 1.14   CBC     Status: Abnormal   Collection Time: 10/06/16  4:53 AM  Result Value Ref Range   WBC 19.6 (H) 4.0  - 10.5 K/uL   RBC 3.25 (L) 4.22 - 5.81 MIL/uL   Hemoglobin 10.2 (L) 13.0 - 17.0 g/dL   HCT 30.0 (L) 39.0 - 52.0 %   MCV 92.3 78.0 - 100.0 fL   MCH 31.4 26.0 - 34.0 pg   MCHC 34.0 30.0 - 36.0 g/dL   RDW 13.7 11.5 - 15.5 %   Platelets 229 150 - 400 K/uL  Basic metabolic panel     Status: Abnormal   Collection Time: 10/06/16  4:53 AM  Result Value Ref Range   Sodium 137 135 - 145 mmol/L   Potassium 3.5 3.5 - 5.1 mmol/L   Chloride 103 101 - 111 mmol/L   CO2 27 22 - 32 mmol/L   Glucose, Bld 129 (H) 65 - 99 mg/dL   BUN 17 6 - 20 mg/dL   Creatinine, Ser 0.71 0.61 - 1.24 mg/dL   Calcium 8.3 (L) 8.9 - 10.3 mg/dL   GFR calc non Af Amer >60 >60 mL/min   GFR calc Af Amer >60 >60 mL/min    Comment: (NOTE) The eGFR has been calculated using the CKD EPI equation. This calculation has not been validated in all clinical situations. eGFR's persistently <60 mL/min signify possible Chronic Kidney Disease.    Anion gap 7 5 - 15  Protime-INR     Status: None   Collection Time: 10/06/16  4:53 AM  Result Value Ref Range   Prothrombin Time 14.9 11.4 - 15.2 seconds   INR 1.16    No results found.     Medical Problem List and Plan: 1.  Functional and mobility deficits secondary to endstage OA of bilateral knees s/p bilateral TKA's.  -admit to inpatient rehab today 2.  DVT Prophylaxis/Anticoagulation: Pharmaceutical: Coumadin and Lovenox 3. Pain Management: Encouraged to use robaxin for muscle spasms. Continue 10-15 oxycodone prn.  4. Mood: LCSW to follow for evaluation  And support.  5. Neuropsych: This patient is capable of making decisions on his own behalf. 6. Skin/Wound Care:  Routine pressure relief measures. Monitor wound for healing.  7. Fluids/Electrolytes/Nutrition: Monitor I/O. still 8. ABLA: On iron bid. Will recheck am.  9. Leucocytosis: Likely reactive. Monitor temps and other signs of infection. 10 Nausea:Will continue liquid diet for now. Will schedule miralax daily.       Post Admission Physician Evaluation: 1. Functional deficits secondary  to endstage OA bilateral knees/ bilateral TKA's. 2. Patient is admitted to receive collaborative, interdisciplinary care between the physiatrist, rehab nursing staff, and therapy team. 3. Patient's level of medical complexity and substantial therapy needs in  context of that medical necessity cannot be provided at a lesser intensity of care such as a SNF. 4. Patient has experienced substantial functional loss from his/her baseline which was documented above under the "Functional History" and "Functional Status" headings.  Judging by the patient's diagnosis, physical exam, and functional history, the patient has potential for functional progress which will result in measurable gains while on inpatient rehab.  These gains will be of substantial and practical use upon discharge  in facilitating mobility and self-care at the household level. 5. Physiatrist will provide 24 hour management of medical needs as well as oversight of the therapy plan/treatment and provide guidance as appropriate regarding the interaction of the two. 6. 24 hour rehab nursing will assist with bladder management, bowel management, safety, skin/wound care, disease management, medication administration, pain management and patient education  and help integrate therapy concepts, techniques,education, etc. 7. PT will assess and treat for/with: Lower extremity strength, range of motion, stamina, balance, functional mobility, safety, adaptive techniques and equipment, pain mgt, knee ROM.   Goals are: mod I. 8. OT will assess and treat for/with: ADL's, functional mobility, safety, upper extremity strength, adaptive techniques and equipment, pain mgt, ego support, community reintegration.   Goals are: mod I. Therapy may proceed with showering this patient. 9. SLP will assess and treat for/with: n/a.  Goals are: n/a. 10. Case Management and Social Worker will assess  and treat for psychological issues and discharge planning. 11. Team conference will be held weekly to assess progress toward goals and to determine barriers to discharge. 12. Patient will receive at least 3 hours of therapy per day at least 5 days per week. 13. ELOS: 7-8 days       14. Prognosis:  excellent     Meredith Staggers, MD, [facility] 10/07/2016

## 2016-10-08 ENCOUNTER — Inpatient Hospital Stay (HOSPITAL_COMMUNITY): Payer: Medicare Other

## 2016-10-08 ENCOUNTER — Inpatient Hospital Stay (HOSPITAL_COMMUNITY): Payer: Medicare Other | Admitting: Physical Therapy

## 2016-10-08 DIAGNOSIS — N390 Urinary tract infection, site not specified: Secondary | ICD-10-CM

## 2016-10-08 DIAGNOSIS — A499 Bacterial infection, unspecified: Secondary | ICD-10-CM

## 2016-10-08 LAB — COMPREHENSIVE METABOLIC PANEL
ALBUMIN: 2.3 g/dL — AB (ref 3.5–5.0)
ALK PHOS: 64 U/L (ref 38–126)
ALT: 15 U/L — ABNORMAL LOW (ref 17–63)
AST: 25 U/L (ref 15–41)
Anion gap: 7 (ref 5–15)
BILIRUBIN TOTAL: 1.1 mg/dL (ref 0.3–1.2)
BUN: 10 mg/dL (ref 6–20)
CALCIUM: 8.1 mg/dL — AB (ref 8.9–10.3)
CO2: 31 mmol/L (ref 22–32)
Chloride: 99 mmol/L — ABNORMAL LOW (ref 101–111)
Creatinine, Ser: 0.75 mg/dL (ref 0.61–1.24)
GFR calc Af Amer: >60 mL/min (ref >60)
GFR calc non Af Amer: >60 mL/min (ref >60)
GLUCOSE: 97 mg/dL (ref 65–99)
Potassium: 3.1 mmol/L — ABNORMAL LOW (ref 3.5–5.1)
SODIUM: 137 mmol/L (ref 135–145)
TOTAL PROTEIN: 5.5 g/dL — AB (ref 6.5–8.1)

## 2016-10-08 LAB — CBC WITH DIFFERENTIAL/PLATELET
BASOS ABS: 0 10*3/uL (ref 0.0–0.1)
Basophils Relative: 0 %
Eosinophils Absolute: 0.1 10*3/uL (ref 0.0–0.7)
Eosinophils Relative: 1 %
HEMATOCRIT: 30 % — AB (ref 39.0–52.0)
HEMOGLOBIN: 10.2 g/dL — AB (ref 13.0–17.0)
LYMPHS PCT: 23 %
Lymphs Abs: 3.2 10*3/uL (ref 0.7–4.0)
MCH: 31.6 pg (ref 26.0–34.0)
MCHC: 34 g/dL (ref 30.0–36.0)
MCV: 92.9 fL (ref 78.0–100.0)
MONOS PCT: 15 %
Monocytes Absolute: 2.1 10*3/uL — ABNORMAL HIGH (ref 0.1–1.0)
NEUTROS ABS: 8.4 10*3/uL — AB (ref 1.7–7.7)
NEUTROS PCT: 61 %
Platelets: 242 10*3/uL (ref 150–400)
RBC: 3.23 MIL/uL — AB (ref 4.22–5.81)
RDW: 13.7 % (ref 11.5–15.5)
WBC: 13.8 10*3/uL — AB (ref 4.0–10.5)

## 2016-10-08 LAB — PROTIME-INR
INR: 2.72
Prothrombin Time: 29.4 seconds — ABNORMAL HIGH (ref 11.4–15.2)

## 2016-10-08 MED ORDER — WARFARIN SODIUM 2 MG PO TABS
1.0000 mg | ORAL_TABLET | Freq: Once | ORAL | Status: AC
Start: 2016-10-08 — End: 2016-10-08
  Administered 2016-10-08: 1 mg via ORAL
  Filled 2016-10-08: qty 0.5

## 2016-10-08 NOTE — Progress Notes (Signed)
Orthopedic Tech Progress Note Patient Details:  Stephen Nixon February 27, 1941 [id number]  Patient ID: Stephen Nixon, male   DOB: February 28, 1941, 75 y.o.   MRN: [id number]   Stephen Nixon 10/08/2016, 1:57 PM Placed pt's lle/rleo n cpms @1355 ; RN notified

## 2016-10-08 NOTE — Progress Notes (Signed)
UA sent. Temp. 101.4. Patient without complaint of . Paged Dr. Naaman Plummer, Keflex started.

## 2016-10-08 NOTE — Evaluation (Signed)
Physical Therapy Assessment and Plan  Patient Details  Name: Stephen Nixon MRN: [id number] Date of Birth: [birth year]  PT Diagnosis: Abnormality of gait, Difficulty walking, Muscle weakness and Pain in [place] Rehab Potential: Excellent ELOS: 7-10 days   Today's Date: 10/08/2016 PT Individual Time: 5400-8676 PT Individual Time Calculation (min): 73 min     Problem List: Patient Active Problem List   Diagnosis Date Noted  . S/P TKR (total knee replacement), bilateral 10/07/2016  . Postoperative anemia due to acute blood loss 10/07/2016  . Leukocytosis 10/07/2016  . Primary osteoarthritis of knees, bilateral 10/07/2016  . OA (osteoarthritis) of knee 10/04/2016  . Primary osteoarthritis of both knees 01/14/2016    Past Medical History:  Past Medical History:  Diagnosis Date  . Arthritis   . Closed fracture of left elbow with routine healing   . 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   . Impingement syndrome of left shoulder   . Measles   . Mumps    Past Surgical History:  Past Surgical History:  Procedure Laterality Date  . 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 TONSILLECTOMY    . TOTAL KNEE ARTHROPLASTY Bilateral 10/04/2016   Procedure: BILATERAL TOTAL KNEE ARTHROPLASTY;  Surgeon: Gaynelle Arabian, MD;  Location: [facility];  Service: Orthopedics;  Laterality: Bilateral;    Assessment & Plan Clinical Impression: Patient is a 75 y.o. year old male with bilateral knee OA with pain and with failure of conservative therapy. He elected to undergo B-TKR on 10/04/16 by Dr. Wynelle Link. Post op WBAT and epidural removed on 10/27. On coumadin/lovenox bridge for DVT prophylaxis. Therapy ongoing and patient limited by pain and fatigue. Noted to have downward trend in H/H as well as leucocytosis. CIR recommended for follow up therapy.  Patient transferred to CIR on 10/07/2016 .   Patient currently requires min with mobility secondary to muscle weakness and pain in  B knees, decreased AROM B knees, and decreased standing balance.  Prior to hospitalization, patient was independent  with mobility and lived with Spouse in a House home.  Home access is 1 (3 inch step + door threshold)Stairs to enter.  Patient will benefit from skilled PT intervention to maximize safe functional mobility, minimize fall risk and decrease caregiver burden for planned discharge home with intermittent assist.  Anticipate patient will home health PT versus outpatient PT at discharge.  PT - End of Session Activity Tolerance: Tolerates 30+ min activity with multiple rests Endurance Deficit: Yes Endurance Deficit Description: 2/2 BLE weakness PT Assessment Rehab Potential (ACUTE/IP ONLY): Excellent PT Patient demonstrates impairments in the following area(s): Balance;Endurance;Motor;Pain;Safety;Sensory PT Transfers Functional Problem(s): Bed Mobility;Bed to Chair;Car;Furniture PT Locomotion Functional Problem(s): Ambulation;Wheelchair Mobility;Stairs PT Plan PT Intensity: Minimum of 1-2 x/day ,45 to 90 minutes PT Frequency: 5 out of 7 days PT Duration Estimated Length of Stay: 7-10 days PT Treatment/Interventions: Community reintegration;Ambulation/gait training;DME/adaptive equipment instruction;Neuromuscular re-education;Stair training;UE/LE Strength taining/ROM;Wheelchair propulsion/positioning;UE/LE Coordination activities;Therapeutic Activities;Skin care/wound management;Pain management;Discharge planning;Balance/vestibular training;Disease management/prevention;Functional mobility training;Patient/family education;Therapeutic Exercise PT Transfers Anticipated Outcome(s): Mod I PT Locomotion Anticipated Outcome(s): Mod I PT Recommendation Follow Up Recommendations: Home health PT;Outpatient PT (versus) Patient destination: Home Equipment Recommended: 3 in 1 bedside comode;Rolling walker with 5" wheels  Skilled Therapeutic Intervention Patient received in bed & agreeable to tx,  noting 3/10 pain with movement but premedicated. Pt's wife Romie Minus present for initial portion of evaluation but exited afterwards. Provided pt with appropriate sized w/c cushion for  increased comfort.  Educated pt on safety plan, ELOS, CIR schedule, and weekly interdisciplinary team conference. Evaluation initiated and therapist donned B knee immobilizers total assist for time management. Pt is currently at min assist level for ambulation & transfers with RW & bilateral KI's. Pt ambulates up to 65 ft over even surfaces with decreased B knee flexion, step length, stride length, weight shift and foot clearance 2/2 immobilizers. Pt performs sit<>Stand transfers, car transfers, and stand pivot (with RW) transfers at min assist level but requires assistance to correct strong posterior lean and maintain position of RW as pt attempts to pull up on it; pt with difficulty transferring sit<>stand from low surfaces 2/2 immobilizers. Pt propels w/c with BUE & Supervision throughout unit, gym<>room, with cuing for management of w/c brakes & total assist to manage w/c leg rests. Pt able to negotiate 12 steps (6" + 3") with B rails & min assist on this date. Pt with great difficulty sufficiently flexing knees when descending 6" steps with immobilizers on and will attempt to lift entire body (feet) off of step and descend to next step using upper body; educated pt on proper technique for increased safety. At end of session pt left sitting in w/c in room with BLE on ELR's & all needs within reach.   PT Evaluation Precautions/Restrictions Precautions Precautions: Fall;Knee Required Braces or Orthoses: Knee Immobilizer - Right;Knee Immobilizer - Left Knee Immobilizer - Right: Discontinue once straight leg raise with < 10 degree lag Knee Immobilizer - Left: Discontinue once straight leg raise with < 10 degree lag Restrictions Weight Bearing Restrictions: No RLE Weight Bearing: Weight bearing as tolerated LLE Weight Bearing:  Weight bearing as tolerated   General Chart Reviewed: Yes Response to Previous Treatment: Patient reporting fatigue but able to participate. Family/Caregiver Present: Yes (wife Romie Minus present for initial portion of evaluation only)   Pain Pain Assessment Pain Assessment: 0-10 Pain Score: 3  Pain Type: Acute pain Pain Location: Knee Pain Orientation: Left;Right Pain Descriptors / Indicators: Aching Pain Onset: With Activity Pain Intervention(s):  (premedicated)  Home Living/Prior Functioning Home Living Available Help at Discharge: Family;Available 24 hours/day Type of Home: House Home Access: Stairs to enter [employer] of Steps: 1 (3 inch step + door threshold) Entrance Stairs-Rails: None Home Layout: Two level;1/2 bath on main level (has recliner & half bath on first floor) Alternate Level Stairs-Number of Steps: 13 Alternate Level Stairs-Rails: Can reach both Bathroom Shower/Tub: Chiropodist: Handicapped height  Lives With: Spouse Prior Function Level of Independence: Independent with transfers;Independent with gait;Independent with homemaking with ambulation  Able to Take Stairs?: Yes Driving: Yes 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 Pt denies any changes in vision since admission to CIR.  Pt reports being hard of hearing but no hearing aide devices utilized.    Cognition Overall Cognitive Status: Within Functional Limits for tasks assessed Arousal/Alertness: Awake/alert Orientation Level: Oriented X4 Memory: Appears intact Awareness: Appears intact Problem Solving: Appears intact Safety/Judgment: Appears intact   Sensation Sensation Light Touch: Appears Intact (BLE) Proprioception:  (BLE)  Motor  Motor Motor: Within Functional Limits Motor -  Skilled Clinical Observations: BLE weakness 2/2 BLE TKA   Mobility Bed Mobility Bed Mobility: Rolling Left;Supine to Sit;Sit to Supine Rolling Left: 5: Supervision;With rail Supine to Sit: 5: Supervision Sit to Supine: 5: Supervision Transfers Transfers: Yes Sit to Stand:  4: Min assist;With armrests Sit to Stand Details: Verbal cues for technique;Verbal cues for precautions/safety Stand to Sit: 4: Min assist Stand to Sit Details (indicate cue type and reason): Verbal cues for technique;Verbal cues for precautions/safety Stand Pivot Transfers: 4: Min assist (with RW) Stand Pivot Transfer Details: Verbal cues for technique;Verbal cues for sequencing  Locomotion  Ambulation Ambulation: Yes Ambulation/Gait Assistance: 4: Min assist Ambulation Distance (Feet): 65 Feet Assistive device: Rolling walker Gait Gait: Yes Gait Pattern: Step-to pattern;Decreased step length - right;Decreased step length - left;Decreased stride length;Decreased weight shift to right;Decreased weight shift to left;Decreased hip/knee flexion - left;Decreased hip/knee flexion - right;Decreased stance time - left;Decreased stance time - right;Poor foot clearance - right;Poor foot clearance - left (BLE Knee immobilizers donned) Stairs / Additional Locomotion Stairs: Yes Stairs Assistance: 4: Min assist Stairs Assistance Details: Verbal cues for precautions/safety;Verbal cues for technique;Verbal cues for sequencing Stair Management Technique: Two rails Number of Stairs: 12 Height of Stairs:  (6 inches + 3 inches) Wheelchair Mobility Wheelchair Mobility: Yes Wheelchair Assistance: 5: Careers information officer: Both upper extremities Wheelchair Parts Management: Supervision/cueing Distance: 100 ft   Trunk/Postural Assessment  Cervical Assessment Cervical Assessment: Within Scientist, physiological Assessment: Within Functional Limits Lumbar Assessment Lumbar Assessment: Within  Functional Limits  Extremity Assessment  RLE Assessment RLE Assessment:  (flexion = 68 degrees (AAROM), 18 degrees from full extension (AROM)) LLE Assessment LLE Assessment:  (70 degrees flexion (AAROM), 15 degrees from full extension (AROM))   See Function Navigator for Current Functional Status.   Refer to Care Plan for Long Term Goals  Recommendations for other services: None  Discharge Criteria: Patient will be discharged from PT if patient refuses treatment 3 consecutive times without medical reason, if treatment goals not met, if there is a change in medical status, if patient makes no progress towards goals or if patient is discharged from hospital.  The above assessment, treatment plan, treatment alternatives and goals were discussed and mutually agreed upon: by patient  Waunita Schooner 10/08/2016, 5:07 PM

## 2016-10-08 NOTE — Plan of Care (Signed)
Problem: RH BOWEL ELIMINATION Goal: RH STG MANAGE BOWEL W/MEDICATION W/ASSISTANCE STG Manage Bowel with Medication with min Assistance.  Outcome: Not Progressing No BM in 5 days, LOC given.

## 2016-10-08 NOTE — Plan of Care (Signed)
Problem: RH Other (Specify) Goal: RH LTG Other (Specify)1 Pt will negotiate 3 inch step + threshold with LRAD & Mod I.

## 2016-10-08 NOTE — Progress Notes (Signed)
Tolerated CPM  At 45 degrees -1750-2100. Stephen Nixon

## 2016-10-08 NOTE — Progress Notes (Signed)
[place] PHYSICAL MEDICINE & REHABILITATION     PROGRESS NOTE    Subjective/Complaints: Had a good night. Feels that he's got his appetite back. Still with low grade temp. RN reports urine has odor.  Objective: Vital Signs: Blood pressure 123/73, pulse (!) 102, temperature 100 F (37.8 C), temperature source Oral, resp. rate 18, height 5\' 10"  (1.778 m), weight 79.7 kg (175 lb 11.2 oz), SpO2 99 %. No results found.  Recent Labs  10/07/16 0458 10/08/16 0507  WBC 16.5* 13.8*  HGB 9.9* 10.2*  HCT 28.9* 30.0*  PLT 215 242    Recent Labs  10/06/16 0453 10/08/16 0507  NA 137 137  K 3.5 3.1*  CL 103 99*  GLUCOSE 129* 97  BUN 17 10  CREATININE 0.71 0.75  CALCIUM 8.3* 8.1*   CBG (last 3)  No results for input(s): GLUCAP in the last 72 hours.  Wt Readings from Last 3 Encounters:  10/07/16 79.7 kg (175 lb 11.2 oz)  10/04/16 73.8 kg (162 lb 12 oz)  09/27/16 73.8 kg (162 lb 12.8 oz)    Physical Exam:  Constitutional: He is oriented to person, place, and time. He appears well-developed and well-nourished. No distress.  HENT:  Head: Normocephalic and atraumatic.  Mouth/Throat: Oropharynx is clear and moist.  Eyes: Conjunctivae are normal. Pupils are equal, round, and reactive to light.  Neck: Normal range of motion. Neck supple.  Cardiovascular: Normal rate and regular rhythm.  pulses intact No murmur heard. Respiratory: Effort normal and breath sounds normal. No stridor. No respiratory distress. He has no wheezes or rales.  GI: Soft. Bowel sounds are normal. He exhibits no distension. There is no tenderness.  Musculoskeletal: He exhibits edema and tenderness.  Knee PROM 450 degrees.   Neurological: He is alert and oriented to person, place, and time. No cranial nerve deficit. Coordination normal.  UE 5/5. LE: still cannot perform a straight leg lift. KE/KF 2/5. ADF/PF 4/5. No sensory findings. Cognitively appropriate.   Skin: Skin is warm and dry. He is not  diaphoretic. Slight erythema surrounding knee incisions  Psychiatric: He has a normal mood and affect. His behavior is normal. Thought content normal.    Assessment/Plan: 1. Functional and mobility deficits secondary to bilateral endstage OA/bilateral TKA's which require 3+ hours per day of interdisciplinary therapy in a comprehensive inpatient rehab setting. Physiatrist is providing close team supervision and 24 hour management of active medical problems listed below. Physiatrist and rehab team continue to assess barriers to discharge/monitor patient progress toward functional and medical goals.  Function:  Bathing Bathing position      Bathing parts      Bathing assist        Upper Body Dressing/Undressing Upper body dressing                    Upper body assist        Lower Body Dressing/Undressing Lower body dressing                                  Lower body assist        Toileting Toileting Toileting activity did not occur: No continent bowel/bladder event        Toileting assist     Transfers Chair/bed Clinical biochemist  Cognition Comprehension Comprehension assist level: Follows complex conversation/direction with no assist  Expression Expression assist level: Expresses complex ideas: With no assist  Social Interaction Social Interaction assist level: Interacts appropriately with others with medication or extra time (anti-anxiety, antidepressant).  Problem Solving Problem solving assist level: Solves complex problems: Recognizes & self-corrects  Memory Memory assist level: Complete Independence: No helper   Medical Problem List and Plan: 1.  Functional and mobility deficits secondary to endstage OA of bilateral knees s/p bilateral TKA's.             -initiating therapies today  -more aggressive CPM 2.  DVT Prophylaxis/Anticoagulation: Pharmaceutical: Coumadin and  Lovenox 3. Pain Management: Encouraged to use robaxin for muscle spasms. Continue 10-15 oxycodone prn.  4. Mood: LCSW to follow for evaluation  And support.  5. Neuropsych: This patient is capable of making decisions on his own behalf. 6. Skin/Wound Care:  Routine pressure relief measures. Monitor wound for healing.  7. Fluids/Electrolytes/Nutrition: Monitor I/O. still 8. ABLA: On iron bid. Will recheck am.  9. Leukocytosis: ?UTI (UA +), sl erythema especially left knee  -empiric keflex started. ucx pending. 10 Nausea: advanced to regular diet---tolerated without issue  -pt had good appetite this morning  -needs BM today---sorbitol, prn supp LOS (Days) 1 A FACE TO FACE EVALUATION WAS PERFORMED  SWARTZ,ZACHARY T 10/08/2016 8:34 AM

## 2016-10-08 NOTE — Plan of Care (Signed)
Problem: RH Stairs Goal: LTG Patient will ambulate up and down stairs w/assist (PT) LTG: Patient will ambulate up and down # of stairs with assistance (PT) Outcome: Not Applicable Date Met: 16/38/45 Entered 2nd goal in error

## 2016-10-08 NOTE — Evaluation (Signed)
Occupational Therapy Assessment and Plan  Patient Details  Name: Stephen Nixon MRN: [id number] Date of Birth: 02/18/1941  OT Diagnosis: muscle weakness (generalized) Rehab Potential: Rehab Potential (ACUTE ONLY): Excellent ELOS: 5-7 days   Today's Date: 10/08/2016 OT Individual Time: 0800-0900 OT Individual Time Calculation (min): 60 min      Problem List:  Patient Active Problem List   Diagnosis Date Noted  . S/P TKR (total knee replacement), bilateral 10/07/2016  . Postoperative anemia due to acute blood loss 10/07/2016  . Leukocytosis 10/07/2016  . Primary osteoarthritis of knees, bilateral 10/07/2016  . OA (osteoarthritis) of knee 10/04/2016  . Primary osteoarthritis of both knees 01/14/2016    Past Medical History:  Past Medical History:  Diagnosis Date  . Arthritis   . Closed fracture of left elbow with routine healing   . 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   . Impingement syndrome of left shoulder   . Measles   . Mumps    Past Surgical History:  Past Surgical History:  Procedure Laterality Date  . 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 TONSILLECTOMY    . TOTAL KNEE ARTHROPLASTY Bilateral 10/04/2016   Procedure: BILATERAL TOTAL KNEE ARTHROPLASTY;  Surgeon: Frank Aluisio, MD;  Location: [facility];  Service: Orthopedics;  Laterality: Bilateral;    Assessment & Plan Clinical Impression: Patient is a 75 y.o. male with bilateral knee OA with pain and with failure of conservative therapy. He elected to undergo B-TKR on 10/04/16 by Dr. Aluisio. Post op WBAT and epidural removed on 10/27. On coumadin/lovenox bridge for DVT prophylaxis. Therapy ongoing and patient limited by pain and fatigue. Noted to have downward trend in H/H as well as leucocytosis.     Patient transferred to CIR on 10/07/2016.    Patient currently requires moderate assistance with basic self-care skills secondary to muscle joint tightness.  Prior to hospitalization,  patient could complete BADL and iADL independently.   Patient will benefit from skilled intervention to increase independence with basic self-care skills prior to discharge home with care partner.  Anticipate patient will require intermittent supervision and no further OT follow recommended.  OT - End of Session Activity Tolerance: Tolerates 30+ min activity without fatigue Endurance Deficit: No OT Assessment Rehab Potential (ACUTE ONLY): Excellent OT Patient demonstrates impairments in the following area(s): Pain;Safety;Balance;Endurance OT Basic ADL's Functional Problem(s): Bathing;Dressing;Toileting OT Transfers Functional Problem(s): Toilet;Tub/Shower OT Additional Impairment(s): None OT Plan OT Intensity: Minimum of 1-2 x/day, 45 to 90 minutes OT Frequency: 5 out of 7 days OT Duration/Estimated Length of Stay: 5-7 dats OT Treatment/Interventions: Therapeutic Exercise;Therapeutic Activities;Self Care/advanced ADL retraining;Pain management;Discharge planning;Functional mobility training;Patient/family education;DME/adaptive equipment instruction;Balance/vestibular training OT Self Feeding Anticipated Outcome(s): Independent OT Basic Self-Care Anticipated Outcome(s): Mod I OT Toileting Anticipated Outcome(s): Mod I OT Bathroom Transfers Anticipated Outcome(s): Mod I OT Recommendation Patient destination: Home Follow Up Recommendations: None Equipment Recommended: To be determined  Skilled Therapeutic Intervention OT initial evaluation completed with treatment provided to address functional transfers, effective use of DME, adapted bathing/dressing skills and functional mobility using RW.   Pt reports plan to use his half-bath only at home until he is able to ascend stairs therefore BADL performed during assessment in w/c at sink.   Pt required overall mod assist with transfers after education on technique and use of AD and DME.    OT Evaluation Precautions/Restrictions   Precautions Precautions: Fall;Knee Required Braces or Orthoses: Knee Immobilizer - Right;Knee Immobilizer - Left   Knee Immobilizer - Right: Discontinue once straight leg raise with < 10 degree lag Knee Immobilizer - Left: Discontinue once straight leg raise with < 10 degree lag Restrictions Weight Bearing Restrictions: No RLE Weight Bearing: Weight bearing as tolerated LLE Weight Bearing: Weight bearing as tolerated   General Chart Reviewed: Yes Family/Caregiver Present: No   Vital Signs Therapy Vitals Temp: 100 F (37.8 C) Temp Source: Oral Pulse Rate: (!) 102 Resp: 18 BP: 123/73 Patient Position (if appropriate): Lying Oxygen Therapy SpO2: 99 % O2 Device: Not Delivered   Pain Pain Assessment Pain Assessment: No/denies pain   Home Living/Prior Functioning Home Living Family/patient expects to be discharged to:: Private residence Living Arrangements: Spouse/significant other Available Help at Discharge: Family Type of Home: House Home Access: Stairs to enter Entrance Stairs-Number of Steps: 1 Entrance Stairs-Rails: None Home Layout: Able to live on main level with bedroom/bathroom Bathroom Shower/Tub: Tub/shower unit Bathroom Toilet: Handicapped height Bathroom Accessibility: Yes Additional Comments: pt installed a grab bar and has a counter next to comfort height commode  Lives With: Spouse (and 3 dogs, larger breed) IADL History Homemaking Responsibilities: Yes Meal Prep Responsibility: No Laundry Responsibility: No Cleaning Responsibility: No Bill Paying/Finance Responsibility: No Shopping Responsibility: No Child Care Responsibility: No Current License: Yes Mode of Transportation: Car Education: BA 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[employer], transporter Leisure and Hobbies: bike riding Prior Function Level of Independence: Independent with basic ADLs, Independent with gait  Able to Take Stairs?: Yes Driving: Yes   ADL ADL ADL  Comments: see Functional Assessment Tool   Vision/Perception  Vision- History Baseline Vision/History: Wears glasses Patient Visual Report: No change from baseline Vision- Assessment Vision Assessment?: No apparent visual deficits Perception Comments: WNL   Cognition Overall Cognitive Status: Within Functional Limits for tasks assessed Arousal/Alertness: Awake/alert Orientation Level: Person;Place;Situation Person: Oriented Place: Oriented Situation: Oriented Year: 2017 Month: October Day of Week: Correct Memory: Appears intact Immediate Memory Recall: Sock;Blue;Bed Memory Recall: Sock;Blue;Bed Memory Recall Sock: Without Cue Memory Recall Blue: Without Cue Memory Recall Bed: Without Cue Attention: Divided Divided Attention: Appears intact Awareness: Appears intact Problem Solving: Appears intact Safety/Judgment: Appears intact   Sensation Sensation Light Touch: Appears Intact Stereognosis: Appears Intact Hot/Cold: Appears Intact Proprioception: Appears Intact Coordination Gross Motor Movements are Fluid and Coordinated: Yes Fine Motor Movements are Fluid and Coordinated: Yes   Motor  Motor Motor: Within Functional Limits   Mobility  Bed Mobility Bed Mobility: Rolling Left;Supine to Sit;Sit to Supine Rolling Left: 5: Supervision;With rail Supine to Sit: 5: Supervision Sit to Supine: 5: Supervision Transfers Sit to Stand: 4: Min assist;With armrests Sit to Stand Details: Verbal cues for technique;Verbal cues for precautions/safety Stand to Sit: 4: Min assist Stand to Sit Details (indicate cue type and reason): Verbal cues for technique;Verbal cues for precautions/safety   Trunk/Postural Assessment  Cervical Assessment Cervical Assessment: Within Functional Limits Thoracic Assessment Thoracic Assessment: Within Functional Limits Lumbar Assessment Lumbar Assessment: Within Functional Limits   Extremity/Trunk Assessment RUE Assessment RUE Assessment:  Within Functional Limits LUE Assessment LUE Assessment: Within Functional Limits   See Function Navigator for Current Functional Status.   Refer to Care Plan for Long Term Goals  Recommendations for other services: None  Discharge Criteria: Patient will be discharged from OT if patient refuses treatment 3 consecutive times without medical reason, if treatment goals not met, if there is a change in medical status, if patient makes no progress towards goals or if patient is discharged from hospital.  The above   assessment, treatment plan, treatment alternatives and goals were discussed and mutually agreed upon: by patient  BARTHOLD,FRANK 10/08/2016, 4:11 PM  

## 2016-10-08 NOTE — Progress Notes (Addendum)
ANTICOAGULATION CONSULT NOTE - Follow Up  Pharmacy Consult for Warfarin Indication: VTE prophylaxis  Patient Measurements: Height: 5\' 10"  (177.8 cm) Weight: 175 lb 11.2 oz (79.7 kg) (pt stated 160lbs) IBW/kg (Calculated) : 73  Vital Signs: Temp: 100 F (37.8 C) (10/29 0525) Temp Source: Oral (10/29 0525) BP: 123/73 (10/29 0525) Pulse Rate: 102 (10/29 0525)  Labs:  Recent Labs  10/06/16 0453 10/07/16 0458 10/08/16 0507  HGB 10.2* 9.9* 10.2*  HCT 30.0* 28.9* 30.0*  PLT 229 215 242  LABPROT 14.9 26.2* 29.4*  INR 1.16 2.36 2.72  CREATININE 0.71  --  0.75    Estimated Creatinine Clearance: 84.9 mL/min (by C-G formula based on SCr of 0.75 mg/dL).   Medical History: Past Medical History:  Diagnosis Date  . Arthritis   . Closed fracture of left elbow with routine healing   . Hairy cell leukemia ([place]) 1990   due to agent orange.   . Impingement syndrome of left shoulder   . Measles   . Mumps    Assessment: 53 YOM with bilateral end stage knee osteoarthritis who underwent a bilateral TKA on 10/25. Pharmacy has been consulted for warfarin dosing for VTE prophylaxis. Hgb 10.2 stable, Plt wnl, no bleeding documented  INR therapeutic, increasing to 2.72 today.   Goal of Therapy:  INR 2-3 Monitor platelets by anticoagulation protocol: Yes  Plan:  - Give Warfarin 1 mg PO x 1 at 1800 - Daily PT/INR - Monitor CBC, s/sx of bleeding   Gwenlyn Perking, PharmD PGY1 Pharmacy Resident Pager: [phone] 10/08/2016 10:58 AM

## 2016-10-08 NOTE — Plan of Care (Signed)
Problem: RH Balance Goal: LTG Patient will maintain dynamic standing balance (PT) LTG:  Patient will maintain dynamic standing balance with assistance during mobility activities (PT) With LRAD  Problem: RH Bed to Chair Transfers Goal: LTG Patient will perform bed/chair transfers w/assist (PT) LTG: Patient will perform bed/chair transfers with assistance, with/without cues (PT). With LRAD  Problem: RH Car Transfers Goal: LTG Patient will perform car transfers with assist (PT) LTG: Patient will perform car transfers with assistance (PT). With LRAD  Problem: RH Furniture Transfers Goal: LTG Patient will perform furniture transfers w/assist (OT/PT LTG: Patient will perform furniture transfers  with assistance (OT/PT). With LRAD  Problem: RH Ambulation Goal: LTG Patient will ambulate in controlled environment (PT) LTG: Patient will ambulate in a controlled environment, # of feet with assistance (PT). 150 ft with LRAD Goal: LTG Patient will ambulate in home environment (PT) LTG: Patient will ambulate in home environment, # of feet with assistance (PT). 43 ft with LRAD Goal: LTG Patient will ambulate in community environment (PT) LTG: Patient will ambulate in community environment, # of feet with assistance (PT). 150 ft with LRAD  Problem: RH Stairs Goal: LTG Patient will ambulate up and down stairs w/assist (PT) LTG: Patient will ambulate up and down # of stairs with assistance (PT) 3 inch step + threshold with LRAD  Problem: RH Stairs Goal: LTG Patient will ambulate up and down stairs w/assist (PT) LTG: Patient will ambulate up and down # of stairs with assistance (PT) 13 steps (6") with B rails

## 2016-10-08 NOTE — Progress Notes (Signed)
Orthopedic Tech Progress Note Patient Details:  Stephen Nixon [birth year] [id number] Took patient out of CPM's for PT. Patient ID: Stephen Nixon, male   DOB: 1941-03-22, 75 y.o.   MRN: [id number]   [name] 10/08/2016, 3:28 PM

## 2016-10-09 ENCOUNTER — Inpatient Hospital Stay (HOSPITAL_COMMUNITY): Payer: Medicare Other | Admitting: Physical Therapy

## 2016-10-09 ENCOUNTER — Inpatient Hospital Stay (HOSPITAL_COMMUNITY): Payer: Medicare Other | Admitting: Occupational Therapy

## 2016-10-09 ENCOUNTER — Inpatient Hospital Stay (HOSPITAL_COMMUNITY): Payer: Medicare Other

## 2016-10-09 DIAGNOSIS — K5903 Drug induced constipation: Secondary | ICD-10-CM

## 2016-10-09 DIAGNOSIS — M1710 Unilateral primary osteoarthritis, unspecified knee: Secondary | ICD-10-CM

## 2016-10-09 DIAGNOSIS — Z96659 Presence of unspecified artificial knee joint: Secondary | ICD-10-CM

## 2016-10-09 DIAGNOSIS — G8918 Other acute postprocedural pain: Secondary | ICD-10-CM | POA: Insufficient documentation

## 2016-10-09 DIAGNOSIS — R6510 Systemic inflammatory response syndrome (SIRS) of non-infectious origin without acute organ dysfunction: Secondary | ICD-10-CM | POA: Insufficient documentation

## 2016-10-09 DIAGNOSIS — E876 Hypokalemia: Secondary | ICD-10-CM

## 2016-10-09 DIAGNOSIS — R609 Edema, unspecified: Secondary | ICD-10-CM

## 2016-10-09 DIAGNOSIS — I10 Essential (primary) hypertension: Secondary | ICD-10-CM

## 2016-10-09 DIAGNOSIS — D62 Acute posthemorrhagic anemia: Secondary | ICD-10-CM | POA: Insufficient documentation

## 2016-10-09 DIAGNOSIS — K5909 Other constipation: Secondary | ICD-10-CM

## 2016-10-09 LAB — BASIC METABOLIC PANEL
ANION GAP: 8 (ref 5–15)
BUN: 14 mg/dL (ref 6–20)
CALCIUM: 7.9 mg/dL — AB (ref 8.9–10.3)
CHLORIDE: 97 mmol/L — AB (ref 101–111)
CO2: 30 mmol/L (ref 22–32)
Creatinine, Ser: 0.76 mg/dL (ref 0.61–1.24)
GFR calc Af Amer: >60 mL/min (ref >60)
GFR calc non Af Amer: >60 mL/min (ref >60)
GLUCOSE: 102 mg/dL — AB (ref 65–99)
Potassium: 2.9 mmol/L — ABNORMAL LOW (ref 3.5–5.1)
Sodium: 135 mmol/L (ref 135–145)

## 2016-10-09 LAB — PROTIME-INR
INR: 2.27
PROTHROMBIN TIME: 25.5 s — AB (ref 11.4–15.2)

## 2016-10-09 LAB — URINE CULTURE: Culture: <10000 — AB

## 2016-10-09 MED ORDER — WARFARIN SODIUM 5 MG PO TABS
2.5000 mg | ORAL_TABLET | Freq: Every day | ORAL | Status: DC
  Administered 2016-10-09 – 2016-10-12 (×4): 2.5 mg via ORAL
  Filled 2016-10-09 (×4): qty 1

## 2016-10-09 MED ORDER — POTASSIUM CHLORIDE CRYS ER 20 MEQ PO TBCR
30.0000 meq | EXTENDED_RELEASE_TABLET | Freq: Three times a day (TID) | ORAL | Status: AC
  Administered 2016-10-09 – 2016-10-10 (×6): 30 meq via ORAL
  Filled 2016-10-09 (×6): qty 1

## 2016-10-09 MED ORDER — SENNOSIDES-DOCUSATE SODIUM 8.6-50 MG PO TABS
1.0000 | ORAL_TABLET | Freq: Two times a day (BID) | ORAL | Status: DC
  Administered 2016-10-09 – 2016-10-17 (×13): 1 via ORAL
  Filled 2016-10-09 (×16): qty 1

## 2016-10-09 MED ORDER — OXYCODONE HCL 5 MG PO TABS
5.0000 mg | ORAL_TABLET | Freq: Four times a day (QID) | ORAL | Status: DC | PRN
  Administered 2016-10-09 – 2016-10-12 (×10): 10 mg via ORAL
  Filled 2016-10-09 (×11): qty 2

## 2016-10-09 MED ORDER — FLEET ENEMA 7-19 GM/118ML RE ENEM
1.0000 | ENEMA | Freq: Once | RECTAL | Status: AC
Start: 2016-10-09 — End: 2016-10-10
  Administered 2016-10-10: 1 via RECTAL
  Filled 2016-10-09: qty 1

## 2016-10-09 MED ORDER — CAMPHOR-MENTHOL 0.5-0.5 % EX LOTN
TOPICAL_LOTION | Freq: Three times a day (TID) | CUTANEOUS | Status: DC
  Administered 2016-10-10 – 2016-10-13 (×10): via TOPICAL
  Administered 2016-10-13: 1 via TOPICAL
  Administered 2016-10-13: 06:00:00 via TOPICAL
  Administered 2016-10-14: 1 via TOPICAL
  Administered 2016-10-14 – 2016-10-17 (×6): via TOPICAL
  Filled 2016-10-09 (×2): qty 222

## 2016-10-09 MED ORDER — POLYETHYLENE GLYCOL 3350 17 G PO PACK
17.0000 g | PACK | Freq: Two times a day (BID) | ORAL | Status: DC
  Administered 2016-10-09 – 2016-10-11 (×3): 17 g via ORAL
  Filled 2016-10-09 (×4): qty 1

## 2016-10-09 NOTE — Progress Notes (Signed)
Physical Therapy Session Note  Patient Details  Name: Stephen Nixon MRN: [id number] Date of Birth: 09/14/1941  Today's Date: 10/09/2016 PT Individual Time: [phone] and [id number] PT Individual Time Calculation (min): 62 min and 70 min    Short Term Goals: Week 1:  PT Short Term Goal 1 (Week 1): STG = LTG Due to short ELOS  Skilled Therapeutic Interventions/Progress Updates:  Pt received in bed; pt c/o tolerable pain; premedicated.  Assisted pt with donning TEDs and KI and then pt performed supine > sit with bed rail and min A.  Performed stand pivot from elevated bed > w/c with RW and min A.  Discussed home set up and equipment needs; pt reports he has a few friends that will be donating RW to him.  Discussed with pt if he can get multiple then he can keep one downstairs, one upstairs and one in the car.  Pt states he may sleep in the recliner when he first gets home until he is able to negotiate full flight of stairs; discussed with pt getting height measurement of recliner at home and bed; pt reports bed is very high.  Performed w/c mobility x 150' supervision.  Performed stair negotiation training with focus on one step negotiation with RW forwards and backwards to minimize knee flexion x 2 reps with min A and 4 stair negotiation x 2 reps with bilat rails ascending forwards, descending backwards with min A and verbal cues for sequence.  Performed gait back to room x 150' with min A with verbal cues for full weight shift to R and to place R foot flat on floor (pt keeping it in PF) and for shoulder depression.  Returned to room and pt transferred to bed with mod A due to posterior LOB due to socks catching on floor and not allowing LE to slide forwards as he sat.  No trauma to knees noted.  KI removed and pt performed sit > supine with min A.  Ice packs placed on bilat knees.    PM session: pt reporting soreness from am sessions but premedicated.  Pt agreeable to HEP training.  Pt instructed in and  return demonstrated bilat LE HEP including ankle pumps, glute/quad sets, heel slides, hip ADD pillow squeezes, hip ABD, SLR, seated heel slides and LAQ.  Provided pt with verbal cue to minimize compensations.  Pt able to perform full SLR on R, will likely be able to D/C KI on RLE but may need to keep L KI for a couple more days.  Provided pt with handouts of each exercise.  Pt returned to supine with wife's mod A, ice packs placed on bilat LE and pt left in bed with all items within reach.  Therapy Documentation Precautions:  Precautions Precautions: Fall, Knee Required Braces or Orthoses: Knee Immobilizer - Right, Knee Immobilizer - Left Knee Immobilizer - Right: Discontinue once straight leg raise with < 10 degree lag Knee Immobilizer - Left: Discontinue once straight leg raise with < 10 degree lag Restrictions Weight Bearing Restrictions: No RLE Weight Bearing: Weight bearing as tolerated LLE Weight Bearing: Weight bearing as tolerated   See Function Navigator for Current Functional Status.   Therapy/Group: Individual Therapy  Raylene Everts Faucette 10/09/2016, 12:30 PM

## 2016-10-09 NOTE — Progress Notes (Signed)
Occupational Therapy Session Note  Patient Details  Name: Stephen Nixon MRN: [id number] Date of Birth: Jan 18, 1941  Today's Date: 10/09/2016 OT Individual Time: [id number] OT Individual Time Calculation (min): 60 min     Short Term Goals: Week 1:  OT Short Term Goal 1 (Week 1): STG = LTG d/t short anticipated LOS.  Skilled Therapeutic Interventions/Progress Updates:   Pt participated in skilled OT session focusing on adaptive bathing/dressing and functional transfers. Pt was lying in bed with ice packs over LEs at start of tx and was agreeable to shower. Knee immobilizers donned at EOB and pt ambulated with RW to shower chair with steady assist and extra time. Knee bandages covered. Pt completed bathing with overall MIn A with instruction for lateral leaning. Pt completed dressing while seated on chair with use of reacher for threading LEs through pants and lifted pants over hips by laterally leaning. Dependent for TEDs, knee immobilizers, and gripper socks. Pt ambulated back into room and was seated at EOB for UB dressing. Lotion applied to back prior 2/2 to rash with nursing consent. Afterwards pt was returned to bed with ice packs on bilateral knees and all needs within reach.   Therapy Documentation Precautions:  Precautions Precautions: Fall, Knee Required Braces or Orthoses: Knee Immobilizer - Right, Knee Immobilizer - Left Knee Immobilizer - Right: Discontinue once straight leg raise with < 10 degree lag Knee Immobilizer - Left: Discontinue once straight leg raise with < 10 degree lag Restrictions Weight Bearing Restrictions: No RLE Weight Bearing: Weight bearing as tolerated LLE Weight Bearing: Weight bearing as tolerated  Pain: Pt reported pain to be manageable with provided rest breaks    ADL: ADL ADL Comments: see Functional Assessment Tool    See Function Navigator for Current Functional Status.  Therapy/Group: Individual Therapy  Sherell Christoffel A Padraic Marinos 10/09/2016, 12:44  PM

## 2016-10-09 NOTE — Progress Notes (Signed)
Meredith Staggers, MD Physician Signed Physical Medicine and Rehabilitation  PMR Pre-admission Date of Service: 10/06/2016 3:13 PM  Related encounter: Admission (Discharged) from 10/04/2016 in [place]       [] Hide copied text   [employer] PMR Admission Coordinator Pre-Admission Assessment  Patient: Stephen Nixon is an 75 y.o., male MRN: [id number] DOB: Jul 06, 1941 Height: 5\' 10"  (177.8 cm) Weight: 73.8 kg (162 lb 12 oz)  Insurance Information HMO: No   PPO:       PCP:       IPA:       80/20:       OTHER:   PRIMARY: Medicare A/B      Policy#: [id number]      Subscriber:  Meryl Crutch CM Name:        Phone#:       Fax#:   Pre-Cert#:        Employer: Retired Benefits:  Phone #:       Name: Checked in [place]. Date: 10/12/2007     Deduct:  $1316      Out of Pocket Max: None      Life Max: unlimited CIR: 100%      SNF: 100 days Outpatient: 80%     Co-Pay: 20% Home Health: 100%      Co-Pay: none DME: 80%     Co-Pay: 20% Providers: patient's choice  SECONDARY:  Tricare for Life      Policy#: [id number]      Subscriber:  Denita Lung CM Name:        Phone#:       Fax#:   Pre-Cert#:        Employer: Retired Benefits:  Phone #: [phone]     Name:   Eff. Date:       Deduct:        Out of Pocket Max:        Life Max:   CIR:        SNF:   Outpatient:       Co-Pay:   Home Health:        Co-Pay:   DME:       Co-Pay:    Emergency Contact Information        Contact Information    Name Relation Home Work Mobile   Linford,Jean Spouse   [phone]     Current Medical History  Patient Admitting Diagnosis: B TKR  History of Present Illness:A 75 year old male with bilateral knee OA with pain and with failure of conservative therapy. He elected to undergo B-TKR on 10/04/16 by Dr. Wynelle Link. Post op WBAT and epidural removed on 10/27. On coumadin/lovenox bridge for DVT prophylaxis. Therapy ongoing and patient limited by pain  and fatigue. Noted to have downward trend in H/H as well as leucocytosis. CIR recommended for follow up therapy.   Patient's medical record from [facility] has been reviewed by the rehabilitation admission coordinator and physician.  Past Medical History      Past Medical History:  Diagnosis Date  . Arthritis   . Closed fracture of left elbow with routine healing   . 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   . Impingement syndrome of left shoulder   . Measles   . Mumps     Family History   family history is not on file.  Prior Rehab/Hospitalizations Has the patient had major surgery during 100 days prior to  admission? No              Current Medications  See MAR from [facility]  Patients Current Diet:  Regular diet, thin liquids  Precautions / Restrictions Precautions Precautions: Knee, Fall Precaution Comments: orthostatic on eval Restrictions Weight Bearing Restrictions: No RLE Weight Bearing: Weight bearing as tolerated LLE Weight Bearing: Weight bearing as tolerated   Has the patient had 2 or more falls or a fall with injury in the past year?Yes.  Patient has fallen 1 time in the past year with left arm fracture 04/22/16 and facial injury.  Prior Activity Level Community (5-7x/wk): Went out daily, was driving.  Prior Functional Level Self Care: Did the patient need help bathing, dressing, using the toilet or eating?  Independent  Indoor Mobility: Did the patient need assistance with walking from room to room (with or without device)? Independent  Stairs: Did the patient need assistance with internal or external stairs (with or without device)? Independent  Functional Cognition: Did the patient need help planning regular tasks such as shopping or remembering to take medications? Independent  Home Assistive Devices / Equipment Home Assistive Devices/Equipment: Eyeglasses Home Equipment:  None  Prior Device Use: Indicate devices/aids used by the patient prior to current illness, exacerbation or injury? None   Prior Functional Level Current Functional Level  Bed Mobility Independent Min assist  Transfers Independent Mod assist  Mobility - Walk/Wheelchair Independent Min assist (ambulated 20 feet RW)  Upper Body Dressing Independent Min assist  Lower Body Dressing Independent Max assist  Grooming Independent Min assist  Eating/Drinking Independent Other (Set up)  Toilet Transfer Independent Mod assist  Bladder Continence  Urinary catheter in place, to be removed evening 10/06/16 Urinary catheter to be removed pm 10/06/16  Bowel Management WDL Last BM 10/03/16  Stair Climbing   Other (Not tried.)  Futures trader, appropriate  Memory Intact Intact  Cooking/Meal Prep Independent     Housework Independent   Money Management Independent   Driving       Special needs/care consideration BiPAP/CPAP No CPM Yes, bilateral CPM Continuous Drip IV No  Dialysis No      Life Vest No Oxygen no Special Bed No Trach Size No Wound Vac (area) No     Skin Has bilateral TKR incisions                            Bowel mgmt: Last BM 10/03/16, currently is nauseated Bladder mgmt: Urinary catheter to be removed this pm, 10/06/16 Diabetic mgmt No  Previous Home Environment Living Arrangements: Spouse/significant other Available Help at Discharge: Family Type of Home: House Home Layout: Able to live on main level with bedroom/bathroom Home Access: Stairs to enter Technical brewer of Steps: 1 Bathroom Shower/Tub: Chiropodist: Handicapped height [place]: No Additional Comments: pt installed a grab bar and has a counter next to comfort height commode  Discharge [place] for Discharge Living Setting: Patient's home, House, Lives with (comment) (Lives with wife.) Type of Home at Discharge: House Discharge Home  Layout: Two level, 1/2 bath on main level, Bed/bath upstairs (Plans to stay on first floor in an easy chair.) Alternate Level Stairs-Number of Steps: Flight Discharge Home Access: Stairs to enter [employer] of Steps: 1 step entry Does the patient have any problems obtaining your medications?: No  Social/Family/Support Systems Patient Roles: Spouse (Has a wife.) Contact Information: Celeste Ruesga - wife  Anticipated Caregiver: wife Anticipated Caregiver's Contact Information: Romie Minus - wife - [phone] Ability/Limitations of Caregiver: Wife is retired and can assist. Careers adviser: 24/7 Discharge Plan Discussed with Primary Caregiver: Yes Is Caregiver In Agreement with Plan?: Yes Does Caregiver/Family have Issues with Lodging/Transportation while Pt is in Rehab?: No  Goals/Additional Needs Patient/Family Goal for Rehab: PT/OT mod I goals Expected length of stay: 5-7 days Cultural Considerations: None Dietary Needs: Regular diet, thin liquids Equipment Needs: TBD Pt/Family Agrees to Admission and willing to participate: Yes Program Orientation Provided & Reviewed with Pt/Caregiver Including Roles  & Responsibilities: Yes  Patient Condition: I have talked with patient and his wife while patient at [facility]  I have reviewed all notes and progress and have shared with rehab MD.  Patient has had a B TKR and he will benefit from an inpatient rehab stay.  He needs the coordinated approach to therapies to provide the best outcome for patient.  He can tolerate 3 hours of therapy a day.  I have approval from rehab MD for inpatient rehab admission for tomorrow, Saturday.  Preadmission Screen Completed By:  Retta Diones, 10/06/2016 3:31 PM ______________________________________________________________________   Discussed status with Dr. Naaman Plummer on 10/06/16 at 1530 and received telephone approval for admission tomorrow.  Admission Coordinator:   Retta Diones, time 1530/Date 10/06/16   Assessment/Plan: Diagnosis: OA bilateral knees 1. Does the need for close, 24 hr/day  Medical supervision in concert with the patient's rehab needs make it unreasonable for this patient to be served in a less intensive setting? Yes 2. Co-Morbidities requiring supervision/potential complications: left shoulder impingement syndrome, ABLA 3. Due to bladder management, bowel management, safety, skin/wound care, disease management, medication administration, pain management and patient education, does the patient require 24 hr/day rehab nursing? Yes 4. Does the patient require coordinated care of a physician, rehab nurse, PT (1-2 hrs/day, 5 days/week) and OT (1-2 hrs/day, 5 days/week) to address physical and functional deficits in the context of the above medical diagnosis(es)? Yes Addressing deficits in the following areas: balance, endurance, locomotion, strength, transferring, bowel/bladder control, bathing, dressing, feeding, grooming, toileting and psychosocial support 5. Can the patient actively participate in an intensive therapy program of at least 3 hrs of therapy 5 days a week? Yes 6. The potential for patient to make measurable gains while on inpatient rehab is excellent 7. Anticipated functional outcomes upon discharge from inpatients are: modified independent PT, modified independent OT, n/a SLP 8. Estimated rehab length of stay to reach the above functional goals is: 5-7 days 9. Does the patient have adequate social supports to accommodate these discharge functional goals? Yes 10. Anticipated D/C setting: Home 11. Anticipated post D/C treatments: [place] therapy 12. Overall Rehab/Functional Prognosis: excellent    RECOMMENDATIONS: This patient's condition is appropriate for continued rehabilitative care in the following setting: CIR Patient has agreed to participate in recommended program. Yes Note that insurance prior authorization may be  required for reimbursement for recommended care.  Comment: Appropriate for inpatient rehab.   Meredith Staggers, MD, [place] Physical Medicine & Rehabilitation 10/06/2016   Retta Diones 10/06/2016    Revision History

## 2016-10-09 NOTE — Progress Notes (Signed)
Orthopedic Tech Progress Note Patient Details:  Stephen Nixon Feb 20, 1941 [id number]  CPM Left Knee CPM Left Knee: Off Left Knee Flexion (Degrees): 55 Left Knee Extension (Degrees): 0 Additional Comments:  (increased to 55 degrees at 2000, tolerated increase) CPM Right Knee CPM Right Knee: On Right Knee Flexion (Degrees): 55 Right Knee Extension (Degrees): 0 Additional Comments:  (increased to 55)   [name] 10/09/2016, 6:19 PM

## 2016-10-09 NOTE — Progress Notes (Signed)
ANTICOAGULATION CONSULT NOTE - Follow Up  Pharmacy Consult for Warfarin Indication: VTE prophylaxis  Patient Measurements: Height: 5\' 10"  (177.8 cm) Weight: 175 lb 11.2 oz (79.7 kg) (pt stated 160lbs) IBW/kg (Calculated) : 73  Vital Signs: Temp: 99.7 F (37.6 C) (10/30 0533) Temp Source: Oral (10/30 0533) BP: 130/72 (10/30 0533) Pulse Rate: 109 (10/30 0533)  Labs:  Recent Labs  10/07/16 0458 10/08/16 0507 10/09/16 0703  HGB 9.9* 10.2*  --   HCT 28.9* 30.0*  --   PLT 215 242  --   LABPROT 26.2* 29.4* 25.5*  INR 2.36 2.72 2.27  CREATININE  --  0.75 0.76    Estimated Creatinine Clearance: 84.9 mL/min (by C-G formula based on SCr of 0.76 mg/dL).  Assessment: 36 YOM with bilateral end stage knee osteoarthritis who underwent a bilateral TKA on 10/25. Pharmacy has been consulted for warfarin dosing for VTE prophylaxis. Hgb 10.2 stable, Plt wnl, no bleeding documented  INR therapeutic, increasing to 2.27 today.   Goal of Therapy:  INR 2-3 Monitor platelets by anticoagulation protocol: Yes  Plan:  - Give Warfarin 2.5 mg PO daily - Daily PT/INR - Monitor CBC, s/sx of bleeding   Gwenlyn Perking, PharmD PGY1 Pharmacy Resident Pager: [phone] 10/09/2016 9:34 AM

## 2016-10-09 NOTE — Progress Notes (Signed)
Orthopedic Tech Progress Note Patient Details:  Stephen Nixon 11-Aug-1941 [id number]  CPM Left Knee CPM Left Knee: Off Left Knee Flexion (Degrees): 55 Left Knee Extension (Degrees): 0 Additional Comments:  (increased to 55 degrees at 2000, tolerated increase) CPM Right Knee CPM Right Knee: On Right Knee Flexion (Degrees): 55 Right Knee Extension (Degrees): 0 Additional Comments:  (increased to 16)   [name] 10/09/2016, 6:20 PM

## 2016-10-09 NOTE — IPOC Note (Addendum)
Overall Plan of Care [facility]) Patient Details Name: Stephen Nixon MRN: [id number] DOB: 09-11-1941  Admitting Diagnosis: B TKR   Hospital Problems: Principal Problem:   S/P TKR (total knee replacement), bilateral Active Problems:   Primary osteoarthritis of both knees   Postoperative anemia due to acute blood loss   Leukocytosis   Primary osteoarthritis of knees, bilateral   Post-operative pain   Acute blood loss anemia   Hypokalemia   Constipation due to pain medication   SIRS (systemic inflammatory response syndrome) (HCC)     Functional Problem List: Nursing Pain, Bladder, Bowel, Edema, Medication Management, Nutrition, Skin Integrity, Safety  PT Balance, Endurance, Motor, Pain, Safety, Sensory  OT Pain, Safety, Balance, Endurance  SLP    TR         Basic ADL's: OT Bathing, Dressing, Toileting     Advanced  ADL's: OT       Transfers: PT Bed Mobility, Bed to Chair, Car, Manufacturing systems engineer, Metallurgist: PT Ambulation, Emergency planning/management officer, Stairs     Additional Impairments: OT None  SLP        TR      Anticipated Outcomes Item Anticipated Outcome  Self Feeding Independent  Swallowing      Basic self-care  Mod I  Toileting  Mod I   Bathroom Transfers Mod I  Bowel/Bladder  Continent bladder, no retention, no s/s infection at D/C; Continent bowel, BM Q day, QOD with laxative as needed.  Transfers  Mod I  Locomotion  Mod I  Communication     Cognition     Pain  Pain managed at goal 2/10. Utilizes alternative methods of pain control; ice, repositionng.  Safety/Judgment  Increased safety awareness, no falls, no injury this admission. Follows precautions.   Therapy Plan: PT Intensity: Minimum of 1-2 x/day ,45 to 90 minutes PT Frequency: 5 out of 7 days PT Duration Estimated Length of Stay: 7-10 days OT Intensity: Minimum of 1-2 x/day, 45 to 90 minutes OT Frequency: 5 out of 7 days OT Duration/Estimated Length of Stay: 5-7 dats          Team Interventions: Nursing Interventions Patient/Family Education, Bladder Management, Bowel Management, Medication Management, Discharge Planning, Skin Care/Wound Management, Pain Management, Psychosocial Support  PT interventions Community reintegration, Ambulation/gait training, DME/adaptive equipment instruction, Neuromuscular re-education, Stair training, UE/LE Strength taining/ROM, Wheelchair propulsion/positioning, UE/LE Coordination activities, Therapeutic Activities, Skin care/wound management, Pain management, Discharge planning, Training and development officer, Disease management/prevention, Functional mobility training, Patient/family education, Therapeutic Exercise  OT Interventions Therapeutic Exercise, Therapeutic Activities, Self Care/advanced ADL retraining, Pain management, Discharge planning, Functional mobility training, Patient/family education, DME/adaptive equipment instruction, Training and development officer  SLP Interventions    TR Interventions    SW/CM Interventions Discharge Planning, Psychosocial Support, Patient/Family Education    Team Discharge Planning: Destination: PT-Home ,OT- Home , SLP-  Projected Follow-up: PT-Home health PT, Outpatient PT (versus), OT-  None, SLP-  Projected Equipment Needs: PT-3 in 1 bedside comode, Rolling walker with 5" wheels, OT- To be determined, SLP-  Equipment Details: PT- , OT-  Patient/family involved in discharge planning: PT- Patient,  OT-Patient, SLP-   MD ELOS: 6-9 days. Medical Rehab Prognosis:  Excellent Assessment:  75 year old male with bilateral knee OA with pain and with failure of conservative therapy. He elected to undergo B-TKR on 10/04/16 by Dr. Wynelle Link. Post op WBAT and epidural removed on 10/27. Pt with ABLA, leukocytosis, and fevers. Therapy ongoing and patient limited by pain and fatigue, and knee mobility.  Will set goals for Mod I with PT/OT.   See Team Conference Notes for weekly updates to the plan of  care

## 2016-10-09 NOTE — Progress Notes (Signed)
Orthopedic Tech Progress Note Patient Details:  Stephen Nixon [birth year] [id number] The additional comments on these notes are from the nurse that did interventions 10/08/16 at 2000. I put patient in bilateral CPM's at 1615. CPM Left Knee CPM Left Knee: On Left Knee Flexion (Degrees): 55 Left Knee Extension (Degrees): 0 Additional Comments:  (increased to 55 degrees at 2000, tolerated increase) CPM Right Knee CPM Right Knee: On Right Knee Flexion (Degrees): 55 Right Knee Extension (Degrees): 0 Additional Comments:  (increased to 6)   [name] 10/09/2016, 4:30 PM

## 2016-10-09 NOTE — Progress Notes (Signed)
Patient information reviewed and entered into eRehab system by Jourdyn Ferrin, RN, CRRN, PPS Coordinator.  Information including medical coding and functional independence measure will be reviewed and updated through discharge.     Per nursing patient was given "Data Collection Information Summary for Patients in Inpatient Rehabilitation Facilities with attached "Privacy Act Statement-Health Care Records" upon admission.  

## 2016-10-09 NOTE — Progress Notes (Signed)
[place] PHYSICAL MEDICINE & REHABILITATION     PROGRESS NOTE    Subjective/Complaints: Pt sitting up in bed this AM.  He slept well overnight.  He notes he feels improvement daily.  He notes constipation.  ROS: +Constipation. Denies CP, SOB, N/V/D Objective: Vital Signs: Blood pressure 130/72, pulse (!) 109, temperature 99.7 F (37.6 C), temperature source Oral, resp. rate 18, height 5\' 10"  (1.778 m), weight 79.7 kg (175 lb 11.2 oz), SpO2 97 %. No results found.  Recent Labs  10/07/16 0458 10/08/16 0507  WBC 16.5* 13.8*  HGB 9.9* 10.2*  HCT 28.9* 30.0*  PLT 215 242    Recent Labs  10/08/16 0507 10/09/16 0703  NA 137 135  K 3.1* 2.9*  CL 99* 97*  GLUCOSE 97 102*  BUN 10 14  CREATININE 0.75 0.76  CALCIUM 8.1* 7.9*   CBG (last 3)  No results for input(s): GLUCAP in the last 72 hours.  Wt Readings from Last 3 Encounters:  10/07/16 79.7 kg (175 lb 11.2 oz)  10/04/16 73.8 kg (162 lb 12 oz)  09/27/16 73.8 kg (162 lb 12.8 oz)    Physical Exam:  Constitutional: He appears well-developed and well-nourished. No distress. Vital signs reviewed.  HENT: Normocephalic and atraumatic.  Eyes: EOMI.  No discharge.  Cardiovascular: +Tachycardia. Regular rhythm.  No JVD. Respiratory: Effort normal and breath sounds normal. No stridor. No respiratory distress. He has no wheezes or rales.  GI: Soft. Bowel sounds are normal. He exhibits no distension. There is no tenderness.  Musculoskeletal: He exhibits edema and tenderness.  Knee PROM 45 degrees.   Neurological: He is alert and oriented. No cranial nerve deficit. Coordination normal.  B/l UE 5/5.  LE: Hip flexion 4-/5 (pain inhibition), 4+/5 ADF/PF  Skin: Skin is warm and dry. He is not diaphoretic. Slight erythema surrounding knee incisions  Psychiatric: He has a normal mood and affect. His behavior is normal. Thought content normal.   Assessment/Plan: 1. Functional and mobility deficits secondary to bilateral endstage  OA/bilateral TKA's which require 3+ hours per day of interdisciplinary therapy in a comprehensive inpatient rehab setting. Physiatrist is providing close team supervision and 24 hour management of active medical problems listed below. Physiatrist and rehab team continue to assess barriers to discharge/monitor patient progress toward functional and medical goals.  Function:  Bathing Bathing position   Position: Wheelchair/chair at sink  Bathing parts Body parts bathed by patient: Right arm, Left arm, Chest, Abdomen, Front perineal area, Right upper leg, Left upper leg Body parts bathed by helper: Buttocks, Back  Bathing assist Assist Level: Touching or steadying assistance(Pt > 75%)      Upper Body Dressing/Undressing Upper body dressing   What is the patient wearing?: Pull over shirt/dress     Pull over shirt/dress - Perfomed by patient: Thread/unthread right sleeve, Thread/unthread left sleeve, Put head through opening, Pull shirt over trunk          Upper body assist Assist Level: Set up   Set up : To obtain clothing/put away  Lower Body Dressing/Undressing Lower body dressing   What is the patient wearing?: Pants, Non-skid slipper socks       Pants- Performed by helper: Thread/unthread right pants leg, Thread/unthread left pants leg, Pull pants up/down   Non-skid slipper socks- Performed by helper: Don/doff right sock, Don/doff left sock                  Lower body assist  Toileting Toileting Toileting activity did not occur: No continent bowel/bladder event Toileting steps completed by patient: Adjust clothing prior to toileting, Performs perineal hygiene, Adjust clothing after toileting      Toileting assist Assist level: Supervision or verbal cues   Transfers Chair/bed transfer   Chair/bed transfer method: Stand pivot Chair/bed transfer assist level: Touching or steadying assistance (Pt > 75%) Chair/bed transfer assistive device: Environmental health practitioner     Max distance: 65 ft Assist level: Touching or steadying assistance (Pt > 75%)   Wheelchair   Type: Manual Max wheelchair distance: 150 ft Assist Level: Supervision or verbal cues  Cognition Comprehension Comprehension assist level: Follows complex conversation/direction with no assist  Expression Expression assist level: Expresses complex ideas: With no assist  Social Interaction Social Interaction assist level: Interacts appropriately with others with medication or extra time (anti-anxiety, antidepressant).  Problem Solving Problem solving assist level: Solves complex problems: Recognizes & self-corrects  Memory Memory assist level: Complete Independence: No helper   Medical Problem List and Plan: 1.  Functional and mobility deficits secondary to endstage OA of bilateral knees s/p bilateral TKA's.  Cont CIR 2.  DVT Prophylaxis/Anticoagulation: Pharmaceutical: Coumadin   INR therapeutic on 10/30 3. Pain Management: Encouraged to use robaxin for muscle spasms.   Oxycodone prn, weaned 10/30 4. Mood: LCSW to follow for evaluation  And support.  5. Neuropsych: This patient is capable of making decisions on his own behalf. 6. Skin/Wound Care:  Routine pressure relief measures. Monitor wound for healing.  7. Fluids/Electrolytes/Nutrition: Monitor I/O. still 8. ABLA: On iron bid.   Hb 10.2 on 10/29  Cont to monitor  9. Leukocytosis: ?UTI (UA equivocal),   -empiric keflex started. ucx pending. 10 Nausea: Resolved  advanced to regular diet 11. Fevers  Likely reactive  Encourage IS  1 fever yesterday, will cont to monitor 12. Hypokalemia  K+ 2.9 on 10/30  Supplemented 13. Opoid induced Constipation  Bowel reg increased on 10/30  LOS (Days) 2 A FACE TO FACE EVALUATION WAS PERFORMED  Gal Smolinski Lorie Phenix 10/09/2016 9:17 AM

## 2016-10-09 NOTE — Progress Notes (Signed)
Bilateral CPM from 1830-2130, Increased CPM to 55 degrees, tolerated without complaint of. Up to BR, continent, large BM. Using [name] A

## 2016-10-09 NOTE — Progress Notes (Signed)
*  PRELIMINARY RESULTS* Vascular Ultrasound Bilateral Lower Extreminty Duplex has been completed.  Preliminary findings: No evidence of deep vein thrombosis or baker's cysts bilaterally.   Everrett Coombe 10/09/2016, 12:09 PM

## 2016-10-09 NOTE — Progress Notes (Signed)
Reesa Chew, PA notified of continuing elevated temperature, HR and rash on back.

## 2016-10-10 ENCOUNTER — Inpatient Hospital Stay (HOSPITAL_COMMUNITY): Payer: Medicare Other | Admitting: Occupational Therapy

## 2016-10-10 ENCOUNTER — Inpatient Hospital Stay (HOSPITAL_COMMUNITY): Payer: Medicare Other | Admitting: Physical Therapy

## 2016-10-10 DIAGNOSIS — R509 Fever, unspecified: Secondary | ICD-10-CM | POA: Diagnosis present

## 2016-10-10 DIAGNOSIS — R5082 Postprocedural fever: Secondary | ICD-10-CM

## 2016-10-10 LAB — CBC WITH DIFFERENTIAL/PLATELET
BASOS PCT: 0 %
Basophils Absolute: 0 10*3/uL (ref 0.0–0.1)
EOS ABS: 0.4 10*3/uL (ref 0.0–0.7)
EOS PCT: 3 %
HCT: 27 % — ABNORMAL LOW (ref 39.0–52.0)
HEMOGLOBIN: 9.2 g/dL — AB (ref 13.0–17.0)
LYMPHS PCT: 17 %
Lymphs Abs: 2.1 10*3/uL (ref 0.7–4.0)
MCH: 31 pg (ref 26.0–34.0)
MCHC: 34.1 g/dL (ref 30.0–36.0)
MCV: 90.9 fL (ref 78.0–100.0)
MONO ABS: 2.2 10*3/uL — AB (ref 0.1–1.0)
Monocytes Relative: 18 %
NEUTROS ABS: 7.7 10*3/uL (ref 1.7–7.7)
NEUTROS PCT: 62 %
Platelets: 406 10*3/uL — ABNORMAL HIGH (ref 150–400)
RBC: 2.97 MIL/uL — ABNORMAL LOW (ref 4.22–5.81)
RDW: 13.6 % (ref 11.5–15.5)
WBC: 12.4 10*3/uL — ABNORMAL HIGH (ref 4.0–10.5)

## 2016-10-10 LAB — PROTIME-INR
INR: 2.66
Prothrombin Time: 28.9 seconds — ABNORMAL HIGH (ref 11.4–15.2)

## 2016-10-10 LAB — BASIC METABOLIC PANEL
Anion gap: 8 (ref 5–15)
BUN: 13 mg/dL (ref 6–20)
CHLORIDE: 99 mmol/L — AB (ref 101–111)
CO2: 30 mmol/L (ref 22–32)
Calcium: 8.2 mg/dL — ABNORMAL LOW (ref 8.9–10.3)
Creatinine, Ser: 0.76 mg/dL (ref 0.61–1.24)
GFR calc Af Amer: >60 mL/min (ref >60)
GFR calc non Af Amer: >60 mL/min (ref >60)
Glucose, Bld: 103 mg/dL — ABNORMAL HIGH (ref 65–99)
POTASSIUM: 3.7 mmol/L (ref 3.5–5.1)
SODIUM: 137 mmol/L (ref 135–145)

## 2016-10-10 NOTE — Progress Notes (Signed)
ANTICOAGULATION CONSULT NOTE - Follow Up Consult  Pharmacy Consult for coumadin Indication: VTE prophylaxis  Allergies  Allergen Reactions  . Tylenol [Acetaminophen] Rash    Patient Measurements: Height: 5\' 10"  (177.8 cm) Weight: 175 lb 11.2 oz (79.7 kg) (pt stated 160lbs) IBW/kg (Calculated) : 73 Heparin Dosing Weight:   Vital Signs: Temp: 99.5 F (37.5 C) (10/31 0537) Temp Source: Oral (10/31 0537) BP: 130/71 (10/31 0537) Pulse Rate: 106 (10/31 0537)  Labs:  Recent Labs  10/08/16 0507 10/09/16 0703 10/10/16 0529  HGB 10.2*  --  9.2*  HCT 30.0*  --  27.0*  PLT 242  --  406*  LABPROT 29.4* 25.5* 28.9*  INR 2.72 2.27 2.66  CREATININE 0.75 0.76 0.76    Estimated Creatinine Clearance: 84.9 mL/min (by C-G formula based on SCr of 0.76 mg/dL).   Medications:  Scheduled:  . camphor-menthol   Topical TID AC  . iron polysaccharides  150 mg Oral Daily  . polyethylene glycol  17 g Oral BID  . potassium chloride  30 mEq Oral TID  . senna-docusate  1 tablet Oral BID  . warfarin  2.5 mg Oral q1800  . Warfarin - Pharmacist Dosing Inpatient   Does not apply q1800   Infusions:    Assessment: 75 yo male s/p ortho surgery is currently on therapeutic coumadin for VTE prophylaxis.  INR today is 2.66.  Goal of Therapy:  INR 2-3 Monitor platelets by anticoagulation protocol: Yes   Plan:  - continue coumadin 2.5 mg po daily - INR in am  Nehemiah Montee, Tsz-Yin 10/10/2016,8:19 AM

## 2016-10-10 NOTE — Progress Notes (Signed)
Section PHYSICAL MEDICINE & REHABILITATION     PROGRESS NOTE    Subjective/Complaints: Pt sitting up in his chair this AM.  He states he slept well.  He had a BM this AM.  He notes persistent intermittent fevers.  ROS: +Intermittent fevers. Denies CP, SOB, N/V/D Objective: Vital Signs: Blood pressure 130/71, pulse (!) 106, temperature 99.5 F (37.5 C), temperature source Oral, resp. rate 18, height 5\' 10"  (1.778 m), weight 79.7 kg (175 lb 11.2 oz), SpO2 97 %. Dg Chest 2 View  Result Date: 10/09/2016 CLINICAL DATA:  Elevated temperature and heart rate EXAM: CHEST  2 VIEW COMPARISON:  None. FINDINGS: Mild diffuse interstitial prominence greatest in the lung bases, suspect chronic change. No acute infiltrate or effusion. Borderline heart size. No overt edema. No pneumothorax. IMPRESSION: 1. No focal infiltrate 2. Borderline heart size 3. Prominent interstitial opacities could relate to chronic change. Electronically Signed   By: Donavan Foil M.D.   On: 10/09/2016 22:18    Recent Labs  10/08/16 0507 10/10/16 0529  WBC 13.8* 12.4*  HGB 10.2* 9.2*  HCT 30.0* 27.0*  PLT 242 406*    Recent Labs  10/09/16 0703 10/10/16 0529  NA 135 137  K 2.9* 3.7  CL 97* 99*  GLUCOSE 102* 103*  BUN 14 13  CREATININE 0.76 0.76  CALCIUM 7.9* 8.2*   CBG (last 3)  No results for input(s): GLUCAP in the last 72 hours.  Wt Readings from Last 3 Encounters:  10/07/16 79.7 kg (175 lb 11.2 oz)  10/04/16 73.8 kg (162 lb 12 oz)  09/27/16 73.8 kg (162 lb 12.8 oz)    Physical Exam:  Constitutional: He appears well-developed and well-nourished. No distress. Vital signs reviewed.  HENT: Normocephalic and atraumatic.  Eyes: EOMI.  No discharge.  Cardiovascular: +Tachycardia. Regular rhythm.  No JVD. Respiratory: Effort normal and breath sounds normal. No stridor. No respiratory distress. He has no wheezes or rales.  GI: Soft. Bowel sounds are normal. He exhibits no distension. There is no  tenderness.  Musculoskeletal: He exhibits edema and tenderness.  Knee APROM ~60 degrees.   Neurological: He is alert and oriented. No cranial nerve deficit. Coordination normal.  B/l UE 5/5.  LE: Hip flexion 4-/5 (pain inhibition), knee extension 3/5, 4+/5 ADF/PF  Skin: Skin is warm and dry. He is not diaphoretic. Slight erythema surrounding knee incisions  Psychiatric: He has a normal mood and affect. His behavior is normal. Thought content normal.   Assessment/Plan: 1. Functional and mobility deficits secondary to bilateral endstage OA/bilateral TKA's which require 3+ hours per day of interdisciplinary therapy in a comprehensive inpatient rehab setting. Physiatrist is providing close team supervision and 24 hour management of active medical problems listed below. Physiatrist and rehab team continue to assess barriers to discharge/monitor patient progress toward functional and medical goals.  Function:  Bathing Bathing position   Position: Shower  Bathing parts Body parts bathed by patient: Right arm, Left arm, Chest, Abdomen, Front perineal area, Buttocks, Right upper leg, Left upper leg Body parts bathed by helper: Back  Bathing assist Assist Level: Touching or steadying assistance(Pt > 75%)      Upper Body Dressing/Undressing Upper body dressing   What is the patient wearing?: Pull over shirt/dress     Pull over shirt/dress - Perfomed by patient: Thread/unthread right sleeve, Thread/unthread left sleeve, Put head through opening, Pull shirt over trunk          Upper body assist Assist Level: Set up  Set up : To obtain clothing/put away  Lower Body Dressing/Undressing Lower body dressing   What is the patient wearing?: Pants, Non-skid slipper socks, Ted Hose     Pants- Performed by patient: Thread/unthread right pants leg, Thread/unthread left pants leg, Pull pants up/down Pants- Performed by helper: Thread/unthread right pants leg, Thread/unthread left pants leg, Pull  pants up/down   Non-skid slipper socks- Performed by helper: Don/doff right sock, Don/doff left sock               TED Hose - Performed by helper: Don/doff right TED hose, Don/doff left TED hose  Lower body assist Assist for lower body dressing: Touching or steadying assistance (Pt > 75%)      Toileting Toileting Toileting activity did not occur: No continent bowel/bladder event Toileting steps completed by patient: Adjust clothing prior to toileting, Performs perineal hygiene, Adjust clothing after toileting      Toileting assist Assist level: Supervision or verbal cues   Transfers Chair/bed transfer   Chair/bed transfer method: Ambulatory Chair/bed transfer assist level: Touching or steadying assistance (Pt > 75%) Chair/bed transfer assistive device: Medical sales representative     Max distance: 150 Assist level: Touching or steadying assistance (Pt > 75%)   Wheelchair   Type: Manual Max wheelchair distance: 150 ft Assist Level: Supervision or verbal cues  Cognition Comprehension Comprehension assist level: Follows complex conversation/direction with no assist  Expression Expression assist level: Expresses complex ideas: With no assist  Social Interaction Social Interaction assist level: Interacts appropriately with others with medication or extra time (anti-anxiety, antidepressant).  Problem Solving Problem solving assist level: Solves complex problems: Recognizes & self-corrects  Memory Memory assist level: Complete Independence: No helper   Medical Problem List and Plan: 1.  Functional and mobility deficits secondary to endstage OA of bilateral knees s/p bilateral TKA's.  Cont CIR 2.  DVT Prophylaxis/Anticoagulation: Pharmaceutical: Coumadin   INR therapeutic on 10/31 3. Pain Management: Encouraged to use robaxin for muscle spasms.   Oxycodone prn, weaned 10/30 4. Mood: LCSW to follow for evaluation  And support.  5. Neuropsych: This patient is capable  of making decisions on his own behalf. 6. Skin/Wound Care:  Routine pressure relief measures. Monitor wound for healing.  7. Fluids/Electrolytes/Nutrition: Monitor I/O. still 8. ABLA: On iron bid.   Hb 9.2 on 10/31  Cont to monitor  9. Leukocytosis:   WBCs 12.4 on 10/31 (improving)  UTI (UA equivocal), Ucx with insignificant growth  CXR 10/30 reviewed, unremarkable for acute process  Empiric keflex started d/ced 10/30  Blood cultures ordered  Respiratory panel ordered 10 Nausea: Resolved  advanced to regular diet 11. Fevers  Likely reactive  1 fever again yesterday, will cont to monitor 12. Hypokalemia  K+ 3.7 on 10/31  Supplemented 13. Opoid induced Constipation  Bowel reg increased on 10/30  Improving  LOS (Days) 3 A FACE TO FACE EVALUATION WAS PERFORMED  Ankit Lorie Phenix 10/10/2016 8:35 AM

## 2016-10-10 NOTE — Progress Notes (Signed)
Occupational Therapy Session Note  Patient Details  Name: Stephen Nixon MRN: [id number] Date of Birth: 07-29-1941  Today's Date: 10/10/2016 OT Individual Time: 1000-1100 OT Individual Time Calculation (min): 60 min     Short Term Goals: Week 1:  OT Short Term Goal 1 (Week 1): STG = LTG d/t short anticipated LOS.  Skilled Therapeutic Interventions/Progress Updates:    Upon entering the room, pt seated in wheelchair awaiting therapist with 4/10 c/o pain in B knees but agreeable to OT intervention. Phlebotomy tech arrived and needed pt to return to bed. He performed stand pivot transfer with RW and steady assist to return to bed. OT provided education and wrapped B knees as requested from PA. Pt returned to wheelchair and propelled to tub room secondary to time management.OT educated and demonstrated use of RW for transfer onto TTB in tub shower combination. Pt returned demonstration with min A for balance and min verbal cues for proper technique. Pt able to lift B LE's over into and over tub ledge. Pt ambulated with RW and steady assist 80' back to room. Pt returning to bed secondary to fatigue. Call bell and all needed items within reach upon exiting the room.   Therapy Documentation Precautions:  Precautions Precautions: Fall, Knee Required Braces or Orthoses: Knee Immobilizer - Right, Knee Immobilizer - Left Knee Immobilizer - Right: Discontinue once straight leg raise with < 10 degree lag Knee Immobilizer - Left: Discontinue once straight leg raise with < 10 degree lag Restrictions Weight Bearing Restrictions: No RLE Weight Bearing: Weight bearing as tolerated LLE Weight Bearing: Weight bearing as tolerated General:   Vital Signs:  Pain: Pain Assessment Pain Assessment: 0-10 Pain Score: 4  Pain Type: Acute pain Pain Location: Knee Pain Orientation: Right;Left Pain Descriptors / Indicators: Aching;Sore ADL: ADL ADL Comments: see Functional Assessment Tool Exercises:    Other Treatments:    See Function Navigator for Current Functional Status.   Therapy/Group: Individual Therapy  Phineas Semen 10/10/2016, 12:43 PM

## 2016-10-10 NOTE — Progress Notes (Signed)
Occupational Therapy Session Note  Patient Details  Name: Stephen Nixon MRN: [id number] Date of Birth: Aug 22, 1941  Today's Date: 10/10/2016 OT Individual Time: [id number] OT Individual Time Calculation (min): 45 min     Short Term Goals:No short term goals set  Skilled Therapeutic Interventions/Progress Updates:    Pt seen this session to work on donning pants without AE to work on knee flexibility. Pt stated he washed up at sink and completed grooming.  Donned shirt and pants without reacher.  Assist to don shoes.  Close S to stand to RW.  Ambulated with RW to gym with close S. In gym sat edge of mat to work on AROM of knees with towel slides on floor. Transferred back to w/c with stand pivot to w/c. Propelled self back to room.  Therapy Documentation Precautions:  Precautions Precautions: Fall, Knee Required Braces or Orthoses: Knee Immobilizer - Right, Knee Immobilizer - Left Knee Immobilizer - Right: Discontinue once straight leg raise with < 10 degree lag Knee Immobilizer - Left: Discontinue once straight leg raise with < 10 degree lag Restrictions Weight Bearing Restrictions: No RLE Weight Bearing: Weight bearing as tolerated LLE Weight Bearing: Weight bearing as tolerated    Vital Signs: Therapy Vitals Temp: 99.5 F (37.5 C) Temp Source: Oral Pulse Rate: (!) 106 Resp: 18 BP: 130/71 Patient Position (if appropriate): Lying Oxygen Therapy SpO2: 97 % O2 Device: Not Delivered Pain: Pain Assessment Pain Assessment: 0-10 Pain Score: 1  Pain Type: Acute pain Pain Location: Knee Pain Orientation: Right;Left Pain Descriptors / Indicators: Aching;Sore Pain Onset: Awakened from sleep Pain Intervention(s): Medication (See eMAR) ADL: ADL ADL Comments: see Functional Assessment Tool   See Function Navigator for Current Functional Status.   Therapy/Group: Individual Therapy  [place] 10/10/2016, 8:24 AM

## 2016-10-10 NOTE — Progress Notes (Signed)
Physical Therapy Session Note  Patient Details  Name: Stephen Nixon MRN: [id number] Date of Birth: 1941/11/07  Today's Date: 10/10/2016 PT Individual Time: 1435-1610 PT Individual Time Calculation (min): 95 min    Short Term Goals: Week 1:  PT Short Term Goal 1 (Week 1): STG = LTG Due to short ELOS  Skilled Therapeutic Interventions/Progress Updates:  Due to pt ability to perform bilat SLR, KI D/C today.  Pt received in bed with wife present.  Pt now with ace wraps on bilat LE to assist with edema management.  Pt performed supine > sit with supervision.  Assisted pt with donning shoes and performed sit > stand with min A to stabilize RW as pt pulled each foot back under BOS.  Performed gait with RW x 150' with supervision initially with step to sequence with LE fully straight progressing to step through sequence with focus on knee flexion during swing phase.  Pt transitioned to Nustep where pt performed AAROM to each LE flexion<>extension x 8 minutes; at end of 8 minutes pt able to reach: R knee: -25 deg to full extension to 68 deg flexion, L knee -20 deg to full extension to 70 deg flexion.  Performed sit > stand and ambulated to mat with RW and supervision with cues for increased knee flexion during swing phase.  Performed sit > supine with supervision and reviewed HEP on firm mat.  Performed supine > sit with supervision and ambulated back to room with RW with pt continuing to swing through on UE and requiring verbal cues for hip/knee flexion for more normalized gait pattern.  Pt performed sit > supine on bed with supervision with ice packs placed over bilat knees.      Therapy Documentation Precautions:  Precautions Precautions: Fall, Knee Required Braces or Orthoses: Knee Immobilizer - Right, Knee Immobilizer - Left Knee Immobilizer - Right: Discontinue once straight leg raise with < 10 degree lag Knee Immobilizer - Left: Discontinue once straight leg raise with < 10 degree  lag Restrictions Weight Bearing Restrictions: Yes RLE Weight Bearing: Weight bearing as tolerated LLE Weight Bearing: Weight bearing as tolerated Vital Signs: Therapy Vitals Temp: 98.7 F (37.1 C) Temp Source: Oral Pulse Rate: (!) 108 Resp: 15 BP: 122/65 Patient Position (if appropriate): Lying Oxygen Therapy SpO2: 98 % O2 Device: Not Delivered   See Function Navigator for Current Functional Status.   Therapy/Group: Individual Therapy  Raylene Everts [facility] 10/10/2016, 4:26 PM

## 2016-10-11 ENCOUNTER — Inpatient Hospital Stay (HOSPITAL_COMMUNITY): Payer: Medicare Other | Admitting: Physical Therapy

## 2016-10-11 ENCOUNTER — Inpatient Hospital Stay (HOSPITAL_COMMUNITY): Payer: Medicare Other | Admitting: Occupational Therapy

## 2016-10-11 DIAGNOSIS — L740 Miliaria rubra: Secondary | ICD-10-CM | POA: Insufficient documentation

## 2016-10-11 DIAGNOSIS — D72820 Lymphocytosis (symptomatic): Secondary | ICD-10-CM

## 2016-10-11 LAB — RESPIRATORY PANEL BY PCR
ADENOVIRUS-RVPPCR: NOT DETECTED
BORDETELLA PERTUSSIS-RVPCR: NOT DETECTED
CHLAMYDOPHILA PNEUMONIAE-RVPPCR: NOT DETECTED
CORONAVIRUS 229E-RVPPCR: NOT DETECTED
CORONAVIRUS HKU1-RVPPCR: NOT DETECTED
CORONAVIRUS NL63-RVPPCR: NOT DETECTED
Coronavirus OC43: NOT DETECTED
Influenza A: NOT DETECTED
Influenza B: NOT DETECTED
MYCOPLASMA PNEUMONIAE-RVPPCR: NOT DETECTED
Metapneumovirus: NOT DETECTED
Parainfluenza Virus 1: NOT DETECTED
Parainfluenza Virus 2: NOT DETECTED
Parainfluenza Virus 3: NOT DETECTED
Parainfluenza Virus 4: NOT DETECTED
RHINOVIRUS / ENTEROVIRUS - RVPPCR: NOT DETECTED
Respiratory Syncytial Virus: NOT DETECTED

## 2016-10-11 LAB — PROTIME-INR
INR: 2.4
PROTHROMBIN TIME: 26.6 s — AB (ref 11.4–15.2)

## 2016-10-11 MED ORDER — POLYETHYLENE GLYCOL 3350 17 G PO PACK
17.0000 g | PACK | Freq: Three times a day (TID) | ORAL | Status: DC
  Administered 2016-10-11 – 2016-10-14 (×7): 17 g via ORAL
  Filled 2016-10-11 (×10): qty 1

## 2016-10-11 NOTE — Progress Notes (Signed)
Occupational Therapy Session Note  Patient Details  Name: Stephen Nixon MRN: [id number] Date of Birth: [birth year]  Today's Date: 10/11/2016 OT Individual Time: 0800-0900 OT Individual Time Calculation (min): 60 min     Short Term Goals: Week 1:  OT Short Term Goal 1 (Week 1): STG = LTG d/t short anticipated LOS.  Skilled Therapeutic Interventions/Progress Updates:     Upon entering the room, pt on toilet attempting to have BM. Pt with 4/10 c/o pain in B knees but agreeable to OT intervention. Pt standing from elevated toilet for hygiene with steady assistance and ambulating to shower for bathing. Pt completed bathing at shower level with use of long handled sponge to increased LB bathing. Pt performed lateral leans to wash buttocks. Pt returns to wheelchair for grooming tasks from seated position. Pt engaged in sit <>stand for LB clothing management with min A.Pt returning to bed at end of session with call bell and all needed items within reach.         Therapy Documentation Precautions:  Precautions Precautions: Fall, Knee Required Braces or Orthoses: Knee Immobilizer - Right, Knee Immobilizer - Left Knee Immobilizer - Right: Discontinue once straight leg raise with < 10 degree lag Knee Immobilizer - Left: Discontinue once straight leg raise with < 10 degree lag Restrictions Weight Bearing Restrictions: Yes RLE Weight Bearing: Weight bearing as tolerated LLE Weight Bearing: Weight bearing as tolerated ADL: ADL ADL Comments: see Functional Assessment Tool  See Function Navigator for Current Functional Status.   Therapy/Group: Individual Therapy  Phineas Semen, MS, OTR/L 10/11/2016, 12:40 PM

## 2016-10-11 NOTE — Care Management Note (Signed)
[facility] Individual Statement of Services  Patient Name:  Stephen Nixon  Date:  10/09/2016  Welcome to the [facility].  Our goal is to provide you with an individualized program based on your diagnosis and situation, designed to meet your specific needs.  With this comprehensive rehabilitation program, you will be expected to participate in at least 3 hours of rehabilitation therapies Monday-Friday, with modified therapy programming on the weekends.  Your rehabilitation program will include the following services:  Physical Therapy (PT), Occupational Therapy (OT), 24 hour per day rehabilitation nursing, Case Management (Social Worker), Rehabilitation Medicine, Nutrition Services and Pharmacy Services  Weekly team conferences will be held on Wednesdays to discuss your progress.  Your Social Worker will talk with you frequently to get your input and to update you on team discussions.  Team conferences with you and your family in attendance may also be held.  Expected length of stay: 7-10 days  Overall anticipated outcome: modified independent  Depending on your progress and recovery, your program may change. Your Social Worker will coordinate services and will keep you informed of any changes. Your Social Worker's name and contact numbers are listed  below.  The following services may also be recommended but are not provided by the [facility] will be made to provide these services after discharge if needed.  Arrangements include referral to agencies that provide these services.  Your insurance has been verified to be:  Medicare and [employer] Your primary doctor is:  Dr. Daryel Gerald  Pertinent information will be shared with your doctor and your insurance company.  Social Worker:  [name], [place] or [phone]   Information discussed with and copy given to patient by: [name], 10/11/2016, 9:19 AM

## 2016-10-11 NOTE — Progress Notes (Signed)
Social Work  Social Work Assessment and Plan  Patient Details  Name: Stephen Nixon MRN: [id number] Date of Birth: June 05, 1941  Today's Date: 10/09/2016  Problem List:  Patient Active Problem List   Diagnosis Date Noted  . Heat rash   . Fever   . Post-operative pain   . Acute blood loss anemia   . Hypokalemia   . Constipation due to pain medication   . SIRS (systemic inflammatory response syndrome) (HCC)   . S/P TKR (total knee replacement), bilateral 10/07/2016  . Postoperative anemia due to acute blood loss 10/07/2016  . Leukocytosis 10/07/2016  . Primary osteoarthritis of knees, bilateral 10/07/2016  . OA (osteoarthritis) of knee 10/04/2016  . Primary osteoarthritis of both knees 01/14/2016   Past Medical History:  Past Medical History:  Diagnosis Date  . Arthritis   . Closed fracture of left elbow with routine healing   . 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   . Impingement syndrome of left shoulder   . Measles   . Mumps    Past Surgical History:  Past Surgical History:  Procedure Laterality Date  . 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 TONSILLECTOMY    . TOTAL KNEE ARTHROPLASTY Bilateral 10/04/2016   Procedure: BILATERAL TOTAL KNEE ARTHROPLASTY;  Surgeon: Gaynelle Arabian, MD;  Location: [facility];  Service: Orthopedics;  Laterality: Bilateral;   Social History:  reports that he quit smoking about 49 years ago. His smoking use included Cigarettes. He has never used smokeless tobacco. He reports that he drinks alcohol. He reports that he does not use drugs.  Family / Support Systems Marital Status: Married Patient Roles: Spouse, Parent Spouse/Significant Other: wife, Devonaire Shingleton @ (C) [phone] Children: adult children Anticipated Caregiver: wife Ability/Limitations of Caregiver: Wife is retired and can assist. Careers adviser: 24/7 Family Dynamics: Pt describes wife as extremely supportive and able to assist.  Social  History Preferred language: English Religion:  Cultural Background: NA Education: college Read: Yes Write: Yes Employment Status: Retired Freight forwarder Issues: None Guardian/Conservator: None - per MD, pt is capable of making decisions on his own behalf.   Abuse/Neglect Physical Abuse: Denies Verbal Abuse: Denies Sexual Abuse: Denies Exploitation of patient/patient's resources: Denies Self-Neglect: Denies  Emotional Status Pt's affect, behavior adn adjustment status: Pt very pleasant, talkative and enjoyed sharing stories of his travel experiences.  He denies any emotional distress and reports he is pleased to have finally been able to have this surgery as he as many future travel trips planned. Recent Psychosocial Issues: None Pyschiatric History: None Substance Abuse History: None  Patient / Family Perceptions, Expectations & Goals Pt/Family understanding of illness & functional limitations: Pt and wife with good understaning of surgery performed and current functional limitations/ need for CIR. Premorbid pt/family roles/activities: Indpendent PTA but with some mobility limitations Anticipated changes in roles/activities/participation: None as pt with mod ind goals. Pt/family expectations/goals: "I just hope I can get through this with little set back."  [employer]: None Premorbid Home Care/DME Agencies: None Transportation available at discharge: yes  Discharge Planning Living Arrangements: Spouse/significant other Support Systems: Spouse/significant other, Other relatives, Friends/neighbors Type of Residence: Private residence Insurance underwriter Resources: [employer], Multimedia programmer (specify) Sports administrator) Financial Resources: Fish farm manager ([employer] benefits) Financial Screen Referred: No Living Expenses: Own Money Management: Patient Does the patient have any problems obtaining your medications?: No Home Management: pt and  wife Patient/Family Preliminary Plans: Pt to return home with wife  who can provide assistance as needed. Social Work Anticipated Follow Up Needs: HH/OP Expected length of stay: 7-10 days  Clinical Impression Very pleasant gentleman here following bil TKR and with mod independent goals.  Wife supportive and able to assist as needed.  He is motivated for CIR and denies any emotional distress.  Will follow for d/c planning needs.  Bettina Warn 10/09/2016, 3:16 PM

## 2016-10-11 NOTE — Progress Notes (Addendum)
[place] PHYSICAL MEDICINE & REHABILITATION     PROGRESS NOTE    Subjective/Complaints: Pt seen sitting up in bed this AM.  He states he slept well overngiht and is enjoying his time here.  He notes improvement with CPM.  He states Surgery PA came by yesterday and was pleased.  He also denies fevers x 24 hours.   ROS:  +Rash on back. Denies CP, SOB, N/V/D Objective: Vital Signs: Blood pressure 128/76, pulse (!) 104, temperature 98.9 F (37.2 C), temperature source Oral, resp. rate 18, height 5\' 10"  (1.778 m), weight 79.7 kg (175 lb 11.2 oz), SpO2 98 %. Dg Chest 2 View  Result Date: 10/09/2016 CLINICAL DATA:  Elevated temperature and heart rate EXAM: CHEST  2 VIEW COMPARISON:  None. FINDINGS: Mild diffuse interstitial prominence greatest in the lung bases, suspect chronic change. No acute infiltrate or effusion. Borderline heart size. No overt edema. No pneumothorax. IMPRESSION: 1. No focal infiltrate 2. Borderline heart size 3. Prominent interstitial opacities could relate to chronic change. Electronically Signed   By: Donavan Foil M.D.   On: 10/09/2016 22:18    Recent Labs  10/10/16 0529  WBC 12.4*  HGB 9.2*  HCT 27.0*  PLT 406*    Recent Labs  10/09/16 0703 10/10/16 0529  NA 135 137  K 2.9* 3.7  CL 97* 99*  GLUCOSE 102* 103*  BUN 14 13  CREATININE 0.76 0.76  CALCIUM 7.9* 8.2*   CBG (last 3)  No results for input(s): GLUCAP in the last 72 hours.  Wt Readings from Last 3 Encounters:  10/07/16 79.7 kg (175 lb 11.2 oz)  10/04/16 73.8 kg (162 lb 12 oz)  09/27/16 73.8 kg (162 lb 12.8 oz)    Physical Exam:  Constitutional: He appears well-developed and well-nourished. No distress. Vital signs reviewed.  HENT: Normocephalic and atraumatic.  Eyes: EOMI.  No discharge.  Cardiovascular: +Tachycardia. Regular rhythm.  No JVD. Respiratory: Effort normal and breath sounds normal. No stridor. No respiratory distress. He has no wheezes or rales.  GI: Soft. Bowel sounds  are normal. He exhibits no distension. There is no tenderness.  Musculoskeletal: He exhibits edema and mild tenderness.  Knee APROM ~65 degrees.   Neurological: He is alert and oriented. No cranial nerve deficit. Coordination normal.  B/l UE 5/5.  LE: Hip flexion 4-/5 (pain inhibition), knee extension 3/5, 4+/5 ADF/PF  Skin: Skin is warm and dry. He is not diaphoretic. Slight erythema surrounding knee incisions  Psychiatric: He has a normal mood and affect. His behavior is normal. Thought content normal.   Assessment/Plan: 1. Functional and mobility deficits secondary to bilateral endstage OA/bilateral TKA's which require 3+ hours per day of interdisciplinary therapy in a comprehensive inpatient rehab setting. Physiatrist is providing close team supervision and 24 hour management of active medical problems listed below. Physiatrist and rehab team continue to assess barriers to discharge/monitor patient progress toward functional and medical goals.  Function:  Bathing Bathing position   Position: Shower  Bathing parts Body parts bathed by patient: Right arm, Left arm, Chest, Abdomen, Front perineal area, Buttocks, Right upper leg, Left upper leg Body parts bathed by helper: Back  Bathing assist Assist Level: Touching or steadying assistance(Pt > 75%)      Upper Body Dressing/Undressing Upper body dressing   What is the patient wearing?: Pull over shirt/dress     Pull over shirt/dress - Perfomed by patient: Thread/unthread right sleeve, Thread/unthread left sleeve, Put head through opening, Pull shirt over trunk  Upper body assist Assist Level: Set up   Set up : To obtain clothing/put away  Lower Body Dressing/Undressing Lower body dressing   What is the patient wearing?: Shoes     Pants- Performed by patient: Thread/unthread right pants leg, Thread/unthread left pants leg, Pull pants up/down Pants- Performed by helper: Thread/unthread right pants leg, Thread/unthread  left pants leg, Pull pants up/down   Non-skid slipper socks- Performed by helper: Don/doff right sock, Don/doff left sock       Shoes - Performed by helper: Don/doff right shoe, Don/doff left shoe, Fasten right, Fasten left       TED Hose - Performed by helper: Don/doff right TED hose, Don/doff left TED hose  Lower body assist Assist for lower body dressing: Touching or steadying assistance (Pt > 75%)      Toileting Toileting   Toileting steps completed by patient: Adjust clothing prior to toileting, Performs perineal hygiene, Adjust clothing after toileting Toileting steps completed by helper: Adjust clothing prior to toileting, Performs perineal hygiene, Adjust clothing after toileting (per Gustavo Lah, NT assist past bm) Toileting Assistive Devices: Grab bar or Designer, multimedia Assist level: Supervision or verbal cues   Transfers Chair/bed transfer   Chair/bed transfer method: Ambulatory Chair/bed transfer assist level: Supervision or verbal cues Chair/bed transfer assistive device: Medical sales representative     Max distance: 150 Assist level: Supervision or verbal cues   Wheelchair   Type: Manual Max wheelchair distance: 150 ft Assist Level: Supervision or verbal cues  Cognition Comprehension Comprehension assist level: Follows complex conversation/direction with no assist  Expression Expression assist level: Expresses complex ideas: With no assist  Social Interaction Social Interaction assist level: Interacts appropriately with others with medication or extra time (anti-anxiety, antidepressant).  Problem Solving Problem solving assist level: Solves complex problems: Recognizes & self-corrects  Memory Memory assist level: Complete Independence: No helper   Medical Problem List and Plan: 1.  Functional and mobility deficits secondary to endstage OA of bilateral knees s/p bilateral TKA's.  Cont CIR 2.  DVT Prophylaxis/Anticoagulation: Pharmaceutical:  Coumadin   INR therapeutic on 11/1 3. Pain Management: Encouraged to use robaxin for muscle spasms.   Oxycodone prn, weaned 10/30 4. Mood: LCSW to follow for evaluation  And support.  5. Neuropsych: This patient is capable of making decisions on his own behalf. 6. Skin/Wound Care:  Routine pressure relief measures. Monitor wound for healing.  7. Fluids/Electrolytes/Nutrition: Monitor I/O. still 8. ABLA: On iron bid.   Hb 9.2 on 10/31  Cont to monitor  9. Leukocytosis:   WBCs 12.4 on 10/31 (improving)  UTI (UA equivocal), Ucx with insignificant growth  CXR 10/30 reviewed, unremarkable for acute process  Empiric keflex started d/ced 10/30  Blood cultures pending   Respiratory panel pending 10 Nausea: Resolved  advanced to regular diet 11. Fevers  Likely reactive  Afebrile x24 hours. 12. Hypokalemia  K+ 3.7 on 10/31  Supplemented 13. Opoid induced Constipation  Bowel reg increased on 10/30  Improving 14. Heat rash  On upper back  Cont lotion  Hygiene  LOS (Days) 4 A FACE TO FACE EVALUATION WAS PERFORMED  Ankit Lorie Phenix 10/11/2016 7:53 AM

## 2016-10-11 NOTE — Progress Notes (Signed)
Physical Therapy Session Note  Patient Details  Name: Kendry Ferrand MRN: [id number] Date of Birth: 31-Dec-1940  Today's Date: 10/11/2016 PT Individual Time: 1500-1600 PT Individual Time Calculation (min): 60 min    Short Term Goals: Week 1:  PT Short Term Goal 1 (Week 1): STG = LTG Due to short ELOS  Skilled Therapeutic Interventions/Progress Updates:  Pt received in bed with wife present.  Pt reporting tolerable pain and premedicated.  Performed supine > sit with supervision and performed reaching to floor to position shoes and pt able to don shoes in standing with RW and supervision-min A. Pt performed gait x 150' x 2 with RW and supervision with improved knee flexion, heel strike and step through gait sequence.  In gym pt performed ROM/stretching of bilat gastroc, hamstrings and hip flexors in standing on wedge while performing UE full flexion for more upright trunk and hip extension ROM.  In sitting performed towel assisted knee flexion heel slides with anterior scooting with foot stationary.  Performed sit <> stand training from elevated mat with focus on increased knee flexion to bring feet under COG and perform sit <> stand bring COG over BOS (instead of walking feet under COG) and extension through knees and hips to come to full stand with min-mod A.  Also focus on increasing knee flexion during stand > sit.  Performed stair negotiation training up/down 12 stairs (8 3 inch and 4 6 inch) with bilat UE support with focus on increased hip and knee flexion to advance each LE and increasing knee flexion during forwards descent.  Returned to room and pt transferred back into bed with supervision and ice packs placed on bilat knees.    Therapy Documentation Precautions:  Precautions Precautions: Fall, Knee Required Braces or Orthoses: Knee Immobilizer - Right, Knee Immobilizer - Left Knee Immobilizer - Right: Discontinue once straight leg raise with < 10 degree lag Knee Immobilizer - Left:  Discontinue once straight leg raise with < 10 degree lag Restrictions Weight Bearing Restrictions: Yes RLE Weight Bearing: Weight bearing as tolerated LLE Weight Bearing: Weight bearing as tolerated Vital Signs: Therapy Vitals Temp: 99 F (37.2 C) Temp Source: Oral Pulse Rate: (!) 110 Resp: 16 BP: 136/71 Patient Position (if appropriate): Lying Oxygen Therapy SpO2: 98 % O2 Device: Not Delivered Pain: Pain Assessment Pain Score: 5   See Function Navigator for Current Functional Status.   Therapy/Group: Individual Therapy  Raylene Everts [facility] 10/11/2016, 4:15 PM

## 2016-10-11 NOTE — Progress Notes (Signed)
Physical Therapy Session Note  Patient Details  Name: Stephen Nixon MRN: [id number] Date of Birth: [birth year]  Today's Date: 10/11/2016 PT Individual Time: 8403-7543 AND 1300-1330  PT Individual Time Calculation (min): 43 min AND 30 min    Short Term Goals: Week 1:  PT Short Term Goal 1 (Week 1): STG = LTG Due to short ELOS  Skilled Therapeutic Interventions/Progress Updates:      Session 1:  Patient received sitting up in bed and agreeable to PT.   Patient instructed is supine>sit with supervision Assist with min cues for proper LE management. Patient performed sit<>stand to don underpants with supervision Assist and able to pull pants to waist with supervision Assist. Gait to and from room with supervision Assist from PT mod cues for improved knee flexion in swing to allow full foot clearance.   Nustep level 3 x 10 minutes with emphasis on increased knee extension/flexion. knee flexion up to 71 degrees bilaterally on this day.  Bed mobility for sit>Supine with supervision assist from PT with min cues for LE management and safety; moderate use of bed rails to stabilize trunk. .  Sit<>stand with supervision Assist and RW x 5 throughout treatment. Min cues for increased knee flexion to allow improved anterior weight shift as tolerated.   Session 2:  Patient received supine in bed and agreeable to PT.  PT instructed patient in supine HEP for improved activity tolerance/strengthening SLR x 10 BLE, heel slides within AROM,  Hip abduciton x 12 BLE  Ankle PF with level 2 tband  Bridges with progressively increased knee flexion x5; PT stabilized BLE to prevent loss of knee fleixon.  Throughout treatment; PT provided min-mod cues for improved LE positioning, increased terminal knee extension as tolerated, and proper speed of movement to improve strengthening aspect of movement.   Patient left sitting up in bed with call bell in reach and all needs met.   Therapy  Documentation Precautions:  Precautions Precautions: Fall, Knee Required Braces or Orthoses: Knee Immobilizer - Right, Knee Immobilizer - Left Knee Immobilizer - Right: Discontinue once straight leg raise with < 10 degree lag Knee Immobilizer - Left: Discontinue once straight leg raise with < 10 degree lag Restrictions Weight Bearing Restrictions: Yes RLE Weight Bearing: Weight bearing as tolerated LLE Weight Bearing: Weight bearing as tolerated General:   Vital Signs: Therapy Vitals Temp: 99 F (37.2 C) Temp Source: Oral Pulse Rate: (!) 110 Resp: 16 BP: 136/71 Patient Position (if appropriate): Lying Oxygen Therapy SpO2: 98 % O2 Device: Not Delivered Pain:   4/10 ( aching) R knee.    See Function Navigator for Current Functional Status.   Therapy/Group: Individual Therapy  Lorie Phenix 10/11/2016, 5:43 PM

## 2016-10-11 NOTE — Progress Notes (Signed)
ANTICOAGULATION CONSULT NOTE - Follow Up Consult  Pharmacy Consult for coumadin Indication: VTE prophylaxis  Allergies  Allergen Reactions  . Tylenol [Acetaminophen] Rash    Patient Measurements: Height: 5\' 10"  (177.8 cm) Weight: 175 lb 11.2 oz (79.7 kg) (pt stated 160lbs) IBW/kg (Calculated) : 73 Heparin Dosing Weight:   Vital Signs: Temp: 98.9 F (37.2 C) (11/01 0531) Temp Source: Oral (11/01 0531) BP: 128/76 (11/01 0531) Pulse Rate: 104 (11/01 0531)  Labs:  Recent Labs  10/09/16 0703 10/10/16 0529 10/11/16 0559  HGB  --  9.2*  --   HCT  --  27.0*  --   PLT  --  406*  --   LABPROT 25.5* 28.9* 26.6*  INR 2.27 2.66 2.40  CREATININE 0.76 0.76  --     Estimated Creatinine Clearance: 84.9 mL/min (by C-G formula based on SCr of 0.76 mg/dL).   Medications:  Scheduled:  . camphor-menthol   Topical TID AC  . iron polysaccharides  150 mg Oral Daily  . polyethylene glycol  17 g Oral BID  . senna-docusate  1 tablet Oral BID  . warfarin  2.5 mg Oral q1800  . Warfarin - Pharmacist Dosing Inpatient   Does not apply q1800   Infusions:    Assessment: 75 yo male on therapeutic coumadin for VTE prophylaxis. INR today is 2.4.  Goal of Therapy:  INR 2-3 Monitor platelets by anticoagulation protocol: Yes   Plan:  - coumadin 2.5 mg po daily - change INR to MWF  Goldie Tregoning, Tsz-Yin 10/11/2016,7:27 AM

## 2016-10-12 ENCOUNTER — Inpatient Hospital Stay (HOSPITAL_COMMUNITY): Payer: Medicare Other | Admitting: Physical Therapy

## 2016-10-12 ENCOUNTER — Inpatient Hospital Stay (HOSPITAL_COMMUNITY): Payer: Medicare Other | Admitting: *Deleted

## 2016-10-12 ENCOUNTER — Inpatient Hospital Stay (HOSPITAL_COMMUNITY): Payer: Medicare Other

## 2016-10-12 ENCOUNTER — Inpatient Hospital Stay (HOSPITAL_COMMUNITY): Payer: Medicare Other | Admitting: Occupational Therapy

## 2016-10-12 NOTE — Progress Notes (Signed)
Social Work Patient ID: Stephen Nixon, male   DOB: 1941/03/20, 75 y.o.   MRN: [id number]    Have reviewed team conference with pt who is aware and agreeable with targeted d/c date of 11/7 and mod ind goals.  No concerns and pleased with progress.  Manasseh Pittsley, LCSW

## 2016-10-12 NOTE — Progress Notes (Signed)
[place] PHYSICAL MEDICINE & REHABILITATION     PROGRESS NOTE    Subjective/Complaints: Pt laying in bed this AM.  He states he is cold and requests to increase the temp in his room. He also notes he had a BM this AM.  Slept well overnight.  ROS:  Denies CP, SOB, N/V/D  Objective: Vital Signs: Blood pressure 128/68, pulse (!) 105, temperature 98.9 F (37.2 C), temperature source Oral, resp. rate 16, height 5\' 10"  (1.778 m), weight 74.4 kg (164 lb), SpO2 98 %. No results found.  Recent Labs  10/10/16 0529  WBC 12.4*  HGB 9.2*  HCT 27.0*  PLT 406*    Recent Labs  10/10/16 0529  NA 137  K 3.7  CL 99*  GLUCOSE 103*  BUN 13  CREATININE 0.76  CALCIUM 8.2*   CBG (last 3)  No results for input(s): GLUCAP in the last 72 hours.  Wt Readings from Last 3 Encounters:  10/11/16 74.4 kg (164 lb)  10/04/16 73.8 kg (162 lb 12 oz)  09/27/16 73.8 kg (162 lb 12.8 oz)    Physical Exam:  Constitutional: He appears well-developed and well-nourished. No distress. Vital signs reviewed.  HENT: Normocephalic and atraumatic.  Eyes: EOMI.  No discharge.  Cardiovascular: +Tachycardia. Regular rhythm.  No JVD. Respiratory: Effort normal and breath sounds normal. No stridor. No respiratory distress. He has no wheezes or rales.  GI: Soft. Bowel sounds are normal. He exhibits no distension. There is no tenderness.  Musculoskeletal: He exhibits edema and mild tenderness.  Neurological: He is alert and oriented. No cranial nerve deficit. Coordination normal.  B/l UE 5/5.  LE: Hip flexion 4/5, knee extension 3/5, 4+/5 ADF/PF  Skin: Skin is warm and dry. He is not diaphoretic. Slight erythema surrounding knee incisions previously, dressed today c/d/i Psychiatric: He has a normal mood and affect. His behavior is normal. Thought content normal.   Assessment/Plan: 1. Functional and mobility deficits secondary to bilateral endstage OA/bilateral TKA's which require 3+ hours per day of  interdisciplinary therapy in a comprehensive inpatient rehab setting. Physiatrist is providing close team supervision and 24 hour management of active medical problems listed below. Physiatrist and rehab team continue to assess barriers to discharge/monitor patient progress toward functional and medical goals.  Function:  Bathing Bathing position   Position: Shower  Bathing parts Body parts bathed by patient: Right arm, Left arm, Chest, Abdomen, Front perineal area, Buttocks, Right upper leg, Left upper leg, Right lower leg, Left lower leg, Back Body parts bathed by helper: Back  Bathing assist Assist Level: Assistive device, Supervision or verbal cues Assistive Device Comment: LH sponge    Upper Body Dressing/Undressing Upper body dressing   What is the patient wearing?: Pull over shirt/dress     Pull over shirt/dress - Perfomed by patient: Thread/unthread right sleeve, Thread/unthread left sleeve, Put head through opening          Upper body assist Assist Level: More than reasonable time   Set up : To obtain clothing/put away  Lower Body Dressing/Undressing Lower body dressing   What is the patient wearing?: Pants, Ted Hose, Shoes     Pants- Performed by patient: Thread/unthread right pants leg, Thread/unthread left pants leg, Pull pants up/down Pants- Performed by helper: Thread/unthread right pants leg, Thread/unthread left pants leg, Pull pants up/down   Non-skid slipper socks- Performed by helper: Don/doff right sock, Don/doff left sock       Shoes - Performed by helper: Don/doff right shoe, Don/doff left  shoe, Fasten right, Fasten left       TED Hose - Performed by helper: Don/doff right TED hose, Don/doff left TED hose  Lower body assist Assist for lower body dressing: Touching or steadying assistance (Pt > 75%)      Toileting Toileting   Toileting steps completed by patient: Adjust clothing prior to toileting, Performs perineal hygiene, Adjust clothing after  toileting Toileting steps completed by helper: Adjust clothing prior to toileting, Performs perineal hygiene, Adjust clothing after toileting Toileting Assistive Devices: Grab bar or rail  Toileting assist Assist level: Touching or steadying assistance (Pt.75%)   Transfers Chair/bed transfer   Chair/bed transfer method: Ambulatory Chair/bed transfer assist level: Supervision or verbal cues Chair/bed transfer assistive device: Environmental consultant, Air cabin crew     Max distance: 150 Assist level: Supervision or verbal cues   Wheelchair   Type: Manual Max wheelchair distance: 150 ft Assist Level: Supervision or verbal cues  Cognition Comprehension Comprehension assist level: Follows complex conversation/direction with no assist  Expression Expression assist level: Expresses complex ideas: With no assist  Social Interaction Social Interaction assist level: Interacts appropriately with others with medication or extra time (anti-anxiety, antidepressant).  Problem Solving Problem solving assist level: Solves complex problems: Recognizes & self-corrects  Memory Memory assist level: Complete Independence: No helper   Medical Problem List and Plan: 1.  Functional and mobility deficits secondary to endstage OA of bilateral knees s/p bilateral TKA's.  Cont CIR 2.  DVT Prophylaxis/Anticoagulation: Pharmaceutical: Coumadin   INR therapeutic on 11/2 3. Pain Management: Encouraged to use robaxin for muscle spasms.   Oxycodone prn, weaned 10/30 4. Mood: LCSW to follow for evaluation  And support.  5. Neuropsych: This patient is capable of making decisions on his own behalf. 6. Skin/Wound Care:  Routine pressure relief measures. Monitor wound for healing.  7. Fluids/Electrolytes/Nutrition: Monitor I/O. still 8. ABLA: On iron bid.   Hb 9.2 on 10/31  Cont to monitor  9. Leukocytosis:   WBCs 12.4 on 10/31 (improving)  UTI (UA equivocal), Ucx with insignificant growth  CXR 10/30  reviewed, unremarkable for acute process  Empiric keflex started, d/ced 10/30  Blood cultures NGTD   Respiratory panel negative 10 Nausea: Resolved  advanced to regular diet 11. Fevers  Likely reactive  Afebrile x48 hours. 12. Hypokalemia  K+ 3.7 on 10/31  Supplemented 13. Opoid induced Constipation  Bowel reg increased on 10/30  Improving 14. Heat rash  On upper back  Cont lotion  Hygiene  LOS (Days) 5 A FACE TO FACE EVALUATION WAS PERFORMED  Randie Tallarico Lorie Phenix 10/12/2016 8:15 AM

## 2016-10-12 NOTE — Progress Notes (Signed)
Physical Therapy Session Note  Patient Details  Name: Mizraim Ganson MRN: [id number] Date of Birth: 1941/06/27  Today's Date: 10/12/2016 PT Individual Time: [id number] PT Individual Time Calculation (min): 70 min    Short Term Goals: Week 1:  PT Short Term Goal 1 (Week 1): STG = LTG Due to short ELOS  Skilled Therapeutic Interventions/Progress Updates:    Session focused on functional transfers (with focus on increasing knee flexion for more normal transitional movement patterns) with RW, gait training with RW on unit at overall supervision level with cues for normalizing gait pattern and decreasing compensatory strategies due to limited knee ROM, supine and seated LE therex to address ROM and strengthening (limited tolerance in supine due to mild nausea laying down after lunch), sit <> stands from lower surface with focus on increasing knee flexion and decreasing reliance on UE's x 5 reps, Nustep for ROM and strengthening on level 4 x 5:30 min, and stair negotiation for preparation for home access with cues not to circumduct at hip for foot clearance. In supine and seated position completed AAROM knee flexion/extension (seated and supine), hip abduction (supine), and quad sets (supine) x 10-15 reps each BLE. Simulated car transfer training, ramp negotiation with RW, and gait over mulched surface with RW to focus on community mobility and preparation for discharge. Pt cued throughout session for upright posture and decreased reliance on UE's while using RW. Educated on home modifications to furniture to build up low sofa/recliner to make more accessible for transfers until ROM increased - pt plans to have his wife measure height for ability to practice here.     Therapy Documentation Precautions:  Precautions Precautions: Fall, Knee Required Braces or Orthoses: Knee Immobilizer - Right, Knee Immobilizer - Left Knee Immobilizer - Right: Discontinue once straight leg raise with < 10 degree  lag Knee Immobilizer - Left: Discontinue once straight leg raise with < 10 degree lag Restrictions Weight Bearing Restrictions: Yes RLE Weight Bearing: Weight bearing as tolerated LLE Weight Bearing: Weight bearing as tolerated   Pain: Premedicated for B knee soreness - ice given at end of session.   See Function Navigator for Current Functional Status.   Therapy/Group: Individual Therapy  Canary Brim Ivory Broad, PT, DPT  10/12/2016, 3:19 PM

## 2016-10-12 NOTE — Progress Notes (Signed)
Physical Therapy Session Note  Patient Details  Name: Stephen Nixon MRN: [id number] Date of Birth: 1941/02/12  Today's Date: 10/12/2016 PT Individual Time: 0905-1000 PT Individual Time Calculation (min): 55 min    Short Term Goals: Week 1:  PT Short Term Goal 1 (Week 1): STG = LTG Due to short ELOS  Skilled Therapeutic Interventions/Progress Updates:      Patient received supine EOB and agreeable to PT. Supine>sit with supervision Assist from PT with mild use of bed rails and min cues for safety with elevated bed height. Gait training to rehab gymx 142ft with RW and supervision Assist from PT. Moderate cues for improved terminal knee extension in stance in increased knee/hip flexion in swing phase. Patient noted to have improved knee flexion in swing, but little to no change in knee extension following instruciton.   Standing therex.  Mini squat x 10, hip abduction x 10 BLE. Min cues to prevent compensation form trunk.   Sitting therex Heel slides, LAQ, reciprocal marches. All seated therex completed 1x12 BLE with min cues for improved terminal knee extension   Supine therex: SAQ with 3 second hold, reciprocal marches, mini bridge, hip abduction. All superine therex completed 1 x10 with mod cues for proper speed and ROM  Manual therapy. With gentle reciprocal distraction to BLE 3 bouts x 30 seconds. PROM to end range flexion/extension with gradual pain free increase in over pressure; 10 bouts with 5 second hold. HS strength 2 bouts of 30 seconds each.   Gait back to room x 24ft with RW and Supervision Assist from PT with min cues for improved heel contact bilaterally to allow increased terminal knee extension. .  Throughout treatment patient performed sit<>stand x 8 with mod cues for improved increase in   Patient performed sit>supine with supervision Assist form PT and min cues for proper use of bed rails. Patient left sitting in WC with call bell in reach.   Therapy  Documentation Precautions:  Precautions Precautions: Fall, Knee Required Braces or Orthoses: Knee Immobilizer - Right, Knee Immobilizer - Left Knee Immobilizer - Right: Discontinue once straight leg raise with < 10 degree lag Knee Immobilizer - Left: Discontinue once straight leg raise with < 10 degree lag Restrictions Weight Bearing Restrictions: Yes RLE Weight Bearing: Weight bearing as tolerated LLE Weight Bearing: Weight bearing as tolerated General:   Vital Signs: Therapy Vitals Temp: 100.2 F (37.9 C) Temp Source: Oral Pulse Rate: 99 Resp: 18 BP: 126/69 Patient Position (if appropriate): Sitting Oxygen Therapy SpO2: 99 % O2 Device: Not Delivered Pain: Pain Assessment Pain Assessment: 0-10 Pain Score: 0-No pain   See Function Navigator for Current Functional Status.   Therapy/Group: Individual Therapy  Lorie Phenix 10/12/2016, 5:29 PM

## 2016-10-12 NOTE — Patient Care Conference (Signed)
Inpatient RehabilitationTeam Conference and Plan of Care Update Date: 10/11/2016   Time: 2:45 PM    Patient Name: Stephen Nixon      Medical Record Number: [id number]  Date of Birth: 04-01-1941 Sex: Male         Room/Bed: 4M08C/4M08C-01 Payor Info: Payor: MEDICARE / Plan: MEDICARE PART A AND B / Product Type: *No Product type* /    Admitting Diagnosis: B TKR   Admit Date/Time:  10/07/2016  1:29 PM Admission Comments: No comment available   Primary Diagnosis:  S/P TKR (total knee replacement), bilateral Principal Problem: S/P TKR (total knee replacement), bilateral  Patient Active Problem List   Diagnosis Date Noted  . Heat rash   . Fever   . Post-operative pain   . Acute blood loss anemia   . Hypokalemia   . Constipation due to pain medication   . SIRS (systemic inflammatory response syndrome) (HCC)   . S/P TKR (total knee replacement), bilateral 10/07/2016  . Postoperative anemia due to acute blood loss 10/07/2016  . Leukocytosis 10/07/2016  . Primary osteoarthritis of knees, bilateral 10/07/2016  . OA (osteoarthritis) of knee 10/04/2016  . Primary osteoarthritis of both knees 01/14/2016    Expected Discharge Date: Expected Discharge Date: 10/17/16  Team Members Present: Physician leading conference: Dr. Delice Lesch Social Worker Present: [name], LCSW Nurse Present: Heather Roberts, RN PT Present: Raylene Everts, PT OT Present: Benay Pillow, OT SLP Present: Weldon Inches, SLP PPS Coordinator present : Daiva Nakayama, RN, CRRN     Current Status/Progress Goal Weekly Team Focus  Medical   Functional and mobility deficits secondary to endstage OA of bilateral knees s/p bilateral TKA's.  Improve mobility, Knee ROM, BMs  See above   Bowel/Bladder   continent of bowel and bladder  remain continent of bowel and bladder with min assist  educate patient about s/s of constipation   Swallow/Nutrition/ Hydration             ADL's   min A overall  mod I overall  pt edu,  strenthening, balance, AE training with self care   Mobility   Min A  Mod I  LE ROM, edema management, strengthening, balance, gait   Communication             Safety/Cognition/ Behavioral Observations            Pain   complains of pain to bilateral knees of 6-7/10  pain less than or equal to 4/10   assess for pain q4h and prn, premedicate for therapy and CPM   Skin   rash to back, buttocks and lower abdomen, treated with Sarna cream for itching  reduction of rash and itching with min assist   assess skin q shift and pern, apply anti-itch lotion as needed.    Rehab Goals Patient on target to meet rehab goals: Yes *See Care Plan and progress notes for long and short-term goals.  Barriers to Discharge: Fevers, leukocytosis, mobility, safety,     Possible Resolutions to Barriers:  Therapies, infectious workup, follow labs    Discharge Planning/Teaching Needs:  Home with wife who can provide 24/7 assistance.      Team Discussion:  Afebrile x 24 hrs.  Good gains with ROM.  Attempting stairs.  Working with AE and on track for mod ind goals.  Revisions to Treatment Plan:  None   Continued Need for Acute Rehabilitation Level of Care: The patient requires daily medical management by a physician with specialized training in  physical medicine and rehabilitation for the following conditions: Daily direction of a multidisciplinary physical rehabilitation program to ensure safe treatment while eliciting the highest outcome that is of practical value to the patient.: Yes Daily medical management of patient stability for increased activity during participation in an intensive rehabilitation regime.: Yes Daily analysis of laboratory values and/or radiology reports with any subsequent need for medication adjustment of medical intervention for : Post surgical problems;Other  [name], [place] 10/12/2016, 4:18 PM

## 2016-10-12 NOTE — Evaluation (Signed)
Recreational Therapy Assessment and Plan  Patient Details  Name: Stephen Nixon MRN: [id number] Date of Birth: [birth year] Today's Date: 10/12/2016  Rehab Potential:  Good  ELOS:   discharge 11/4  Assessment Clinical Impression:  Problem List: Patient Active Problem List   Diagnosis Date Noted  . S/P TKR (total knee replacement), bilateral 10/07/2016  . Postoperative anemia due to acute blood loss 10/07/2016  . Leukocytosis 10/07/2016  . Primary osteoarthritis of knees, bilateral 10/07/2016  . OA (osteoarthritis) of knee 10/04/2016  . Primary osteoarthritis of both knees 01/14/2016    Past Medical History:      Past Medical History:  Diagnosis Date  . Arthritis   . Closed fracture of left elbow with routine healing   . 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   . Impingement syndrome of left shoulder   . Measles   . Mumps    Past Surgical History:       Past Surgical History:  Procedure Laterality Date  . 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 TONSILLECTOMY    . TOTAL KNEE ARTHROPLASTY Bilateral 10/04/2016   Procedure: BILATERAL TOTAL KNEE ARTHROPLASTY;  Surgeon: Gaynelle Arabian, MD;  Location: [facility];  Service: Orthopedics;  Laterality: Bilateral;    Assessment & Plan Clinical Impression: Patient is a 75 y.o. year old male with bilateral knee OA with pain and with failure of conservative therapy. He elected to undergo B-TKR on 10/04/16 by Dr. Wynelle Link. Post op WBAT and epidural removed on 10/27. On coumadin/lovenox bridge for DVT prophylaxis. Therapy ongoing and patient limited by pain and fatigue. Noted to have downward trend in H/H as well as leucocytosis. CIR recommended for follow up therapy.  Patient transferred to CIR on 10/07/2016 .   Met with pt to discuss TR services, use of leisure time & importance of staying active.  Pt reports being very active PTA and desire to be more active after recovery period.  No further TR as  pt is discharging home 11/4.  Leisure History/Participation Premorbid leisure interest/current participation: Medical laboratory scientific officer - Travel (Comment);Sports - Exercise (Comment) Leisure Participation Style: Alone;With Family/Friends Psychosocial / Spiritual Social interaction - Mood/Behavior: Cooperative Academic librarian Appropriate for Education?: Yes Recreational Therapy Orientation Orientation -Reviewed with patient: Available activity resources Strengths/Weaknesses Patient Strengths/Abilities: Active premorbidly Patient weaknesses: Physical limitations TR Patient demonstrates impairments in the following area(s): Endurance;Pain;Safety  Plan No further TR  Recommendations for other services: None  Discharge Criteria: Patient will be discharged from TR if patient refuses treatment 3 consecutive times without medical reason.  If treatment goals not met, if there is a change in medical status, if patient makes no progress towards goals or if patient is discharged from hospital.  The above assessment, treatment plan, treatment alternatives and goals were discussed and mutually agreed upon: by patient  [place] 10/12/2016, 10:26 AM

## 2016-10-12 NOTE — Progress Notes (Signed)
Occupational Therapy Session Note  Patient Details  Name: Marlow Guard MRN: [id number] Date of Birth: March 05, 1941  Today's Date: 10/12/2016 OT Individual Time: 0700-0800 OT Individual Time Calculation (min): 60 min     Short Term Goals: Week 1:  OT Short Term Goal 1 (Week 1): STG = LTG d/t short anticipated LOS.  Skilled Therapeutic Interventions/Progress Updates:    Upon entering the room, pt seated on EOB awaiting therapist with supervision for bed mobility. Pt required verbal cues for hand placement and technique for sit <>stand. Pt standing with min A and ambulating to bathroom with supervision for bathing at shower level. Pt seated on shower chair and performs lateral leans and use of LH sponge in order to increase independence with self care. Pt returned to sit in wheelchair at sink for grooming and dressing tasks. Pt returning to bed at end of session for therapist to wrap B LEs with ACE and don TEDs. Call bell and all needed items within reach.   Therapy Documentation Precautions:  Precautions Precautions: Fall, Knee Required Braces or Orthoses: Knee Immobilizer - Right, Knee Immobilizer - Left Knee Immobilizer - Right: Discontinue once straight leg raise with < 10 degree lag Knee Immobilizer - Left: Discontinue once straight leg raise with < 10 degree lag Restrictions Weight Bearing Restrictions: Yes RLE Weight Bearing: Weight bearing as tolerated LLE Weight Bearing: Weight bearing as tolerated ADL: ADL ADL Comments: see Functional Assessment Tool Exercises:   Other Treatments:    See Function Navigator for Current Functional Status.   Therapy/Group: Individual Therapy  Phineas Semen 10/12/2016, 12:43 PM

## 2016-10-13 ENCOUNTER — Inpatient Hospital Stay (HOSPITAL_COMMUNITY): Payer: Medicare Other | Admitting: Physical Therapy

## 2016-10-13 ENCOUNTER — Inpatient Hospital Stay (HOSPITAL_COMMUNITY): Payer: Medicare Other | Admitting: Occupational Therapy

## 2016-10-13 DIAGNOSIS — R21 Rash and other nonspecific skin eruption: Secondary | ICD-10-CM | POA: Insufficient documentation

## 2016-10-13 LAB — PROTIME-INR
INR: 3.59
Prothrombin Time: 36.7 seconds — ABNORMAL HIGH (ref 11.4–15.2)

## 2016-10-13 MED ORDER — DIPHENHYDRAMINE HCL 12.5 MG/5ML PO ELIX
12.5000 mg | ORAL_SOLUTION | Freq: Three times a day (TID) | ORAL | Status: AC
  Administered 2016-10-13 – 2016-10-15 (×6): 12.5 mg via ORAL
  Filled 2016-10-13 (×6): qty 10

## 2016-10-13 MED ORDER — TRIAMCINOLONE ACETONIDE 0.1 % EX LOTN
TOPICAL_LOTION | Freq: Three times a day (TID) | CUTANEOUS | Status: DC
  Administered 2016-10-13 – 2016-10-15 (×5): via TOPICAL
  Administered 2016-10-15: 1 via TOPICAL
  Administered 2016-10-16 – 2016-10-17 (×4): via TOPICAL
  Filled 2016-10-13: qty 60

## 2016-10-13 MED ORDER — MORPHINE SULFATE 15 MG PO TABS
15.0000 mg | ORAL_TABLET | Freq: Four times a day (QID) | ORAL | Status: DC | PRN
  Administered 2016-10-13 – 2016-10-17 (×13): 15 mg via ORAL
  Filled 2016-10-13 (×13): qty 1

## 2016-10-13 MED ORDER — WARFARIN SODIUM 2 MG PO TABS
1.0000 mg | ORAL_TABLET | Freq: Once | ORAL | Status: AC
  Administered 2016-10-13: 1 mg via ORAL
  Filled 2016-10-13: qty 0.5

## 2016-10-13 NOTE — Progress Notes (Signed)
Social Work Patient ID: Stephen Nixon, male   DOB: 05-Oct-1941, 75 y.o.   MRN: [id number]  Met with pt and wife to discus family education and discharge needs. Wife will be here Monday @ 1:00pm for education. She ha gotten all of the needed equipment. Both prefer OP therapies. Will make referral.

## 2016-10-13 NOTE — Progress Notes (Signed)
ANTICOAGULATION CONSULT NOTE - Follow Up Consult  Pharmacy Consult for coumadin Indication: VTE prophylaxis  Allergies  Allergen Reactions  . Tylenol [Acetaminophen] Rash    Patient Measurements: Height: 5\' 10"  (177.8 cm) Weight: 164 lb (74.4 kg) IBW/kg (Calculated) : 73 Heparin Dosing Weight:   Vital Signs: Temp: 99 F (37.2 C) (11/03 0539) Temp Source: Oral (11/03 0539) BP: 128/74 (11/03 0539) Pulse Rate: 91 (11/03 0539)  Labs:  Recent Labs  10/11/16 0559 10/13/16 0521  LABPROT 26.6* 36.7*  INR 2.40 3.59    Estimated Creatinine Clearance: 84.9 mL/min (by C-G formula based on SCr of 0.76 mg/dL).   Medications:  Scheduled:  . camphor-menthol   Topical TID AC  . iron polysaccharides  150 mg Oral Daily  . polyethylene glycol  17 g Oral TID  . senna-docusate  1 tablet Oral BID  . warfarin  2.5 mg Oral q1800  . Warfarin - Pharmacist Dosing Inpatient   Does not apply q1800   Infusions:    Assessment: 75 yo male is on supratherapeutic coumadin for VTE prophylaxis.  INR today up to 3.59.  Previously was therapeutic at 2.5 mg po daily.  Goal of Therapy:  INR 2-3 Monitor platelets by anticoagulation protocol: Yes   Plan:  - d/c standing coumadin order. Coumadin 1 mg po x1 - change INR back to daily for now - Monitor CBC, s/sx of bleeding   Jayliah Benett, [place] 10/13/2016,7:49 AM

## 2016-10-13 NOTE — Progress Notes (Signed)
Physical Therapy Session Note  Patient Details  Name: Stephen Nixon MRN: [id number] Date of Birth: 07-Jan-1941  Today's Date: 10/13/2016 PT Individual Time: 0901-0958 PT Individual Time Calculation (min): 57 min    Short Term Goals: Week 1:  PT Short Term Goal 1 (Week 1): STG = LTG Due to short ELOS  Skilled Therapeutic Interventions/Progress Updates:     Received sitting in WC and agreeable PT. PT instructed patient in gait training with RW and supervision Assist to rehab gym x 232f. Additional gait training performed throughout treatment session with RWx [id number] PT provided min-mod cues throughout gait training for improved terminal knee extension with improved heel contact as well as increased hip and knee flexion to allow full foot clearance.   PT instructed patient in stair management on 3inch steps x 8. BUE support on rails with self selected step over step ascent and cues from PT for step to gait with descent due to increased pain in the RLE.   Standing therex instructed by PT for AROM knee flexion through partial lunge on 4 inch step with BUE support. Sit<>stand at standard seat height 2 bouts x 5 with supervision  Assist.  Nustep training for endurance/ROM x 9 minutes level 3>5 with min cues for improved knee extension and decreased compensation through the pelvis.  PT measured AROM knee flexion sitting EOB with max R LE flexion 76degrees and max LLE flexon 82 degrees.   Sit stand for 17 inch seat height to simulate transfer to toilet x 5 with min assist progressing to supervision Assist with min-mod cues for improved control to seat, increased knee flexion as tolerated and walking BLE out as needed to prevent pain.   [facility] returned to room and [facility].     Therapy Documentation Precautions:  Precautions Precautions: Fall, Knee Required Braces or Orthoses: Knee Immobilizer - Right, Knee Immobilizer - Left Knee Immobilizer -  Right: Discontinue once straight leg raise with < 10 degree lag Knee Immobilizer - Left: Discontinue once straight leg raise with < 10 degree lag Restrictions Weight Bearing Restrictions: Yes RLE Weight Bearing: Weight bearing as tolerated LLE Weight Bearing: Weight bearing as tolerated Pain:   3/10 BLE in knees; aching/sore  See Function Navigator for Current Functional Status.   Therapy/Group: Individual Therapy  AustLorie Phenix3/2017, 10:46 AM

## 2016-10-13 NOTE — Progress Notes (Signed)
Occupational Therapy Session Note  Patient Details  Name: Stephen Nixon MRN: [id number] Date of Birth: 1941-01-30  Today's Date: 10/13/2016 OT Individual Time: 1030-1100 OT Individual Time Calculation (min): 30 min     Short Term Goals: Week 1:  OT Short Term Goal 1 (Week 1): STG = LTG d/t short anticipated LOS.  Skilled Therapeutic Interventions/Progress Updates:    Treatment session with focus on functional mobility and activity tolerance.  Pt received upright in w/c with c/o pain and stiffness in RLE but willing to participate in treatment session.  Pt propelled w/c off unit down to hospital gift shop via accessing elevators all without assist.  Engaged in mobility in gift shop with RW to challenge balance and obstacle negotiation in narrow spaces. Close supervision with mobility in gift shop, distant supervision - Mod I in more spacious areas of lobby and food court area.  Returned to room via w/c and left with MD.  Therapy Documentation Precautions:  Precautions Precautions: Fall, Knee Required Braces or Orthoses: Knee Immobilizer - Right, Knee Immobilizer - Left Knee Immobilizer - Right: Discontinue once straight leg raise with < 10 degree lag Knee Immobilizer - Left: Discontinue once straight leg raise with < 10 degree lag Restrictions Weight Bearing Restrictions: Yes RLE Weight Bearing: Weight bearing as tolerated LLE Weight Bearing: Weight bearing as tolerated Pain: Pain Assessment Pain Assessment: 0-10 Pain Score: 7  Pain Location: Knee Pain Orientation: Right;Left Pain Descriptors / Indicators: Aching Pain Frequency: Constant Pain Onset: On-going Patients Stated Pain Goal: 4 Pain Intervention(s): Medication (See eMAR)  See Function Navigator for Current Functional Status.   Therapy/Group: Individual Therapy  Simonne Come 10/13/2016, 12:22 PM

## 2016-10-13 NOTE — Progress Notes (Signed)
Occupational Therapy Session Note  Patient Details  Name: Nakota Lichtenstein MRN: [id number] Date of Birth: [birth year]  Today's Date: 10/13/2016 OT Individual Time: [id number] and [id number] OT Individual Time Calculation (min): 47 min and 63 minutes  Short Term Goals: Week 1:  OT Short Term Goal 1 (Week 1): STG = LTG d/t short anticipated LOS.     Skilled Therapeutic Interventions/Progress Updates:   Pt was lying supine in bed and agreeable to shower in tub room this morning. Pt gathered ADL items and ambulated with RW down hallway with min guard. Shower transfer and bathing completed with Min A while standing as needed with use of grab bars. Dressing completed w/c level without AE and instruction on crossing ankles over lower leg to progress to crossing leg over knee. Tx focus on using BADL completion to increase LE ROM. AE wraps/TEDs donned with Total A. Oral care and toileting completed when back in room with pt exhibiting good walker safety. D/c planning completed with pt reporting wife already has shower bench and would rather get his muscles stronger instead of relying on [facility] for toileting. SW made aware. Pt was left up in w/c with all needs within reach at time of departure.    2nd Session 1:1 Tx (47 minutes) Pt participated in skilled OT session focusing on balance and walker safety during kitchen mobility. Pt was lying in bed at start of tx and had just finished lunch. Min c/o pain with pt agreeable to tx. Pt ambulated down hallway and was educated on walker safety in kitchen. Problem solving completed in regards to retrieving items/depositing items in dishwasher, using oven, and taking out trash (wife Romie Minus assisting with trash). Pt instructed on performing mini squats (for LE flexibility) at counter when washing dishes or wiping countertops, which pt completed during session. Pt took seated rest break in low couch and was able to power up with min guard. Pt then ambulated to gym and completed  dynamic standing task using tiles for balance challenges. Activity graded to complete task with unilateral support on walker. Pt ambulated back to room and was transferred to bed with ice packs on knees and all needs within reach.   Therapy Documentation Precautions:  Precautions Precautions: Fall, Knee Required Braces or Orthoses: Knee Immobilizer - Right, Knee Immobilizer - Left Knee Immobilizer - Right: Discontinue once straight leg raise with < 10 degree lag Knee Immobilizer - Left: Discontinue once straight leg raise with < 10 degree lag Restrictions Weight Bearing Restrictions: Yes RLE Weight Bearing: Weight bearing as tolerated LLE Weight Bearing: Weight bearing as tolerated General:   Vital Signs: Therapy Vitals Temp: 99.1 F (37.3 C) Temp Source: Oral Pulse Rate: 100 Resp: 18 BP: 121/70 Patient Position (if appropriate): Sitting Oxygen Therapy SpO2: 94 % O2 Device: Not Delivered Pain: No c/o pain during session    ADL: ADL ADL Comments: see Functional Assessment Tool :    See Function Navigator for Current Functional Status.   Therapy/Group: Individual Therapy  Doug Bucklin A Ayeden Gladman 10/13/2016, 4:02 PM

## 2016-10-13 NOTE — Progress Notes (Signed)
[facility] PHYSICAL MEDICINE & REHABILITATION     PROGRESS NOTE    Subjective/Complaints: Pt seen returning from therapies today.  He states he had some pain overnight and did not sleep well.  He also notes increase in rash.  He notes 85 deg on CPM.  ROS:  +Rash. Denies CP, SOB, N/V/D  Objective: Vital Signs: Blood pressure 128/74, pulse 91, temperature 99 F (37.2 C), temperature source Oral, resp. rate 16, height 5\' 10"  (1.778 m), weight 74.4 kg (164 lb), SpO2 98 %. No results found. No results for input(s): WBC, HGB, HCT, PLT in the last 72 hours. No results for input(s): NA, K, CL, GLUCOSE, BUN, CREATININE, CALCIUM in the last 72 hours.  Invalid input(s): CO CBG (last 3)  No results for input(s): GLUCAP in the last 72 hours.  Wt Readings from Last 3 Encounters:  10/11/16 74.4 kg (164 lb)  10/04/16 73.8 kg (162 lb 12 oz)  09/27/16 73.8 kg (162 lb 12.8 oz)    Physical Exam:  Constitutional: He appears well-developed and well-nourished. No distress. Vital signs reviewed.  HENT: Normocephalic and atraumatic.  Eyes: EOMI.  No discharge.  Cardiovascular: RRR. No JVD. Respiratory: Effort normal and breath sounds normal. No stridor. No respiratory distress. He has no wheezes or rales.  GI: Soft. Bowel sounds are normal. He exhibits no distension. There is no tenderness.  Musculoskeletal: He exhibits edema and mild tenderness.  Neurological: He is alert and oriented. No cranial nerve deficit. Coordination normal.  B/l UE 5/5.  LE: Hip flexion 4-4+/5, knee extension 4-/5, 4+/5 ADF/PF  Skin: Skin is warm and dry. He is not diaphoretic. Slight erythema surrounding knee incisions previously, dressed today c/d/i Psychiatric: He has a normal mood and affect. His behavior is normal. Thought content normal.   Assessment/Plan: 1. Functional and mobility deficits secondary to bilateral endstage OA/bilateral TKA's which require 3+ hours per day of interdisciplinary therapy in a  comprehensive inpatient rehab setting. Physiatrist is providing close team supervision and 24 hour management of active medical problems listed below. Physiatrist and rehab team continue to assess barriers to discharge/monitor patient progress toward functional and medical goals.  Function:  Bathing Bathing position   Position: Shower  Bathing parts Body parts bathed by patient: Right arm, Left arm, Chest, Abdomen, Front perineal area, Buttocks, Right upper leg, Left upper leg, Right lower leg, Left lower leg, Back Body parts bathed by helper: Back  Bathing assist Assist Level: Touching or steadying assistance(Pt > 75%) Assistive Device Comment: LH sponge    Upper Body Dressing/Undressing Upper body dressing   What is the patient wearing?: Pull over shirt/dress     Pull over shirt/dress - Perfomed by patient: Thread/unthread right sleeve, Thread/unthread left sleeve, Put head through opening          Upper body assist Assist Level: More than reasonable time   Set up : To obtain clothing/put away  Lower Body Dressing/Undressing Lower body dressing   What is the patient wearing?: Pants, Ted Hose, Non-skid slipper socks     Pants- Performed by patient: Thread/unthread right pants leg, Thread/unthread left pants leg, Pull pants up/down Pants- Performed by helper: Thread/unthread right pants leg, Thread/unthread left pants leg, Pull pants up/down   Non-skid slipper socks- Performed by helper: Don/doff right sock, Don/doff left sock       Shoes - Performed by helper: Don/doff right shoe, Don/doff left shoe, Fasten right, Fasten left       TED Hose - Performed by helper: Don/doff  right TED hose, Don/doff left TED hose  Lower body assist Assist for lower body dressing: Touching or steadying assistance (Pt > 75%)      Toileting Toileting   Toileting steps completed by patient: Adjust clothing prior to toileting, Performs perineal hygiene, Adjust clothing after  toileting Toileting steps completed by helper: Adjust clothing prior to toileting, Performs perineal hygiene, Adjust clothing after toileting Toileting Assistive Devices: Grab bar or rail  Toileting assist Assist level: Supervision or verbal cues   Transfers Chair/bed transfer   Chair/bed transfer method: Stand pivot Chair/bed transfer assist level: Touching or steadying assistance (Pt > 75%) Chair/bed transfer assistive device: Armrests     Locomotion Ambulation     Max distance: 25ft  Assist level: Supervision or verbal cues   Wheelchair   Type: Manual Max wheelchair distance: 150 ft Assist Level: Supervision or verbal cues  Cognition Comprehension Comprehension assist level: Follows complex conversation/direction with no assist  Expression Expression assist level: Expresses complex ideas: With no assist  Social Interaction Social Interaction assist level: Interacts appropriately with others with medication or extra time (anti-anxiety, antidepressant).  Problem Solving Problem solving assist level: Solves complex problems: Recognizes & self-corrects  Memory Memory assist level: Complete Independence: No helper   Medical Problem List and Plan: 1.  Functional and mobility deficits secondary to endstage OA of bilateral knees s/p bilateral TKA's.  Cont CIR 2.  DVT Prophylaxis/Anticoagulation: Pharmaceutical: Coumadin   INR Supratherapeutic on 11/2 3. Pain Management: Encouraged to use robaxin for muscle spasms.   Oxycodone prn, weaned 10/30  Changed to Morphine due to rash on 11/3 4. Mood: LCSW to follow for evaluation  And support.  5. Neuropsych: This patient is capable of making decisions on his own behalf. 6. Skin/Wound Care:  Routine pressure relief measures. Monitor wound for healing.  7. Fluids/Electrolytes/Nutrition: Monitor I/O. still 8. ABLA: On iron bid.   Hb 9.2 on 10/31  Cont to monitor   Labs ordered for Monday 9. Leukocytosis:   WBCs 12.4 on 10/31  (improving)  UTI (UA equivocal), Ucx with insignificant growth  CXR 10/30 reviewed, unremarkable for acute process  Empiric keflex started, d/ced 10/30  Blood cultures remain NGTD   Respiratory panel negative  Labs ordered for Monday 10 Nausea: Resolved  advanced to regular diet 11. Fevers  Likely reactive  Afebrile >72 hours. 12. Hypokalemia  K+ 3.7 on 10/31  Supplemented  Labs ordered for Monday 13. Opoid induced Constipation  Bowel reg increased on 10/30  Improving 14. Rash - likely drug induced  Creams ordered, medications changed  Will cont to monitor  LOS (Days) 6 A FACE TO FACE EVALUATION WAS PERFORMED  Elton Heid Lorie Phenix 10/13/2016 8:57 AM

## 2016-10-13 NOTE — Progress Notes (Signed)
Social Work Patient ID: Stephen Nixon, male   DOB: [birth year], 75 y.o.   MRN: [id number]   Met with pt to discuss equipment needs, pt reports he has walkers and also has a tub bench wife purchased. Pt wants to do OP therapy will need to see who will follow his coumadin while on for his knee surgery. Have let Pam-PA know pt's preference for OP.

## 2016-10-14 ENCOUNTER — Inpatient Hospital Stay (HOSPITAL_COMMUNITY): Payer: Medicare Other | Admitting: *Deleted

## 2016-10-14 ENCOUNTER — Inpatient Hospital Stay (HOSPITAL_COMMUNITY): Payer: Medicare Other | Admitting: Occupational Therapy

## 2016-10-14 LAB — URINALYSIS, ROUTINE W REFLEX MICROSCOPIC
GLUCOSE, UA: NEGATIVE mg/dL
Hgb urine dipstick: NEGATIVE
KETONES UR: NEGATIVE mg/dL
Nitrite: NEGATIVE
PH: 5.5 (ref 5.0–8.0)
Protein, ur: NEGATIVE mg/dL
SPECIFIC GRAVITY, URINE: 1.018 (ref 1.005–1.030)

## 2016-10-14 LAB — URINE MICROSCOPIC-ADD ON

## 2016-10-14 LAB — PROTIME-INR
INR: 3.59
Prothrombin Time: 36.7 seconds — ABNORMAL HIGH (ref 11.4–15.2)

## 2016-10-14 LAB — CBC
HEMATOCRIT: 28.4 % — AB (ref 39.0–52.0)
HEMOGLOBIN: 9.3 g/dL — AB (ref 13.0–17.0)
MCH: 30.2 pg (ref 26.0–34.0)
MCHC: 32.7 g/dL (ref 30.0–36.0)
MCV: 92.2 fL (ref 78.0–100.0)
Platelets: 814 10*3/uL — ABNORMAL HIGH (ref 150–400)
RBC: 3.08 MIL/uL — ABNORMAL LOW (ref 4.22–5.81)
RDW: 13.8 % (ref 11.5–15.5)
WBC: 11.9 10*3/uL — ABNORMAL HIGH (ref 4.0–10.5)

## 2016-10-14 MED ORDER — IBUPROFEN 400 MG PO TABS
400.0000 mg | ORAL_TABLET | Freq: Four times a day (QID) | ORAL | Status: DC | PRN
  Administered 2016-10-14 – 2016-10-15 (×2): 400 mg via ORAL
  Filled 2016-10-14 (×3): qty 1

## 2016-10-14 MED ORDER — WARFARIN SODIUM 2 MG PO TABS
1.0000 mg | ORAL_TABLET | Freq: Once | ORAL | Status: AC
  Administered 2016-10-14: 1 mg via ORAL
  Filled 2016-10-14: qty 0.5

## 2016-10-14 NOTE — Progress Notes (Signed)
ANTICOAGULATION CONSULT NOTE - Follow Up Consult  Pharmacy Consult for coumadin Indication: VTE prophylaxis  Allergies  Allergen Reactions  . Tylenol [Acetaminophen] Rash    Patient Measurements: Height: 5\' 10"  (177.8 cm) Weight: 164 lb (74.4 kg) IBW/kg (Calculated) : 73  Vital Signs: Temp: 99.2 F (37.3 C) (11/04 0611) Temp Source: Oral (11/04 0611) BP: 119/73 (11/04 0611) Pulse Rate: 101 (11/04 0611)  Labs:  Recent Labs  10/13/16 0521 10/14/16 0607  LABPROT 36.7* 36.7*  INR 3.59 3.59    Estimated Creatinine Clearance: 84.9 mL/min (by C-G formula based on SCr of 0.76 mg/dL).   Medications:  Scheduled:  . camphor-menthol   Topical TID AC  . diphenhydrAMINE  12.5 mg Oral TID AC  . iron polysaccharides  150 mg Oral Daily  . polyethylene glycol  17 g Oral TID  . senna-docusate  1 tablet Oral BID  . triamcinolone lotion   Topical TID  . Warfarin - Pharmacist Dosing Inpatient   Does not apply q1800   Infusions:    Assessment: 75 yo male is supratherapeutic on coumadin for VTE prophylaxis. INR up to 3.59 x2 days. Previously was therapeutic at 2.5 mg daily. No overt signs or symptoms of bleeding noted.   Goal of Therapy:  INR 2-3 Monitor platelets by anticoagulation protocol: Yes   Plan:  - Coumadin 1 mg po x1 - Resumed daily INR for now - Monitor CBC, s/sx of bleeding - F/u labs Monday   Argie Ramming, PharmD Pharmacy Resident  Pager [phone] 10/14/16 10:51 AM

## 2016-10-14 NOTE — Progress Notes (Signed)
Occupational Therapy Session Note  Patient Details  Name: Stephen Nixon MRN: [id number] Date of Birth: January 25, 1941  Today's Date: 10/14/2016 OT Individual Time: [id number] OT Individual Time Calculation (min): 31 min    Short Term Goals: Week 1:  OT Short Term Goal 1 (Week 1): STG = LTG d/t short anticipated LOS.  Skilled Therapeutic Interventions/Progress Updates:   Pt was received at EOB with PT handoff. Pt was agreeable to participate in tx involving high level balance demands. Pt ambulated to gym with supervision and instruction for staying inside of walker/upright gaze. Graded Biodex activities completed with pt exhibiting difficultly with anterior weight shifts and increased reliance on UEs instead of core strength. Pt was educated on this with verbalized understanding. Afterwards pt self propelled back to room and was transferred to bed. Left with all needs within reach.    Therapy Documentation Precautions:  Precautions Precautions: Fall, Knee Required Braces or Orthoses: Knee Immobilizer - Right, Knee Immobilizer - Left Knee Immobilizer - Right: Discontinue once straight leg raise with < 10 degree lag Knee Immobilizer - Left: Discontinue once straight leg raise with < 10 degree lag Restrictions Weight Bearing Restrictions: Yes RLE Weight Bearing: Weight bearing as tolerated LLE Weight Bearing: Weight bearing as tolerated   Vital Signs: Therapy Vitals Temp: (!) 101 F (38.3 C) (reported Surveyor, mining) Temp Source: Oral Pulse Rate: (!) 110 Resp: 18 BP: 107/69 Patient Position (if appropriate): Sitting Oxygen Therapy SpO2: 97 % O2 Device: Not Delivered Pain: Pt reported pain to be manageable during session   ADL: ADL ADL Comments: see Functional Assessment Tool    See Function Navigator for Current Functional Status.   Therapy/Group: Individual Therapy  Lutisha Knoche A Javae Braaten 10/14/2016, 4:33 PM

## 2016-10-14 NOTE — Progress Notes (Signed)
Orthopedic Tech Progress Note Patient Details:  Stephen Nixon 09/25/1941 [id number]  Patient ID: Stephen Nixon, male   DOB: 11/10/1941, 75 y.o.   MRN: [id number]   [name] 10/14/2016, 5:34 PMPatient refused CPM.

## 2016-10-14 NOTE — Progress Notes (Signed)
Physical Therapy Session Note  Patient Details  Name: Stephen Nixon MRN: [id number] Date of Birth: [birth year]  Today's Date: 10/14/2016 PT Individual Time: 1415-1500 PT Individual Time Calculation (min): 45 min      Skilled Therapeutic Interventions/Progress Updates:  Patient agrees to therapy interventions, c/o pain at minimal level, states he is ok and does not need any pain medicine at the moment Gait Training: 2 x 120 feet with RW with increased cues for posture, reduction of shoulder shrug and to decrease WB through UE. Heel to toe progression encouraged, but difficult to obtain due to reduced extension in KB Knees. ' TE: Therapeutic exercises focused on increase of ROM in B Knees for both flexion and extension. Weights and theraband used for added resistance . Patient performed knee extension with 5 lbs weights in prone with manual assistance in achieving increased flexion. Hold relax technique applied in order to increase ROM . Isometric exercises in supine to increase extension. In sitting patient able to achieve 94 degrees of flexion in L knee and 90 in R. At the end of session patient returned top room , left sitting EOB, handed over to OT.   Therapy Documentation Precautions:  Precautions Precautions: Fall, Knee Required Braces or Orthoses: Knee Immobilizer - Right, Knee Immobilizer - Left Knee Immobilizer - Right: Discontinue once straight leg raise with < 10 degree lag Knee Immobilizer - Left: Discontinue once straight leg raise with < 10 degree lag Restrictions Weight Bearing Restrictions: Yes RLE Weight Bearing: Weight bearing as tolerated LLE Weight Bearing: Weight bearing as tolerated   See Function Navigator for Current Functional Status.   Therapy/Group: Individual Therapy  Guadlupe Spanish 10/14/2016, 3:43 PM

## 2016-10-14 NOTE — Progress Notes (Signed)
Patient ID: Stephen Nixon, male   DOB: 07-05-1941, 75 y.o.   MRN: [id number]   10/14/16.   [facility] PHYSICAL MEDICINE & REHABILITATION     PROGRESS NOTE    75 year old patient admitted for CIR with functional and mobility deficits secondary to endstage OA of bilateral knees s/p bilateral TKA's.  Subjective/Complaints:  Doing well without new complaints.  His dermatitis is much less pruritic.  Knee pain is modest  ROS:  +Rash. Denies CP, SOB, N/V/D  Past Medical History:  Diagnosis Date  . Arthritis   . Closed fracture of left elbow with routine healing   . 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   . Impingement syndrome of left shoulder   . Measles   . Mumps      Objective: Vital Signs: Blood pressure 119/73, pulse (!) 101, temperature 99.2 F (37.3 C), temperature source Oral, resp. rate 17, height 5\' 10"  (1.778 m), weight 164 lb (74.4 kg), SpO2 94 %. No results found. No results for input(s): WBC, HGB, HCT, PLT in the last 72 hours. No results for input(s): NA, K, CL, GLUCOSE, BUN, CREATININE, CALCIUM in the last 72 hours.  Invalid input(s): CO CBG (last 3)  No results for input(s): GLUCAP in the last 72 hours.  Wt Readings from Last 3 Encounters:  10/11/16 164 lb (74.4 kg)  10/04/16 162 lb 12 oz (73.8 kg)  09/27/16 162 lb 12.8 oz (73.8 kg)   Lab Results  Component Value Date   INR 3.59 10/14/2016   INR 3.59 10/13/2016   INR 2.40 10/11/2016    Physical Exam:  Constitutional: He appears well-developed and well-nourished. No distress. Vital signs reviewed.  HENT: Normocephalic and atraumatic.  Eyes: EOMI.  No discharge.  Cardiovascular: RRR. No JVD. Respiratory: Effort normal and breath sounds normal. No stridor. No respiratory distress. He has no wheezes or rales.  GI: Soft. Bowel sounds are normal. He exhibits no distension. There is no tenderness.  Musculoskeletal: He exhibits edema and mild tenderness. Status post bilateral TKAs with  Steri-Strips still in place.  Incision is healing nicely  Neurological: He is alert and oriented. No cranial nerve deficit. Coordination normal.   Skin: Skin is warm and dry.  Knee incisions healing nicely with Steri-Strips still in place.  Resolving maculopapular rash most marked over the upper and lower back area      Medical Problem List and Plan: 1.  Functional and mobility deficits secondary to endstage OA of bilateral knees s/p bilateral TKA's.  Cont CIR 2.  DVT Prophylaxis/Anticoagulation: Pharmaceutical: Coumadin   INR  3.59 3. Pain Management: Encouraged to use robaxin for muscle spasms.   Oxycodone prn, weaned 10/30  Changed to Morphine due to rash on 11/3  4. ABLA: On iron bid.   Hb 9.2 on 10/31  Cont to monitor   Labs ordered for Monday 5. Leukocytosis:   WBCs 12.4 on 10/31 (improving)   Labs ordered for Monday  6. Rash - likely drug induced  Creams ordered, medications changed  Will cont to monitor  LOS (Days) 7 A FACE TO FACE EVALUATION WAS PERFORMED  Nyoka Cowden 10/14/2016 2:04 PM

## 2016-10-14 NOTE — Progress Notes (Signed)
Dr. Burnice Logan notified of temperature 101 and patient's general condition.

## 2016-10-15 ENCOUNTER — Inpatient Hospital Stay (HOSPITAL_COMMUNITY): Payer: Medicare Other | Admitting: Occupational Therapy

## 2016-10-15 LAB — CULTURE, BLOOD (ROUTINE X 2)
CULTURE: NO GROWTH
Culture: NO GROWTH

## 2016-10-15 LAB — PROTIME-INR
INR: 2.71
PROTHROMBIN TIME: 29.3 s — AB (ref 11.4–15.2)

## 2016-10-15 MED ORDER — WARFARIN SODIUM 5 MG PO TABS
2.5000 mg | ORAL_TABLET | Freq: Once | ORAL | Status: AC
  Administered 2016-10-15: 2.5 mg via ORAL
  Filled 2016-10-15: qty 1

## 2016-10-15 NOTE — Progress Notes (Signed)
Patient ID: Ayush Dinelli, male   DOB: 02-02-1941, 75 y.o.   MRN: [id number]    Patient ID: Alexader Sutherland, male   DOB: 1941-02-17, 75 y.o.   MRN: [id number]   10/15/16.   [facility] PHYSICAL MEDICINE & REHABILITATION     PROGRESS NOTE    75 year old patient admitted for CIR with functional and mobility deficits secondary to endstage OA of bilateral knees s/p bilateral TKA's.  Subjective/Complaints:  Doing well without new complaints.  His dermatitis is much less pruritic.  Knee pain is modest.  No new concerns or complaints.  Had a very comfortable night  ROS:  +Rash. Denies CP, SOB, N/V/D  Past Medical History:  Diagnosis Date  . Arthritis   . Closed fracture of left elbow with routine healing   . 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   . Impingement syndrome of left shoulder   . Measles   . Mumps      Objective: Vital Signs: Blood pressure 131/67, pulse 100, temperature 98.5 F (36.9 C), temperature source Oral, resp. rate 18, height 5\' 10"  (1.778 m), weight 164 lb (74.4 kg), SpO2 100 %. No results found.  Recent Labs  10/14/16 1746  WBC 11.9*  HGB 9.3*  HCT 28.4*  PLT 814*   No results for input(s): NA, K, CL, GLUCOSE, BUN, CREATININE, CALCIUM in the last 72 hours.  Invalid input(s): CO CBG (last 3)  No results for input(s): GLUCAP in the last 72 hours.  Wt Readings from Last 3 Encounters:  10/11/16 164 lb (74.4 kg)  10/04/16 162 lb 12 oz (73.8 kg)  09/27/16 162 lb 12.8 oz (73.8 kg)   Lab Results  Component Value Date   INR 2.71 10/15/2016   INR 3.59 10/14/2016   INR 3.59 10/13/2016    Physical Exam:  Constitutional: He appears well-developed and well-nourished. No distress. Vital signs reviewed.  HENT: Normocephalic and atraumatic.  Eyes: EOMI.  No discharge.  Cardiovascular: RRR. No JVD. Respiratory: Effort normal and breath sounds normal. No stridor. No respiratory distress. He has no wheezes or rales.  GI: Soft. Bowel sounds  are normal. He exhibits no distension. There is no tenderness.  Musculoskeletal: He exhibits edema and mild tenderness. Status post bilateral TKAs with Steri-Strips still in place.  Incision is healing nicely  Neurological: He is alert and oriented. No cranial nerve deficit. Coordination normal.   Skin: Skin is warm and dry.  Knee incisions healing nicely with Steri-Strips still in place.  Resolving maculopapular rash most marked over the upper and lower back area      Medical Problem List and Plan: 1.  Functional and mobility deficits secondary to endstage OA of bilateral knees s/p bilateral TKA's.  Cont CIR 2.  DVT Prophylaxis/Anticoagulation: Pharmaceutical: Coumadin   INR  2.71 3. Pain Management: Encouraged to use robaxin for muscle spasms.   Oxycodone prn, weaned 10/30  Changed to Morphine due to rash on 11/3  4. ABLA: On iron bid.   Hb 9.3 on 10/14/16  Cont to monitor    5. Leukocytosis:   WBCs 11.9  on 11/4 (improving)   6. Rash - likely drug induced.  Continues to improve  Creams ordered, medications changed  Will cont to monitor  LOS (Days) 8 A FACE TO FACE EVALUATION WAS PERFORMED  Nyoka Cowden 10/15/2016 9:53 AM

## 2016-10-15 NOTE — Progress Notes (Signed)
Orthopedic Tech Progress Note Patient Details:  Stephen Nixon May 17, 1941 [id number]  CPM Left Knee CPM Left Knee: On Left Knee Flexion (Degrees): 90 Left Knee Extension (Degrees): 0 Additional Comments:  (tolerated without complaint of) CPM Right Knee CPM Right Knee: On Right Knee Flexion (Degrees): 90 Right Knee Extension (Degrees): 0 Additional Comments:  (increased to 55)   [name] 10/15/2016, 6:49 PM

## 2016-10-15 NOTE — Progress Notes (Signed)
ANTICOAGULATION CONSULT NOTE - Follow Up Consult  Pharmacy Consult for coumadin Indication: VTE prophylaxis  Allergies  Allergen Reactions  . Tylenol [Acetaminophen] Rash    Patient Measurements: Height: 5\' 10"  (177.8 cm) Weight: 164 lb (74.4 kg) IBW/kg (Calculated) : 73  Vital Signs: Temp: 98.5 F (36.9 C) (11/05 0607) Temp Source: Oral (11/05 0607) BP: 131/67 (11/05 0607) Pulse Rate: 100 (11/05 0607)  Labs:  Recent Labs  10/13/16 0521 10/14/16 0607 10/14/16 1746 10/15/16 0603  HGB  --   --  9.3*  --   HCT  --   --  28.4*  --   PLT  --   --  814*  --   LABPROT 36.7* 36.7*  --  29.3*  INR 3.59 3.59  --  2.71    Estimated Creatinine Clearance: 84.9 mL/min (by C-G formula based on SCr of 0.76 mg/dL).  Assessment: 75 yo male is therapeutic on coumadin for VTE prophylaxis. INR down today and is therapeutic at 2.71. Of note, patient was previously therapeutic on 2.5 mg daily. No overt signs or symptoms of bleeding noted.   Goal of Therapy:  INR 2-3 Monitor platelets by anticoagulation protocol: Yes   Plan:  - Coumadin 2.5 mg po x1 - Resumed daily INR for now - Monitor CBC, s/sx of bleeding - F/u labs Monday   Argie Ramming, PharmD Pharmacy Resident  Pager [phone] 10/15/16 10:10 AM

## 2016-10-15 NOTE — Progress Notes (Signed)
Occupational Therapy Session Note  Patient Details  Name: Stephen Nixon MRN: [id number] Date of Birth: 06/08/1941  Today's Date: 10/15/2016 OT Individual Time: [id number] OT Individual Time Calculation (min): 60 min     Short Term Goals: Week 1:  OT Short Term Goal 1 (Week 1): STG = LTG d/t short anticipated LOS. Week 2:     Skilled Therapeutic Interventions/Progress Updates:   Skilled OT session completed with focus on ADL retraining and LE flexibility during IADL task completion. Pt gathered ADL items in room with RW and supervision. Bathing/dressing completed in tub room with overall supervision without AE and pt able to cross ankle over knee bilaterally to don TED stockings. ACE wraps donned for LEs with pt education on technique and good carryover demonstration. 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 Emphasis on improving LE flexibility during engagement in unilateral UE tasks to decrease UE reliance on walker. Afterwards, pt ambulated back to room and was transferred back to bed to rest. All needs within reach.    Therapy Documentation Precautions:  Precautions Precautions: Fall, Knee Required Braces or Orthoses: Knee Immobilizer - Right, Knee Immobilizer - Left Knee Immobilizer - Right: Discontinue once straight leg raise with < 10 degree lag Knee Immobilizer - Left: Discontinue once straight leg raise with < 10 degree lag Restrictions Weight Bearing Restrictions: Yes RLE Weight Bearing: Weight bearing as tolerated LLE Weight Bearing: Weight bearing as tolerated    Pain: No c/o pain during session    ADL: ADL ADL Comments: see Functional Assessment Tool     See Function Navigator for Current Functional Status.   Therapy/Group: Individual Therapy  Stephen Nixon A Leighanna Kirn 10/15/2016, 12:49 PM

## 2016-10-15 NOTE — Progress Notes (Signed)
Orthopedic Tech Progress Note Patient Details:  Stephen Nixon [birth year] [id number] Applied bi cpm 0-90     Karolee Stamps 10/15/2016, 6:42 AM

## 2016-10-16 ENCOUNTER — Inpatient Hospital Stay (HOSPITAL_COMMUNITY): Payer: Medicare Other | Admitting: Occupational Therapy

## 2016-10-16 ENCOUNTER — Inpatient Hospital Stay (HOSPITAL_COMMUNITY): Payer: Medicare Other | Admitting: Physical Therapy

## 2016-10-16 ENCOUNTER — Ambulatory Visit (HOSPITAL_COMMUNITY): Payer: Medicare Other | Admitting: Physical Therapy

## 2016-10-16 DIAGNOSIS — L270 Generalized skin eruption due to drugs and medicaments taken internally: Secondary | ICD-10-CM

## 2016-10-16 DIAGNOSIS — Z87891 Personal history of nicotine dependence: Secondary | ICD-10-CM

## 2016-10-16 DIAGNOSIS — R509 Fever, unspecified: Secondary | ICD-10-CM

## 2016-10-16 DIAGNOSIS — Z809 Family history of malignant neoplasm, unspecified: Secondary | ICD-10-CM

## 2016-10-16 DIAGNOSIS — Z886 Allergy status to analgesic agent status: Secondary | ICD-10-CM

## 2016-10-16 LAB — URINE CULTURE

## 2016-10-16 LAB — PROTIME-INR
INR: 3.07
Prothrombin Time: 32.4 seconds — ABNORMAL HIGH (ref 11.4–15.2)

## 2016-10-16 MED ORDER — CYCLOBENZAPRINE HCL 5 MG PO TABS
5.0000 mg | ORAL_TABLET | Freq: Three times a day (TID) | ORAL | Status: DC | PRN
Start: 2016-10-16 — End: 2016-10-17

## 2016-10-16 MED ORDER — WARFARIN SODIUM 1 MG PO TABS
1.0000 mg | ORAL_TABLET | Freq: Once | ORAL | Status: AC
Start: 2016-10-16 — End: 2016-10-16
  Administered 2016-10-16: 1 mg via ORAL
  Filled 2016-10-16: qty 1
  Filled 2016-10-16: qty 0.5

## 2016-10-16 NOTE — Progress Notes (Signed)
Social Work  Discharge Note  The overall goal for the admission was met for:   Discharge location: Yes-HOME WITH WIFE WHO CAN PROVIDE SUPERVISION LEVEL  Length of Stay: Yes-10 DAYS  Discharge activity level: Yes-MOD/I-SUPERVISION LEVEL  Home/community participation: Yes  Services provided included: MD, RD, PT, OT, RN, CM, Pharmacy and SW  Financial Services: Medicare and Private Insurance: [employer]  Follow-up services arranged: Outpatient: [facility] 11/10 @ 1:30 PM  Comments (or additional information):WIFE WAS HERE FOR FAMILY EDUCATION ON Monday BOTH FEEL COMFORTABLE WITH HIS CARE. [place]. WIFE HAS GOTTEN ALL OF THE NEEDED EQUIPMENT  Patient/Family verbalized understanding of follow-up arrangements: Yes  Individual responsible for coordination of the follow-up plan: SELF & JEAN-WIFE  Confirmed correct DME delivered: Elease Hashimoto 10/16/2016    Elease Hashimoto

## 2016-10-16 NOTE — Discharge Summary (Signed)
Physician Discharge Summary  Patient ID: Stephen Nixon MRN: [id number] DOB/AGE: 12/26/1940 75 y.o.  Admit date: 10/07/2016 Discharge date: 10/17/2016  Discharge Diagnoses:  Principal Problem:   Primary osteoarthritis of both knees Active Problems:   S/P TKR (total knee replacement), bilateral   Postoperative anemia due to acute blood loss   Leukocytosis   Hypokalemia   Constipation due to pain medication   Intermittent fever of unknown origin   Drug-induced skin rash   Discharged Condition: stable   Significant Diagnostic Studies: Dg Chest 2 View  Result Date: 10/09/2016 CLINICAL DATA:  Elevated temperature and heart rate EXAM: CHEST  2 VIEW COMPARISON:  None. FINDINGS: Mild diffuse interstitial prominence greatest in the lung bases, suspect chronic change. No acute infiltrate or effusion. Borderline heart size. No overt edema. No pneumothorax. IMPRESSION: 1. No focal infiltrate 2. Borderline heart size 3. Prominent interstitial opacities could relate to chronic change. Electronically Signed   By: Donavan Foil M.D.   On: 10/09/2016 22:18    Labs:  Basic Metabolic Panel: BMP Latest Ref Rng & Units 10/10/2016 10/09/2016 10/08/2016  Glucose 65 - 99 mg/dL 103(H) 102(H) 97  BUN 6 - 20 mg/dL 13 14 10   Creatinine 0.61 - 1.24 mg/dL 0.76 0.76 0.75  Sodium 135 - 145 mmol/L 137 135 137  Potassium 3.5 - 5.1 mmol/L 3.7 2.9(L) 3.1(L)  Chloride 101 - 111 mmol/L 99(L) 97(L) 99(L)  CO2 22 - 32 mmol/L 30 30 31   Calcium 8.9 - 10.3 mg/dL 8.2(L) 7.9(L) 8.1(L)    CBC: CBC Latest Ref Rng & Units 10/14/2016 10/10/2016 10/08/2016  WBC 4.0 - 10.5 K/uL 11.9(H) 12.4(H) 13.8(H)  Hemoglobin 13.0 - 17.0 g/dL 9.3(L) 9.2(L) 10.2(L)  Hematocrit 39.0 - 52.0 % 28.4(L) 27.0(L) 30.0(L)  Platelets 150 - 400 K/uL 814(H) 406(H) 242    Lab Results  Component Value Date   INR 2.77 10/17/2016   INR 3.07 10/16/2016   INR 2.71 10/15/2016     Brief HPI:    Stephen Nixon is a 75 year old male with  bilateral knee OA with pain and with failure of conservative therapy. He elected to undergo B-TKR on 10/04/16 by Dr. Wynelle Link. Post op WBAT and epidural removed on 10/27. On coumadin/lovenox bridge for DVT prophylaxis. Therapy ongoing and patient limited by pain and fatigue. Noted to have downward trend in H/H as well as leucocytosis. CIR was recommended for follow up therapy.    Hospital Course: Stephen Nixon was admitted to rehab 10/07/2016 for inpatient therapies to consist of PT and OT at least three hours five days a week. Past admission physiatrist, therapy team and rehab RN have worked together to provide customized collaborative inpatient rehab. Blood pressures have been stable and nausea has resolved after aggressive bowel program.   Bilateral knee incisions are healing well and are intact without drainage. He does have moderate edema with blanchable erythema but no warmth. Ortho was consulted and felt that wound were healing as expected. He continued to have intermittent fevers as high as 101 degrees and was pan-cultured. Blood cultures X 2, UCS X 2 and CXR were negative for infection. BLE dopplers were checked on 10/30 and repeated on 11/7 prior to discharge and have been negative for DVT.  Infectious disease was consulted for input and felt that no further work up needed due to lack of signs of infection.   CBC has been checked serially and WBC is trending downwards. H/H has stabilized to 9.3 and he is to continue on iron supplements  after discharge. OIC has resolved. He did develop heat rash as well as drug rash due to Oxycodone. MSIR has been used on prn basis with good relief and no side effects. He was supplemented briefly for hypokalemia and follow up labs show resolution. He has made steady progress during his rehab stay and is modified independent at discharge. INR is therapeutic at discharge and he is to follow up with [facility] tomorrow for monitoring of coumadin. He will  continue to receive follow up outpatient PT after discharge.    Rehab course: During patient's stay in rehab weekly team conferences were held to monitor patient's progress, set goals and discuss barriers to discharge. At admission, patient required moderate assist with ADL tasks and min assist with mobility. He has had improvement in activity tolerance, balance, postural control, as well as ability to compensate for deficits. He had improvement in functional use BLE as well as improved awareness. He is able to complete ADL tasks and use of AE.  He is ambulating 150' at modified independent level with use of RW. Wife has been educated on all aspects of care, mobility and importance of HEP.  He is able to climb 12 stairs with supervision.  ROM right knee is at 15 degree lag to 90 degrees and left knee is at  15- 91 degrees.   Disposition: Home   Diet: Regular.   Special Instructions: 1. Wash bilateral knees with soap and water. Pat dry. To contact MD incase of any problems with incision/wound--redness, swelling, increase in pain, drainage or if you develop fever or chills.  2. INR goal 2-3 range. Coumadin to continue till 11/28 then may resume ASA daily.  3. No driving or strenuous activity till cleared by MD.  4. Needs CBC/BMET rechecked in 1-2 weeks.     Medication List    STOP taking these medications   methocarbamol 500 MG tablet Commonly known as:  ROBAXIN   oxyCODONE 5 MG immediate release tablet Commonly known as:  Oxy IR/ROXICODONE     TAKE these medications   cyclobenzaprine 5 MG tablet Commonly known as:  FLEXERIL Take 1 tablet (5 mg total) by mouth 3 (three) times daily as needed for muscle spasms.   iron polysaccharides 150 MG capsule Commonly known as:  NIFEREX Take 1 capsule (150 mg total) by mouth daily.   Melatonin 3 MG Caps Take 3 mg by mouth at bedtime.   morphine 15 MG tablet--Rx # 21 pills  Commonly known as:  MSIR Take 1 tablet (15 mg total) by mouth every  8 (eight) hours as needed for severe pain.   senna-docusate 8.6-50 MG tablet Commonly known as:  Senokot-S Take 1 tablet by mouth at bedtime.   VITAMIN B-12 PO Take 1 tablet by mouth daily.   warfarin 2 MG tablet Commonly known as:  COUMADIN Take whole pill on Tue, Thus, Sat, Sun. Take 1/2 pill on Mon, Wed, Fri,      Follow-up Information    Ankit Lorie Phenix, MD Follow up.   Specialty:  Physical Medicine and Rehabilitation Why:  office will call you with follow up appointment Contact information: [address] [place] [address] [phone]        Gearlean Alf, MD. Call.   Specialty:  Orthopedic Surgery Why:  for follow up appointment in 1 week Contact information: [address] [place] [address] [id number]        Reita Cliche, MD. Call.   Specialty:  Internal Medicine Why:  for post hospital follow up in 2 weeks       [facility] Follow up.           SignedBary Leriche 10/17/2016, 4:21 PM

## 2016-10-16 NOTE — Progress Notes (Signed)
Orthopedic Tech Progress Note Patient Details:  Stephen Nixon [birth year] [id number]  Patient ID: Stephen Nixon, male   DOB: Jun 23, 1941, 75 y.o.   MRN: [id number] Pt refused cpm.  Karolee Stamps 10/16/2016, 5:56 AM

## 2016-10-16 NOTE — Progress Notes (Signed)
Physical Therapy Discharge Summary  Patient Details  Name: Stephen Nixon MRN: [id number] Date of Birth: 1941-08-29  Today's Date: 10/16/2016 PT Individual Time: 0109-3235 and 5732-2025 PT Individual Time Calculation (min): 34 min and 40 min   Patient has met 10 of 10 long term goals due to improved activity tolerance, improved balance, increased strength, increased range of motion, decreased pain and functional use of  right lower extremity and left lower extremity.  Patient to discharge at an ambulatory level Modified Independent.   Patient's care partner is independent to provide the necessary supervision assistance at discharge.  Reasons goals not met: All goals met  Recommendation:  Patient will benefit from ongoing skilled PT services in outpatient setting to continue to advance safe functional mobility, address ongoing impairments in bilat LE decreased ROM, strength, pain, impaired balance, gait, and minimize fall risk.  Equipment: None needed; pt has all necessary equipment  Reasons for discharge: treatment goals met and discharge from hospital  Patient/family agrees with progress made and goals achieved: Yes  PT Discharge  AM session: Pt received in bed; reporting 2-3/10 in bilat knees.  Edema, redness improved.  Performed bed mobility Mod I, donned shoes EOB Mod I and performed sit > stand from low bed position with improved knee flexion during sit > stand Mod I.  Pt ambulated x 150' with RW Mod I.  Performed bilat LE AAROM and strengthening at level 4 x 10 minutes.  Bilat LE ROM and strength re-assessed-see below.  Pt returned to room and to bed Mod I and ice packs placed on bilat knees for pain and edema management.  PM session: Pt received in bed with wife present for family education.  Education to focus on basic transfers, car transfers, gait with RW over smooth surfaces and compliant surfaces all at mod I level, one step negotiation and full flight of stair negotiation with  wife's supervision.  Also performed gait training x 150' with one UE support on rail or no AD for short distances with close supervision with improved upright posture.  Educated wife and patient on f/u therapy, signs and symptoms of infection and importance of performing HEP daily with addition of lying prone for gravity assisted hip flexor and hamstring stretches and applying ice frequently for pain and edema management.  Pt returned to room and to bed with Mod I.  No further questions from pt or wife.  Pain Pain Assessment Pain Score: 2  Pain Type: Surgical pain Pain Location: Knee Pain Orientation: Right;Left Pain Descriptors / Indicators: Discomfort Pain Onset: On-going Pain Intervention(s): Rest;Cold applied Sensation Sensation Light Touch: Appears Intact Stereognosis: Not tested Hot/Cold: Not tested Proprioception: Not tested Coordination Gross Motor Movements are Fluid and Coordinated: Not tested Fine Motor Movements are Fluid and Coordinated: Not tested Motor  Motor Motor: Other (comment) Motor - Discharge Observations: generalized weakness and decreased ROM bilat LE  Mobility Bed Mobility Bed Mobility: Sit to Supine;Supine to Sit Supine to Sit: 6: Modified independent (Device/Increase time) Sit to Supine: 6: Modified independent (Device/Increase time) Transfers Transfers: Yes Sit to Stand: 6: Modified independent (Device/Increase time) Stand to Sit: 6: Modified independent (Device/Increase time) Stand Pivot Transfers: 6: Modified independent (Device/Increase time) Locomotion  Ambulation Ambulation: Yes Ambulation/Gait Assistance: 6: Modified independent (Device/Increase time) Assistive device: Rolling walker Gait Gait: Yes Gait Pattern: Impaired Gait Pattern: Step-through pattern;Decreased hip/knee flexion - right;Decreased hip/knee flexion - left;Right flexed knee in stance;Left flexed knee in stance;Poor foot clearance - left;Poor foot clearance - right Stairs /  Additional Locomotion Stairs: Yes Stairs Assistance: 5: Supervision Stairs Assistance Details (indicate cue type and reason): Performed one step negotiation with RW and supervision for home entry/exit; performed negotiation of full flight of stairs with bilat UE support on rails to access second level bed/bathroom. Stair Management Technique: Two rails;Step to pattern;Forwards;With walker Number of Stairs: 12 Height of Stairs: 7 Wheelchair Mobility Wheelchair Mobility: No  Trunk/Postural Assessment  Cervical Assessment Cervical Assessment: Within Functional Limits Thoracic Assessment Thoracic Assessment: Within Functional Limits Lumbar Assessment Lumbar Assessment: Within Functional Limits Postural Control Postural Control: Deficits on evaluation  Balance Balance Balance Assessed: Yes Static Standing Balance Static Standing - Balance Support: No upper extremity supported;Bilateral upper extremity supported Static Standing - Level of Assistance: 6: Modified independent (Device/Increase time) Dynamic Standing Balance Dynamic Standing - Balance Support: Right upper extremity supported;Left upper extremity supported Dynamic Standing - Level of Assistance: 6: Modified independent (Device/Increase time) Extremity Assessment      RLE Assessment RLE Assessment: Exceptions to [facility] (4/5 within available range; -15 deg to 90 deg ) LLE Assessment LLE Assessment: Exceptions to [facility] (3/5 hip flex, 4/5 knee within available range -15 to 91 deg)   See Function Navigator for Current Functional Status.  Raylene Everts Faucette 10/16/2016, 10:58 AM

## 2016-10-16 NOTE — Progress Notes (Signed)
ANTICOAGULATION CONSULT NOTE - Follow Up Consult  Pharmacy Consult for coumadin Indication: VTE prophylaxis  Allergies  Allergen Reactions  . Tylenol [Acetaminophen] Rash    Patient Measurements: Height: 5\' 10"  (177.8 cm) Weight: 164 lb (74.4 kg) IBW/kg (Calculated) : 73 Heparin Dosing Weight:   Vital Signs: Temp: 99 F (37.2 C) (11/06 0603) Temp Source: Oral (11/06 0603) BP: 129/79 (11/06 0603) Pulse Rate: 108 (11/06 0603)  Labs:  Recent Labs  10/14/16 0607 10/14/16 1746 10/15/16 0603 10/16/16 0516  HGB  --  9.3*  --   --   HCT  --  28.4*  --   --   PLT  --  814*  --   --   LABPROT 36.7*  --  29.3* 32.4*  INR 3.59  --  2.71 3.07    Estimated Creatinine Clearance: 84.9 mL/min (by C-G formula based on SCr of 0.76 mg/dL).   Medications:  Scheduled:  . camphor-menthol   Topical TID AC  . iron polysaccharides  150 mg Oral Daily  . polyethylene glycol  17 g Oral TID  . senna-docusate  1 tablet Oral BID  . triamcinolone lotion   Topical TID  . Warfarin - Pharmacist Dosing Inpatient   Does not apply q1800   Infusions:    Assessment: 75 yo male is currently on slightly supratherapeutic coumadin for VTE prophylaxis.  INR today is 3.07.  Goal of Therapy:  INR 2-3 Monitor platelets by anticoagulation protocol: Yes   Plan:  - coumadin 1 mg po x1 - INR in am  Torryn Fiske, Tsz-Yin 10/16/2016,8:14 AM

## 2016-10-16 NOTE — Consult Note (Addendum)
[facility] for Infectious Disease       Reason for Consult: fever    Referring Physician: Dr. Posey Pronto  Principal Problem:   S/P TKR (total knee replacement), bilateral Active Problems:   Primary osteoarthritis of both knees   Postoperative anemia due to acute blood loss   Leukocytosis   Primary osteoarthritis of knees, bilateral   Post-operative pain   Acute blood loss anemia   Hypokalemia   Constipation due to pain medication   SIRS (systemic inflammatory response syndrome) (HCC)   Fever   Heat rash   Rash and nonspecific skin eruption   Drug-induced skin rash   . camphor-menthol   Topical TID AC  . iron polysaccharides  150 mg Oral Daily  . polyethylene glycol  17 g Oral TID  . senna-docusate  1 tablet Oral BID  . triamcinolone lotion   Topical TID  . warfarin  1 mg Oral ONCE-1800  . Warfarin - Pharmacist Dosing Inpatient   Does not apply q1800    Recommendations: Continue supportive care, no further work up unless symptoms develop Ok from ID standpoint for discharge    Assessment: He has had a mild fever to 100.4 yesterday and 101 the previous day.  No source to suggest it is related to active acute infection and the patient looks well.    Antibiotics: none  HPI: Stephen Nixon is a 75 y.o. male with bilateral knee OA and recetn replacement on 10/25 by Dr. Maureen Ralphs who is found in rehab now and consulted with fever as above.  He feels well and is ready for discharge.  His knee mobility has been improving, no cough, no dysuria, no pyuria, no current diarrhea.  No sick contacts.  ua and cxr done without signficant abnormalities.   Review of Systems:  Constitutional: negative for fatigue, malaise and anorexia Ears, nose, mouth, throat, and face: negative for earaches Respiratory: negative for cough or sputum Cardiovascular: negative for chest pressure/discomfort Gastrointestinal: negative for diarrhea All other systems reviewed and are negative    Past  Medical History:  Diagnosis Date  . Arthritis   . Closed fracture of left elbow with routine healing   . 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   . Impingement syndrome of left shoulder   . Measles   . Mumps     Social History  Substance Use Topics  . Smoking status: Former Smoker    Types: Cigarettes    Quit date: 09/18/1967  . Smokeless tobacco: Never Used  . Alcohol use 0.0 oz/week     Comment: occassionally    Family History  Problem Relation Age of Onset  . Cancer - Other Mother   . Healthy Father     Allergies  Allergen Reactions  . Tylenol [Acetaminophen] Rash    Physical Exam: Constitutional: in no apparent distress and alert  Vitals:   10/15/16 1625 10/16/16 0603  BP:  129/79  Pulse:  (!) 108  Resp:  18  Temp: 100.3 F (37.9 C) 99 F (37.2 C)   EYES: anicteric ENMT: no thrush Cardiovascular: Cor RRR Respiratory: CTA B; normal respiratory effort GI: Bowel sounds are normal, liver is not enlarged, spleen is not enlarged Musculoskeletal: no pedal edema noted Skin: negatives: no rash Hematologic: no cervical or supraclavicular lad  Lab Results  Component Value Date   WBC 11.9 (H) 10/14/2016   HGB 9.3 (L) 10/14/2016   HCT 28.4 (L) 10/14/2016   MCV 92.2 10/14/2016  PLT 814 (H) 10/14/2016    Lab Results  Component Value Date   CREATININE 0.76 10/10/2016   BUN 13 10/10/2016   NA 137 10/10/2016   K 3.7 10/10/2016   CL 99 (L) 10/10/2016   CO2 30 10/10/2016    Lab Results  Component Value Date   ALT 15 (L) 10/08/2016   AST 25 10/08/2016   ALKPHOS 64 10/08/2016     Microbiology: Recent Results (from the past 240 hour(s))  Culture, Urine     Status: Abnormal   Collection Time: 10/07/16  9:16 PM  Result Value Ref Range Status   Specimen Description URINE, CLEAN CATCH  Final   Special Requests NONE  Final   Culture <10,000 COLONIES/mL INSIGNIFICANT GROWTH (A)  Final   Report Status 10/09/2016 FINAL  Final  Culture, blood  (routine x 2)     Status: None   Collection Time: 10/10/16  8:45 AM  Result Value Ref Range Status   Specimen Description BLOOD RIGHT ARM  Final   Special Requests BOTTLES DRAWN AEROBIC AND ANAEROBIC 5CC  Final   Culture NO GROWTH 5 DAYS  Final   Report Status 10/15/2016 FINAL  Final  Respiratory Panel by PCR     Status: None   Collection Time: 10/10/16  8:46 AM  Result Value Ref Range Status   Adenovirus NOT DETECTED NOT DETECTED Final   Coronavirus 229E NOT DETECTED NOT DETECTED Final   Coronavirus HKU1 NOT DETECTED NOT DETECTED Final   Coronavirus NL63 NOT DETECTED NOT DETECTED Final   Coronavirus OC43 NOT DETECTED NOT DETECTED Final   Metapneumovirus NOT DETECTED NOT DETECTED Final   Rhinovirus / Enterovirus NOT DETECTED NOT DETECTED Final   Influenza A NOT DETECTED NOT DETECTED Final   Influenza B NOT DETECTED NOT DETECTED Final   Parainfluenza Virus 1 NOT DETECTED NOT DETECTED Final   Parainfluenza Virus 2 NOT DETECTED NOT DETECTED Final   Parainfluenza Virus 3 NOT DETECTED NOT DETECTED Final   Parainfluenza Virus 4 NOT DETECTED NOT DETECTED Final   Respiratory Syncytial Virus NOT DETECTED NOT DETECTED Final   Bordetella pertussis NOT DETECTED NOT DETECTED Final   Chlamydophila pneumoniae NOT DETECTED NOT DETECTED Final   Mycoplasma pneumoniae NOT DETECTED NOT DETECTED Final  Culture, blood (routine x 2)     Status: None   Collection Time: 10/10/16 12:08 PM  Result Value Ref Range Status   Specimen Description BLOOD RIGHT ARM  Final   Special Requests IN PEDIATRIC BOTTLE 3CC  Final   Culture NO GROWTH 5 DAYS  Final   Report Status 10/15/2016 FINAL  Final  Culture, Urine     Status: Abnormal   Collection Time: 10/14/16  7:06 PM  Result Value Ref Range Status   Specimen Description URINE, RANDOM  Final   Special Requests NONE  Final   Culture MULTIPLE SPECIES PRESENT, SUGGEST RECOLLECTION (A)  Final   Report Status 10/16/2016 FINAL  Final    Zenita Kister, Herbie [place], [facility] [email or web address] [phone] pager  [phone] cell 10/16/2016, 12:36 PM

## 2016-10-16 NOTE — Progress Notes (Signed)
Occupational Therapy Discharge Summary  Patient Details  Name: Stephen Nixon MRN: [id number] Date of Birth: 03-14-1941  Today's Date: 10/16/2016 OT Individual Time: 4098-1191 and 4782-9562 OT Individual Time Calculation (min): 73 min and 54 min    Patient has met 9 of 9 long term goals due to improved activity tolerance, improved balance and ability to compensate for deficits.  Patient to discharge at overall Modified Independent level.  His wife has attended family education.    Reasons goals not met: all goals met  Recommendation:  Pt does not need follow up OT intervention at this time  Equipment: No equipment provided  Reasons for discharge: treatment goals met  Patient/family agrees with progress made and goals achieved: Yes   OT Intervention:  Session 1: Upon entering the room, pt ambulating to bathroom with mod I and use of RW for BM. Pt performing clothing management and hygiene independently. Pt returning to sink for grooming tasks in standing and LB dressing. Pt declined further self care until this afternoon for family education. OT discussed discharge recommendations and answered questions pt had. Pt seated in wheelchair with breakfast tray in front of him. OT making pt mod I in room and explaining to pt expectations for safety. Pt verbalized understanding.   Session 2:  Pt's wife present this session for family education. Pt demonstrating ability to obtain all clothing items and ambulate to tub room with at mod I level with use of RW. Pt demonstrated transfer onto TTB with mod I. OT educated caregiver on TTB set up for home environment and recommended purchase of safety treads for tub to decrease fall risk. Pt performed bathing and dressing at mod I level this session. Pt able to cross ankle to knee in order to independently don clothing and TED hose. Pt returning to room in same manner. OT answering questions for caregiver and pt regarding discharge. No further concern at  this time.   OT Discharge Precautions/Restrictions Precautions Precautions: Fall  Pain Pain Assessment Pain Score: 2  Pain Type: Surgical pain Pain Location: Knee Pain Orientation: Right;Left Pain Descriptors / Indicators: Discomfort Pain Onset: On-going Pain Intervention(s): Rest;Cold applied ADL ADL ADL Comments: see Functional Assessment Tool Vision/Perception  Vision- History Baseline Vision/History: Wears glasses Wears Glasses: At all times Patient Visual Report: No change from baseline  Cognition Overall Cognitive Status: Within Functional Limits for tasks assessed Arousal/Alertness: Awake/alert Orientation Level: Oriented X4 Sensation Sensation Light Touch: Appears Intact Stereognosis: Not tested Hot/Cold: Not tested Proprioception: Not tested Coordination Gross Motor Movements are Fluid and Coordinated: Not tested Fine Motor Movements are Fluid and Coordinated: Not tested Motor  Motor Motor: Other (comment) Motor - Discharge Observations: generalized weakness and decreased ROM bilat LE Mobility  Bed Mobility Bed Mobility: Sit to Supine;Supine to Sit Supine to Sit: 6: Modified independent (Device/Increase time) Sit to Supine: 6: Modified independent (Device/Increase time) Transfers Sit to Stand: 6: Modified independent (Device/Increase time) Stand to Sit: 6: Modified independent (Device/Increase time)  Trunk/Postural Assessment  Cervical Assessment Cervical Assessment: Within Functional Limits Thoracic Assessment Thoracic Assessment: Within Functional Limits Lumbar Assessment Lumbar Assessment: Within Functional Limits Postural Control Postural Control: Deficits on evaluation  Balance Balance Balance Assessed: Yes Static Standing Balance Static Standing - Level of Assistance: 6: Modified independent (Device/Increase time) Dynamic Standing Balance Dynamic Standing - Level of Assistance: 6: Modified independent (Device/Increase  time) Extremity/Trunk Assessment RUE Assessment RUE Assessment: Within Functional Limits LUE Assessment LUE Assessment: Within Functional Limits   See Function Navigator for Current Functional Status.  Stephen Nixon 10/16/2016, 12:49 PM

## 2016-10-16 NOTE — Progress Notes (Signed)
[place] PHYSICAL MEDICINE & REHABILITATION     PROGRESS NOTE    Subjective/Complaints: Pt sitting up in bed this AM.  He slept well overnight and had a good weekend.  Notes improvement in rash and strength.   ROS:  Denies CP, SOB, N/V/D  Objective: Vital Signs: Blood pressure 129/79, pulse (!) 108, temperature 99 F (37.2 C), temperature source Oral, resp. rate 18, height 5\' 10"  (1.778 m), weight 74.4 kg (164 lb), SpO2 97 %. No results found.  Recent Labs  10/14/16 1746  WBC 11.9*  HGB 9.3*  HCT 28.4*  PLT 814*   No results for input(s): NA, K, CL, GLUCOSE, BUN, CREATININE, CALCIUM in the last 72 hours.  Invalid input(s): CO CBG (last 3)  No results for input(s): GLUCAP in the last 72 hours.  Wt Readings from Last 3 Encounters:  10/11/16 74.4 kg (164 lb)  10/04/16 73.8 kg (162 lb 12 oz)  09/27/16 73.8 kg (162 lb 12.8 oz)    Physical Exam:  Constitutional: He appears well-developed and well-nourished. No distress. Vital signs reviewed.  HENT: Normocephalic and atraumatic.  Eyes: EOMI.  No discharge.  Cardiovascular: RRR. No JVD. Respiratory: Effort normal and breath sounds normal. No stridor. No respiratory distress. He has no wheezes or rales.  GI: Soft. Bowel sounds are normal. He exhibits no distension. There is no tenderness.  Musculoskeletal: He exhibits edema and no tenderness.  AROM ~90 deg. Neurological: He is alert and oriented. No cranial nerve deficit. Coordination normal.  B/l UE 5/5.  LE: Hip flexion 4+/5, knee extension 4+/5, 4+/5 ADF/PF  Skin: Skin is warm and dry. He is not diaphoretic. Slight erythema surrounding knee incisions previously, dressed today c/d/i Psychiatric: He has a normal mood and affect. His behavior is normal. Thought content normal.   Assessment/Plan: 1. Functional and mobility deficits secondary to bilateral endstage OA/bilateral TKA's which require 3+ hours per day of interdisciplinary therapy in a comprehensive inpatient  rehab setting. Physiatrist is providing close team supervision and 24 hour management of active medical problems listed below. Physiatrist and rehab team continue to assess barriers to discharge/monitor patient progress toward functional and medical goals.  Function:  Bathing Bathing position   Position: Shower  Bathing parts Body parts bathed by patient: Right arm, Left arm, Chest, Abdomen, Front perineal area, Buttocks, Right upper leg, Left upper leg, Right lower leg, Left lower leg, Back Body parts bathed by helper: Back  Bathing assist Assist Level: Supervision or verbal cues Assistive Device Comment: LH sponge    Upper Body Dressing/Undressing Upper body dressing   What is the patient wearing?: Pull over shirt/dress     Pull over shirt/dress - Perfomed by patient: Put head through opening, Pull shirt over trunk, Thread/unthread right sleeve, Thread/unthread left sleeve          Upper body assist Assist Level: No help, No cues   Set up : To obtain clothing/put away  Lower Body Dressing/Undressing Lower body dressing   What is the patient wearing?: Pants, Non-skid slipper socks, Ted Hose     Pants- Performed by patient: Thread/unthread right pants leg, Thread/unthread left pants leg, Pull pants up/down Pants- Performed by helper: Thread/unthread right pants leg, Thread/unthread left pants leg, Pull pants up/down Non-skid slipper socks- Performed by patient: Don/doff right sock, Don/doff left sock Non-skid slipper socks- Performed by helper: Don/doff right sock, Don/doff left sock       Shoes - Performed by helper: Don/doff right shoe, Don/doff left shoe, Fasten right, [employer]  left     TED Hose - Performed by patient: Don/doff right TED hose, Don/doff left TED hose TED Hose - Performed by helper: Don/doff right TED hose, Don/doff left TED hose  Lower body assist Assist for lower body dressing: Supervision or verbal cues      Toileting Toileting   Toileting steps  completed by patient: Adjust clothing prior to toileting, Performs perineal hygiene, Adjust clothing after toileting Toileting steps completed by helper: Adjust clothing prior to toileting, Performs perineal hygiene, Adjust clothing after toileting Toileting Assistive Devices: Grab bar or rail  Toileting assist Assist level: Supervision or verbal cues   Transfers Chair/bed transfer   Chair/bed transfer method: Ambulatory Chair/bed transfer assist level: Supervision or verbal cues Chair/bed transfer assistive device: Armrests, Medical sales representative     Max distance: 200FT Assist level: Supervision or verbal cues   Wheelchair   Type: Manual Max wheelchair distance: 150 ft Assist Level: Supervision or verbal cues  Cognition Comprehension Comprehension assist level: Follows complex conversation/direction with no assist  Expression Expression assist level: Expresses complex ideas: With no assist  Social Interaction Social Interaction assist level: Interacts appropriately with others with medication or extra time (anti-anxiety, antidepressant).  Problem Solving Problem solving assist level: Solves complex problems: Recognizes & self-corrects  Memory Memory assist level: Complete Independence: No helper   Medical Problem List and Plan: 1.  Functional and mobility deficits secondary to endstage OA of bilateral knees s/p bilateral TKA's.  Cont CIR 2.  DVT Prophylaxis/Anticoagulation: Pharmaceutical: Coumadin   INR Supratherapeutic on 11/6  Will order doppler LE 3. Pain Management: Encouraged to use robaxin for muscle spasms.   Oxycodone prn, weaned 10/30  Changed to Morphine due to rash on 11/3 4. Mood: LCSW to follow for evaluation  And support.  5. Neuropsych: This patient is capable of making decisions on his own behalf. 6. Skin/Wound Care:  Routine pressure relief measures. Monitor wound for healing.    Evaluated by surgery last week, with no changes 7.  Fluids/Electrolytes/Nutrition: Monitor I/O. still 8. ABLA: On iron bid.   Hb 9.2 on 10/31  Cont to monitor   Labs pending  9. Leukocytosis:   WBCs 12.4 on 10/31 (improving)  UTI (UA equivocal), Ucx with insignificant growth  CXR 10/30 reviewed, unremarkable for acute process  Empiric keflex started, d/ced 10/30  Blood cultures remain NG   Respiratory panel negative  Labs pending  Will order doppler LE  Pt continues to have intermittent fevers, after a period of resolution, workup up thus far unremarkable, meeting SIRS criteria, will consider ID consult 10 Nausea: Resolved  advanced to regular diet 11. Fevers: recurrent intermittent 12. Hypokalemia  K+ 3.7 on 10/31  Supplemented  Labs pending 13. Opoid induced Constipation  Bowel reg increased on 10/30  Improving 14. Rash - likely drug induced, improving  Creams ordered, medications changed  Will cont to monitor  LOS (Days) 9 A FACE TO FACE EVALUATION WAS PERFORMED  Ankit Lorie Phenix 10/16/2016 8:24 AM

## 2016-10-17 ENCOUNTER — Inpatient Hospital Stay (HOSPITAL_COMMUNITY): Payer: Medicare Other

## 2016-10-17 DIAGNOSIS — R609 Edema, unspecified: Secondary | ICD-10-CM

## 2016-10-17 LAB — PROTIME-INR
INR: 2.77
PROTHROMBIN TIME: 29.8 s — AB (ref 11.4–15.2)

## 2016-10-17 MED ORDER — MORPHINE SULFATE 15 MG PO TABS: 15.0000 mg | ORAL_TABLET | Freq: Three times a day (TID) | ORAL | 0 refills | Status: DC | PRN

## 2016-10-17 MED ORDER — SENNOSIDES-DOCUSATE SODIUM 8.6-50 MG PO TABS: 1.0000 | ORAL_TABLET | Freq: Every day | ORAL | 0 refills | Status: DC

## 2016-10-17 MED ORDER — CYCLOBENZAPRINE HCL 5 MG PO TABS: 5.0000 mg | ORAL_TABLET | Freq: Three times a day (TID) | ORAL | 0 refills | Status: DC | PRN

## 2016-10-17 MED ORDER — WARFARIN SODIUM 2 MG PO TABS: ORAL_TABLET | ORAL | 0 refills | Status: DC

## 2016-10-17 MED ORDER — WARFARIN SODIUM 2 MG PO TABS: 2.0000 mg | ORAL_TABLET | Freq: Once | ORAL | Status: DC

## 2016-10-17 MED ORDER — POLYSACCHARIDE IRON COMPLEX 150 MG PO CAPS: 150.0000 mg | ORAL_CAPSULE | Freq: Every day | ORAL | 1 refills | Status: DC

## 2016-10-17 NOTE — Progress Notes (Signed)
Patient discharged to home accompanied by his wife.

## 2016-10-17 NOTE — Progress Notes (Signed)
*  PRELIMINARY RESULTS* Vascular Ultrasound Bilateral lower extremity venous duplex has been completed.  Preliminary findings: No evidence of deep vein thrombosis or baker's cysts bilaterally.   Stephen Nixon 10/17/2016, 11:19 AM

## 2016-10-17 NOTE — Progress Notes (Signed)
[place] PHYSICAL MEDICINE & REHABILITATION     PROGRESS NOTE    Subjective/Complaints: Pt sitting up in bed this AM.  He is ready for discharge today.  He was seen by ID yesterday for recurrent fevers, no new recs.   ROS:  Denies  CP, SOB, N/V/D  Objective: Vital Signs: Blood pressure (!) 141/77, pulse 94, temperature 98.5 F (36.9 C), temperature source Oral, resp. rate 18, height 5\' 10"  (1.778 m), weight 74.4 kg (164 lb), SpO2 97 %. No results found.  Recent Labs  10/14/16 1746  WBC 11.9*  HGB 9.3*  HCT 28.4*  PLT 814*   No results for input(s): NA, K, CL, GLUCOSE, BUN, CREATININE, CALCIUM in the last 72 hours.  Invalid input(s): CO CBG (last 3)  No results for input(s): GLUCAP in the last 72 hours.  Wt Readings from Last 3 Encounters:  10/11/16 74.4 kg (164 lb)  10/04/16 73.8 kg (162 lb 12 oz)  09/27/16 73.8 kg (162 lb 12.8 oz)    Physical Exam:  Constitutional: He appears well-developed and well-nourished. No distress. Vital signs reviewed.  HENT: Normocephalic and atraumatic.  Eyes: EOMI.  No discharge.  Cardiovascular: RRR. No JVD. Respiratory: Effort normal and breath sounds normal. No stridor. No respiratory distress. He has no wheezes or rales.  GI: Soft. Bowel sounds are normal. He exhibits no distension. There is no tenderness.  Musculoskeletal: He exhibits edema (improving) and no tenderness.  AROM ~90 deg. Neurological: He is alert and oriented. No cranial nerve deficit. Coordination normal.  B/l UE 5/5.  LE: Hip flexion 4+/5, knee extension 4+/5, 4+/5 ADF/PF  Skin: Skin is warm and dry. He is not diaphoretic. No erythema surrounding knee incisions.  Rash improving Psychiatric: He has a normal mood and affect. His behavior is normal. Thought content normal.   Assessment/Plan: 1. Functional and mobility deficits secondary to bilateral endstage OA/bilateral TKA's which require 3+ hours per day of interdisciplinary therapy in a comprehensive  inpatient rehab setting. Physiatrist is providing close team supervision and 24 hour management of active medical problems listed below. Physiatrist and rehab team continue to assess barriers to discharge/monitor patient progress toward functional and medical goals.  Function:  Bathing Bathing position   Position: Shower  Bathing parts Body parts bathed by patient: Right arm, Left arm, Chest, Abdomen, Front perineal area, Buttocks, Right upper leg, Left upper leg, Right lower leg, Left lower leg, Back Body parts bathed by helper: Back  Bathing assist Assist Level: More than reasonable time Assistive Device Comment: LH sponge    Upper Body Dressing/Undressing Upper body dressing   What is the patient wearing?: Pull over shirt/dress     Pull over shirt/dress - Perfomed by patient: Put head through opening, Pull shirt over trunk, Thread/unthread right sleeve, Thread/unthread left sleeve          Upper body assist Assist Level: No help, No cues   Set up : To obtain clothing/put away  Lower Body Dressing/Undressing Lower body dressing   What is the patient wearing?: Pants, Ted Hose, Shoes     Pants- Performed by patient: Thread/unthread right pants leg, Thread/unthread left pants leg, Pull pants up/down Pants- Performed by helper: Thread/unthread right pants leg, Thread/unthread left pants leg, Pull pants up/down Non-skid slipper socks- Performed by patient: Don/doff right sock, Don/doff left sock Non-skid slipper socks- Performed by helper: Don/doff right sock, Don/doff left sock     Shoes - Performed by patient: Don/doff right shoe, Don/doff left shoe Shoes - Performed  by helper: Don/doff right shoe, Don/doff left shoe, Fasten right, Fasten left     TED Hose - Performed by patient: Don/doff right TED hose, Don/doff left TED hose TED Hose - Performed by helper: Don/doff right TED hose, Don/doff left TED hose  Lower body assist Assist for lower body dressing: More than  reasonable time      Toileting Toileting   Toileting steps completed by patient: Adjust clothing prior to toileting, Performs perineal hygiene, Adjust clothing after toileting Toileting steps completed by helper: Adjust clothing prior to toileting, Performs perineal hygiene, Adjust clothing after toileting Toileting Assistive Devices: Grab bar or rail  Toileting assist Assist level: No help/no cues   Transfers Chair/bed transfer   Chair/bed transfer method: Ambulatory Chair/bed transfer assist level: No Help, no cues, assistive device, takes more than a reasonable amount of time Chair/bed transfer assistive device: Medical sales representative     Max distance: 200FT Assist level: No help, No cues, assistive device, takes more than a reasonable amount of time   Wheelchair Wheelchair activity did not occur: N/A Type: Manual Max wheelchair distance: 150 ft Assist Level: Supervision or verbal cues  Cognition Comprehension Comprehension assist level: Follows complex conversation/direction with no assist  Expression Expression assist level: Expresses complex ideas: With no assist  Social Interaction Social Interaction assist level: Interacts appropriately with others with medication or extra time (anti-anxiety, antidepressant).  Problem Solving Problem solving assist level: Solves complex problems: Recognizes & self-corrects  Memory Memory assist level: Complete Independence: No helper   Medical Problem List and Plan: 1.  Functional and mobility deficits secondary to endstage OA of bilateral knees s/p bilateral TKA's.  D/c today  Will see patient for transitional care management in 1-2 weeks, will need to monitor for fevers/infection. 2.  DVT Prophylaxis/Anticoagulation: Pharmaceutical: Coumadin   INR therapeutic on 11/7  Doppler LE pending 3. Pain Management: Encouraged to use robaxin for muscle spasms.   Oxycodone prn, weaned 10/30  Changed to Morphine due to rash on 11/3,  improving 4. Mood: LCSW to follow for evaluation  And support.  5. Neuropsych: This patient is capable of making decisions on his own behalf. 6. Skin/Wound Care:  Routine pressure relief measures. Monitor wound for healing.    Evaluated by surgery last week, with no changes 7. Fluids/Electrolytes/Nutrition: Monitor I/O. still 8. ABLA: On iron bid.   Hb 9.3 on 11/4  Cont to monitor  9. Leukocytosis:   WBCs 11.9 on 11/4 (improving)  UTI (UA equivocal), Ucx with insignificant growth  CXR 10/30 reviewed, unremarkable for acute process  Empiric keflex started, d/ced 10/30  Blood cultures remain NG   Respiratory panel negative  Doppler LE pending  Intermittent fevers, workup up thus far unremarkable, ID consulted - no new recs 10 Nausea: Resolved  advanced to regular diet 11. Fevers: recurrent intermittent 12. Hypokalemia  K+ 3.7 on 10/31  Supplemented 13. Opoid induced Constipation  Bowel reg increased on 10/30  Improving 14. Rash - likely drug induced, continues to improve  Creams ordered, medications changed  Will cont to monitor  LOS (Days) 10 A FACE TO FACE EVALUATION WAS PERFORMED  Ankit Lorie Phenix 10/17/2016 8:30 AM

## 2016-10-17 NOTE — Progress Notes (Signed)
ANTICOAGULATION CONSULT NOTE - Follow Up Consult  Pharmacy Consult for Coumadin Indication: VTE prophylaxis  Allergies  Allergen Reactions  . Tylenol [Acetaminophen] Rash    Patient Measurements: Height: 5\' 10"  (177.8 cm) Weight: 164 lb (74.4 kg) IBW/kg (Calculated) : 73  Vital Signs: Temp: 98.5 F (36.9 C) (11/07 0521) Temp Source: Oral (11/07 0521) BP: 141/77 (11/07 0521) Pulse Rate: 94 (11/07 0521)  Labs:  Recent Labs  10/14/16 1746 10/15/16 0603 10/16/16 0516 10/17/16 0721  HGB 9.3*  --   --   --   HCT 28.4*  --   --   --   PLT 814*  --   --   --   LABPROT  --  29.3* 32.4* 29.8*  INR  --  2.71 3.07 2.77    Estimated Creatinine Clearance: 84.9 mL/min (by C-G formula based on SCr of 0.76 mg/dL).  Assessment: 75yo male on therapeutic Coumadin.  No bleeding noted.  Doppler pending, plan for d/c home today.  INR has increased on average Coumadin 2mg /day (2.5mg  x 5 days & 1mg  x 2 days over last week).    Goal of Therapy:  INR 2-3 Monitor platelets by anticoagulation protocol: Yes   Plan:  Rx has been written for Coumadin 2mg  TTSS and 1mg  MWF; agree with this dose (average 1.6mg /day) Rec next INR 11/9 Watch for s/s of bleeding.  Gracy Bruins, PharmD Clinical Pharmacist [facility]

## 2016-10-17 NOTE — Discharge Instructions (Signed)
Inpatient Rehab Discharge Instructions  Stephen Nixon Discharge date and time:  11/7/117  Activities/Precautions/ Functional Status: Activity: no lifting, driving, or strenuous exercise for till cleared by MD Diet: regular diet Wound Care: keep wound clean and dry Contact MD if you develop any problems with your incision/wound--increase in redness, swelling, increase in pain, drainage or if you develop fever or chills.   Functional status:  ___ No restrictions     ___ Walk up steps independently ___ 24/7 supervision/assistance   ___ Walk up steps with assistance _X__ Intermittent supervision/assistance  __X_ Bathe/dress independently _X__ Walk with walker    _X__ Supervision with shower transfers.  ___ Walk Independently    ___ Shower independently ___ Walk with assistance    ___ Shower with assistance _X__ No alcohol     ___ Return to work/school ________  Special Instructions:    COMMUNITY REFERRALS UPON DISCHARGE:    Outpatient: PT  Agency:[facility]  OUTPATIENT PT   [phone]   Date of Last Service:10/17/2016  Appointment Date/Time:NOVEMBER 10-Friday @ 1:30 PM  Medical Equipment/Items Ordered:NO NEEDS-WIFE GOT FOR HIM     My questions have been answered and I understand these instructions. I will adhere to these goals and the provided educational materials after my discharge from the hospital.  Patient/Caregiver Signature _______________________________ Date __________  Clinician Signature _______________________________________ Date __________  Please bring this form and your medication list with you to all your follow-up doctor's appointments.

## 2016-10-18 ENCOUNTER — Telehealth: Payer: Self-pay

## 2016-10-18 ENCOUNTER — Telehealth: Payer: Self-pay | Admitting: Physical Medicine & Rehabilitation

## 2016-10-18 NOTE — Telephone Encounter (Signed)
Returning call to our clinic

## 2016-10-18 NOTE — Telephone Encounter (Signed)
First attempt to reach Stephen Nixon home/mobile # for Transitional care call. Left vm to give me a call back 10/18/2016 @ 11:18am

## 2016-10-19 NOTE — Telephone Encounter (Signed)
Transitional Care call Stephen Nixon wife   1. Are you/is patient experiencing any problems since coming home? No Are there any questions regarding any aspect of care?No 2. Are there any questions regarding medications administration/dosing?No Are meds being taken as prescribed? Yes, also has been following up with Coumadin  3. Have there been any falls? No 4. Has Home Health been to the house and/or have they contacted you? Doesn't need home health, patient will go out to Physical Therapy  5. Are bowels and bladder emptying properly? Yes 6. Any fevers, problems with breathing, unexpected pain? No 7. Are there any skin problems or new areas of breakdown? No 8. Has the patient/family member arranged specialty MD follow up (i.e. cardiology/neurology/renal/surgical/etc)? Yes  Can we help arrange? No 9. Does the patient need any other services or support that we can help arrange? No 10. Are caregivers following through as expected in assisting the patient? Yes 11. Has the patient quit smoking, drinking alcohol, or using drugs as recommended?  Patient doesn't use tobacco products, illegal drugs or alcohol.  Appointment time  And location Confirmed

## 2016-10-19 NOTE — Discharge Summary (Signed)
Physician Discharge Summary   Patient ID: Stephen Nixon MRN: [id number] DOB/AGE: 07/16/1941 75 y.o.  Admit date: 10/04/2016 Discharge date: 10/07/2016  Primary Diagnosis:  Osteoarthritis  Bilateral knee(s)  Admission Diagnoses:  Past Medical History:  Diagnosis Date  . Arthritis   . Closed fracture of left elbow with routine healing   . 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   . Impingement syndrome of left shoulder   . Measles   . Mumps    Discharge Diagnoses:   Principal Problem:   Primary osteoarthritis of both knees Active Problems:   OA (osteoarthritis) of knee  Estimated body mass index is 23.35 kg/m as calculated from the following:   Height as of this encounter: _0  (1.778 m).   Weight as of this encounter: 73.8 kg (162 lb 12 oz).  Procedure:  Procedure(s) (LRB): BILATERAL TOTAL KNEE ARTHROPLASTY (Bilateral)   Consults: [facility]  HPI: Stephen Nixon is a 40 y.o. year old male with end stage OA of both knees with progressively worsening pain and dysfunction. The patient has constant pain, with activity and at rest and significant functional deficits with difficulties even with ADLs. The patient has had extensive non-op management including analgesics, injections of cortisone and viscosupplements, and home exercise program, but remains in significant pain with significant dysfunction. We discussed replacing both knees in the same setting versus one at a time including procedure, risks, potential complications, rehab course, and pros and cons associated with each and the patient elects to do both knees at the same time. The patient presents now for bilateral Total Knee Arthroplasty.   Laboratory Data: Admission on 10/07/2016, Discharged on 10/17/2016  Component Date Value Ref Range Status  . WBC 10/08/2016 13.8* 4.0 - 10.5 K/uL Final  . RBC 10/08/2016 3.23* 4.22 - 5.81 MIL/uL Final  . Hemoglobin 10/08/2016 10.2* 13.0 - 17.0 g/dL Final    . HCT 10/08/2016 30.0* 39.0 - 52.0 % Final  . MCV 10/08/2016 92.9  78.0 - 100.0 fL Final  . MCH 10/08/2016 31.6  26.0 - 34.0 pg Final  . MCHC 10/08/2016 34.0  30.0 - 36.0 g/dL Final  . RDW 10/08/2016 13.7  11.5 - 15.5 % Final  . Platelets 10/08/2016 242  150 - 400 K/uL Final  . Neutrophils Relative % 10/08/2016 61  % Final  . Lymphocytes Relative 10/08/2016 23  % Final  . Monocytes Relative 10/08/2016 15  % Final  . Eosinophils Relative 10/08/2016 1  % Final  . Basophils Relative 10/08/2016 0  % Final  . Neutro Abs 10/08/2016 8.4* 1.7 - 7.7 K/uL Final  . Lymphs Abs 10/08/2016 3.2  0.7 - 4.0 K/uL Final  . Monocytes Absolute 10/08/2016 2.1* 0.1 - 1.0 K/uL Final  . Eosinophils Absolute 10/08/2016 0.1  0.0 - 0.7 K/uL Final  . Basophils Absolute 10/08/2016 0.0  0.0 - 0.1 K/uL Final  . RBC Morphology 10/08/2016 POLYCHROMASIA PRESENT   Final  . Sodium 10/08/2016 137  135 - 145 mmol/L Final  . Potassium 10/08/2016 3.1* 3.5 - 5.1 mmol/L Final  . Chloride 10/08/2016 99* 101 - 111 mmol/L Final  . CO2 10/08/2016 31  22 - 32 mmol/L Final  . Glucose, Bld 10/08/2016 97  65 - 99 mg/dL Final  . BUN 10/08/2016 10  6 - 20 mg/dL Final  . Creatinine, Ser 10/08/2016 0.75  0.61 - 1.24 mg/dL Final  . Calcium 10/08/2016 8.1* 8.9 - 10.3 mg/dL Final  . Total Protein 10/08/2016 5.5*  6.5 - 8.1 g/dL Final  . Albumin 10/08/2016 2.3* 3.5 - 5.0 g/dL Final  . AST 10/08/2016 25  15 - 41 U/L Final  . ALT 10/08/2016 15* 17 - 63 U/L Final  . Alkaline Phosphatase 10/08/2016 64  38 - 126 U/L Final  . Total Bilirubin 10/08/2016 1.1  0.3 - 1.2 mg/dL Final  . GFR calc non Af Amer 10/08/2016 >60  >60 mL/min Final  . GFR calc Af Amer 10/08/2016 >60  >60 mL/min Final   Comment: (NOTE) The eGFR has been calculated using the CKD EPI equation. This calculation has not been validated in all clinical situations. eGFR's persistently <60 mL/min signify possible Chronic Kidney Disease.   . Anion gap 10/08/2016 7  5 - 15 Final   . Color, Urine 10/07/2016 AMBER* YELLOW Final  . APPearance 10/07/2016 CLEAR  CLEAR Final  . Specific Gravity, Urine 10/07/2016 1.024  1.005 - 1.030 Final  . pH 10/07/2016 6.0  5.0 - 8.0 Final  . Glucose, UA 10/07/2016 NEGATIVE  NEGATIVE mg/dL Final  . Hgb urine dipstick 10/07/2016 SMALL* NEGATIVE Final  . Bilirubin Urine 10/07/2016 NEGATIVE  NEGATIVE Final  . Ketones, ur 10/07/2016 15* NEGATIVE mg/dL Final  . Protein, ur 10/07/2016 30* NEGATIVE mg/dL Final  . Nitrite 10/07/2016 NEGATIVE  NEGATIVE Final  . Leukocytes, UA 10/07/2016 NEGATIVE  NEGATIVE Final  . Specimen Description 10/09/2016 URINE, CLEAN CATCH   Final  . Special Requests 10/09/2016 NONE   Final  . Culture 10/09/2016 <10,000 COLONIES/mL INSIGNIFICANT GROWTH*  Final  . Report Status 10/09/2016 10/09/2016 FINAL   Final  . Prothrombin Time 10/08/2016 29.4* 11.4 - 15.2 seconds Final  . INR 10/08/2016 2.72   Final  . Squamous Epithelial / LPF 10/07/2016 6-30* NONE SEEN Final  . WBC, UA 10/07/2016 6-30  0 - 5 WBC/hpf Final  . RBC / HPF 10/07/2016 0-5  0 - 5 RBC/hpf Final  . Bacteria, UA 10/07/2016 FEW* NONE SEEN Final  . Urine-Other 10/07/2016 MUCOUS PRESENT   Final  . Prothrombin Time 10/09/2016 25.5* 11.4 - 15.2 seconds Final  . INR 10/09/2016 2.27   Final  . Sodium 10/09/2016 135  135 - 145 mmol/L Final  . Potassium 10/09/2016 2.9* 3.5 - 5.1 mmol/L Final  . Chloride 10/09/2016 97* 101 - 111 mmol/L Final  . CO2 10/09/2016 30  22 - 32 mmol/L Final  . Glucose, Bld 10/09/2016 102* 65 - 99 mg/dL Final  . BUN 10/09/2016 14  6 - 20 mg/dL Final  . Creatinine, Ser 10/09/2016 0.76  0.61 - 1.24 mg/dL Final  . Calcium 10/09/2016 7.9* 8.9 - 10.3 mg/dL Final  . GFR calc non Af Amer 10/09/2016 >60  >60 mL/min Final  . GFR calc Af Amer 10/09/2016 >60  >60 mL/min Final   Comment: (NOTE) The eGFR has been calculated using the CKD EPI equation. This calculation has not been validated in all clinical situations. eGFR's persistently <60  mL/min signify possible Chronic Kidney Disease.   . Anion gap 10/09/2016 8  5 - 15 Final  . Prothrombin Time 10/10/2016 28.9* 11.4 - 15.2 seconds Final  . INR 10/10/2016 2.66   Final  . Sodium 10/10/2016 137  135 - 145 mmol/L Final  . Potassium 10/10/2016 3.7  3.5 - 5.1 mmol/L Final  . Chloride 10/10/2016 99* 101 - 111 mmol/L Final  . CO2 10/10/2016 30  22 - 32 mmol/L Final  . Glucose, Bld 10/10/2016 103* 65 - 99 mg/dL Final  . BUN 10/10/2016 13  6 - 20 mg/dL Final  . Creatinine, Ser 10/10/2016 0.76  0.61 - 1.24 mg/dL Final  . Calcium 10/10/2016 8.2* 8.9 - 10.3 mg/dL Final  . GFR calc non Af Amer 10/10/2016 >60  >60 mL/min Final  . GFR calc Af Amer 10/10/2016 >60  >60 mL/min Final   Comment: (NOTE) The eGFR has been calculated using the CKD EPI equation. This calculation has not been validated in all clinical situations. eGFR's persistently <60 mL/min signify possible Chronic Kidney Disease.   . Anion gap 10/10/2016 8  5 - 15 Final  . WBC 10/10/2016 12.4* 4.0 - 10.5 K/uL Final  . RBC 10/10/2016 2.97* 4.22 - 5.81 MIL/uL Final  . Hemoglobin 10/10/2016 9.2* 13.0 - 17.0 g/dL Final  . HCT 10/10/2016 27.0* 39.0 - 52.0 % Final  . MCV 10/10/2016 90.9  78.0 - 100.0 fL Final  . MCH 10/10/2016 31.0  26.0 - 34.0 pg Final  . MCHC 10/10/2016 34.1  30.0 - 36.0 g/dL Final  . RDW 10/10/2016 13.6  11.5 - 15.5 % Final  . Platelets 10/10/2016 406* 150 - 400 K/uL Final  . Neutrophils Relative % 10/10/2016 62  % Final  . Lymphocytes Relative 10/10/2016 17  % Final  . Monocytes Relative 10/10/2016 18  % Final  . Eosinophils Relative 10/10/2016 3  % Final  . Basophils Relative 10/10/2016 0  % Final  . Neutro Abs 10/10/2016 7.7  1.7 - 7.7 K/uL Final  . Lymphs Abs 10/10/2016 2.1  0.7 - 4.0 K/uL Final  . Monocytes Absolute 10/10/2016 2.2* 0.1 - 1.0 K/uL Final  . Eosinophils Absolute 10/10/2016 0.4  0.0 - 0.7 K/uL Final  . Basophils Absolute 10/10/2016 0.0  0.0 - 0.1 K/uL Final  . RBC Morphology  10/10/2016 POLYCHROMASIA PRESENT   Final  . Specimen Description 10/15/2016 BLOOD RIGHT ARM   Final  . Special Requests 10/15/2016 BOTTLES DRAWN AEROBIC AND ANAEROBIC 5CC   Final  . Culture 10/15/2016 NO GROWTH 5 DAYS   Final  . Report Status 10/15/2016 10/15/2016 FINAL   Final  . Specimen Description 10/15/2016 BLOOD RIGHT ARM   Final  . Special Requests 10/15/2016 IN PEDIATRIC BOTTLE 3CC   Final  . Culture 10/15/2016 NO GROWTH 5 DAYS   Final  . Report Status 10/15/2016 10/15/2016 FINAL   Final  . Adenovirus 10/11/2016 NOT DETECTED  NOT DETECTED Final  . Coronavirus 229E 10/11/2016 NOT DETECTED  NOT DETECTED Final  . Coronavirus HKU1 10/11/2016 NOT DETECTED  NOT DETECTED Final  . Coronavirus NL63 10/11/2016 NOT DETECTED  NOT DETECTED Final  . Coronavirus OC43 10/11/2016 NOT DETECTED  NOT DETECTED Final  . Metapneumovirus 10/11/2016 NOT DETECTED  NOT DETECTED Final  . Rhinovirus / Enterovirus 10/11/2016 NOT DETECTED  NOT DETECTED Final  . Influenza A 10/11/2016 NOT DETECTED  NOT DETECTED Final  . Influenza B 10/11/2016 NOT DETECTED  NOT DETECTED Final  . Parainfluenza Virus 1 10/11/2016 NOT DETECTED  NOT DETECTED Final  . Parainfluenza Virus 2 10/11/2016 NOT DETECTED  NOT DETECTED Final  . Parainfluenza Virus 3 10/11/2016 NOT DETECTED  NOT DETECTED Final  . Parainfluenza Virus 4 10/11/2016 NOT DETECTED  NOT DETECTED Final  . Respiratory Syncytial Virus 10/11/2016 NOT DETECTED  NOT DETECTED Final  . Bordetella pertussis 10/11/2016 NOT DETECTED  NOT DETECTED Final  . Chlamydophila pneumoniae 10/11/2016 NOT DETECTED  NOT DETECTED Final  . Mycoplasma pneumoniae 10/11/2016 NOT DETECTED  NOT DETECTED Final  . Prothrombin Time 10/11/2016 26.6* 11.4 - 15.2 seconds Final  .  INR 10/11/2016 2.40   Final  . Prothrombin Time 10/13/2016 36.7* 11.4 - 15.2 seconds Final  . INR 10/13/2016 3.59   Final  . Prothrombin Time 10/14/2016 36.7* 11.4 - 15.2 seconds Final  . INR 10/14/2016 3.59   Final  .  Prothrombin Time 10/15/2016 29.3* 11.4 - 15.2 seconds Final  . INR 10/15/2016 2.71   Final  . WBC 10/14/2016 11.9* 4.0 - 10.5 K/uL Final  . RBC 10/14/2016 3.08* 4.22 - 5.81 MIL/uL Final  . Hemoglobin 10/14/2016 9.3* 13.0 - 17.0 g/dL Final  . HCT 10/14/2016 28.4* 39.0 - 52.0 % Final  . MCV 10/14/2016 92.2  78.0 - 100.0 fL Final  . MCH 10/14/2016 30.2  26.0 - 34.0 pg Final  . MCHC 10/14/2016 32.7  30.0 - 36.0 g/dL Final  . RDW 10/14/2016 13.8  11.5 - 15.5 % Final  . Platelets 10/14/2016 814* 150 - 400 K/uL Final  . Color, Urine 10/14/2016 AMBER* YELLOW Final  . APPearance 10/14/2016 CLEAR  CLEAR Final  . Specific Gravity, Urine 10/14/2016 1.018  1.005 - 1.030 Final  . pH 10/14/2016 5.5  5.0 - 8.0 Final  . Glucose, UA 10/14/2016 NEGATIVE  NEGATIVE mg/dL Final  . Hgb urine dipstick 10/14/2016 NEGATIVE  NEGATIVE Final  . Bilirubin Urine 10/14/2016 SMALL* NEGATIVE Final  . Ketones, ur 10/14/2016 NEGATIVE  NEGATIVE mg/dL Final  . Protein, ur 10/14/2016 NEGATIVE  NEGATIVE mg/dL Final  . Nitrite 10/14/2016 NEGATIVE  NEGATIVE Final  . Leukocytes, UA 10/14/2016 TRACE* NEGATIVE Final  . Specimen Description 10/16/2016 URINE, RANDOM   Final  . Special Requests 10/16/2016 NONE   Final  . Culture 10/16/2016 MULTIPLE SPECIES PRESENT, SUGGEST RECOLLECTION*  Final  . Report Status 10/16/2016 10/16/2016 FINAL   Final  . Squamous Epithelial / LPF 10/14/2016 0-5* NONE SEEN Final  . WBC, UA 10/14/2016 0-5  0 - 5 WBC/hpf Final  . RBC / HPF 10/14/2016 0-5  0 - 5 RBC/hpf Final  . Bacteria, UA 10/14/2016 RARE* NONE SEEN Final  . Urine-Other 10/14/2016 MUCOUS PRESENT   Final  . Prothrombin Time 10/16/2016 32.4* 11.4 - 15.2 seconds Final  . INR 10/16/2016 3.07   Final  . Prothrombin Time 10/17/2016 29.8* 11.4 - 15.2 seconds Final  . INR 10/17/2016 2.77   Final  Admission on 10/04/2016, Discharged on 10/07/2016  Component Date Value Ref Range Status  . Color, Urine 09/28/2016 YELLOW  YELLOW Final  .  APPearance 09/28/2016 CLOUDY* CLEAR Final  . Specific Gravity, Urine 09/28/2016 1.022  1.005 - 1.030 Final  . pH 09/28/2016 6.0  5.0 - 8.0 Final  . Glucose, UA 09/28/2016 NEGATIVE  NEGATIVE mg/dL Final  . Hgb urine dipstick 09/28/2016 NEGATIVE  NEGATIVE Final  . Bilirubin Urine 09/28/2016 NEGATIVE  NEGATIVE Final  . Ketones, ur 09/28/2016 NEGATIVE  NEGATIVE mg/dL Final  . Protein, ur 09/28/2016 NEGATIVE  NEGATIVE mg/dL Final  . Nitrite 09/28/2016 NEGATIVE  NEGATIVE Final  . Leukocytes, UA 09/28/2016 NEGATIVE  NEGATIVE Final  . WBC 10/05/2016 17.6* 4.0 - 10.5 K/uL Final  . RBC 10/05/2016 3.78* 4.22 - 5.81 MIL/uL Final  . Hemoglobin 10/05/2016 11.9* 13.0 - 17.0 g/dL Final  . HCT 10/05/2016 35.5* 39.0 - 52.0 % Final  . MCV 10/05/2016 93.9  78.0 - 100.0 fL Final  . MCH 10/05/2016 31.5  26.0 - 34.0 pg Final  . MCHC 10/05/2016 33.5  30.0 - 36.0 g/dL Final  . RDW 10/05/2016 13.9  11.5 - 15.5 % Final  . Platelets 10/05/2016 277  150 - 400 K/uL Final  . Sodium 10/05/2016 142  135 - 145 mmol/L Final  . Potassium 10/05/2016 4.0  3.5 - 5.1 mmol/L Final  . Chloride 10/05/2016 108  101 - 111 mmol/L Final  . CO2 10/05/2016 27  22 - 32 mmol/L Final  . Glucose, Bld 10/05/2016 138* 65 - 99 mg/dL Final  . BUN 10/05/2016 16  6 - 20 mg/dL Final  . Creatinine, Ser 10/05/2016 0.89  0.61 - 1.24 mg/dL Final  . Calcium 10/05/2016 8.4* 8.9 - 10.3 mg/dL Final  . GFR calc non Af Amer 10/05/2016 >60  >60 mL/min Final  . GFR calc Af Amer 10/05/2016 >60  >60 mL/min Final   Comment: (NOTE) The eGFR has been calculated using the CKD EPI equation. This calculation has not been validated in all clinical situations. eGFR's persistently <60 mL/min signify possible Chronic Kidney Disease.   . Anion gap 10/05/2016 7  5 - 15 Final  . Prothrombin Time 10/05/2016 14.6  11.4 - 15.2 seconds Final  . INR 10/05/2016 1.14   Final  . WBC 10/06/2016 19.6* 4.0 - 10.5 K/uL Final  . RBC 10/06/2016 3.25* 4.22 - 5.81 MIL/uL Final    . Hemoglobin 10/06/2016 10.2* 13.0 - 17.0 g/dL Final  . HCT 10/06/2016 30.0* 39.0 - 52.0 % Final  . MCV 10/06/2016 92.3  78.0 - 100.0 fL Final  . MCH 10/06/2016 31.4  26.0 - 34.0 pg Final  . MCHC 10/06/2016 34.0  30.0 - 36.0 g/dL Final  . RDW 10/06/2016 13.7  11.5 - 15.5 % Final  . Platelets 10/06/2016 229  150 - 400 K/uL Final  . Sodium 10/06/2016 137  135 - 145 mmol/L Final  . Potassium 10/06/2016 3.5  3.5 - 5.1 mmol/L Final  . Chloride 10/06/2016 103  101 - 111 mmol/L Final  . CO2 10/06/2016 27  22 - 32 mmol/L Final  . Glucose, Bld 10/06/2016 129* 65 - 99 mg/dL Final  . BUN 10/06/2016 17  6 - 20 mg/dL Final  . Creatinine, Ser 10/06/2016 0.71  0.61 - 1.24 mg/dL Final  . Calcium 10/06/2016 8.3* 8.9 - 10.3 mg/dL Final  . GFR calc non Af Amer 10/06/2016 >60  >60 mL/min Final  . GFR calc Af Amer 10/06/2016 >60  >60 mL/min Final   Comment: (NOTE) The eGFR has been calculated using the CKD EPI equation. This calculation has not been validated in all clinical situations. eGFR's persistently <60 mL/min signify possible Chronic Kidney Disease.   . Anion gap 10/06/2016 7  5 - 15 Final  . Prothrombin Time 10/06/2016 14.9  11.4 - 15.2 seconds Final  . INR 10/06/2016 1.16   Final  . WBC 10/07/2016 16.5* 4.0 - 10.5 K/uL Final  . RBC 10/07/2016 3.14* 4.22 - 5.81 MIL/uL Final  . Hemoglobin 10/07/2016 9.9* 13.0 - 17.0 g/dL Final  . HCT 10/07/2016 28.9* 39.0 - 52.0 % Final  . MCV 10/07/2016 92.0  78.0 - 100.0 fL Final  . MCH 10/07/2016 31.5  26.0 - 34.0 pg Final  . MCHC 10/07/2016 34.3  30.0 - 36.0 g/dL Final  . RDW 10/07/2016 13.6  11.5 - 15.5 % Final  . Platelets 10/07/2016 215  150 - 400 K/uL Final  . Prothrombin Time 10/07/2016 26.2* 11.4 - 15.2 seconds Final  . INR 10/07/2016 2.36   Final  Hospital Outpatient Visit on 09/27/2016  Component Date Value Ref Range Status  . aPTT 09/27/2016 31  24 - 36 seconds Final  . WBC 09/27/2016  9.1  4.0 - 10.5 K/uL Final  . RBC 09/27/2016 4.52  4.22  - 5.81 MIL/uL Final  . Hemoglobin 09/27/2016 14.2  13.0 - 17.0 g/dL Final  . HCT 09/27/2016 42.3  39.0 - 52.0 % Final  . MCV 09/27/2016 93.6  78.0 - 100.0 fL Final  . MCH 09/27/2016 31.4  26.0 - 34.0 pg Final  . MCHC 09/27/2016 33.6  30.0 - 36.0 g/dL Final  . RDW 09/27/2016 14.0  11.5 - 15.5 % Final  . Platelets 09/27/2016 311  150 - 400 K/uL Final  . Sodium 09/27/2016 140  135 - 145 mmol/L Final  . Potassium 09/27/2016 4.0  3.5 - 5.1 mmol/L Final  . Chloride 09/27/2016 103  101 - 111 mmol/L Final  . CO2 09/27/2016 28  22 - 32 mmol/L Final  . Glucose, Bld 09/27/2016 92  65 - 99 mg/dL Final  . BUN 09/27/2016 17  6 - 20 mg/dL Final  . Creatinine, Ser 09/27/2016 0.70  0.61 - 1.24 mg/dL Final  . Calcium 09/27/2016 9.0  8.9 - 10.3 mg/dL Final  . Total Protein 09/27/2016 7.0  6.5 - 8.1 g/dL Final  . Albumin 09/27/2016 3.9  3.5 - 5.0 g/dL Final  . AST 09/27/2016 24  15 - 41 U/L Final  . ALT 09/27/2016 14* 17 - 63 U/L Final  . Alkaline Phosphatase 09/27/2016 75  38 - 126 U/L Final  . Total Bilirubin 09/27/2016 1.0  0.3 - 1.2 mg/dL Final  . GFR calc non Af Amer 09/27/2016 >60  >60 mL/min Final  . GFR calc Af Amer 09/27/2016 >60  >60 mL/min Final   Comment: (NOTE) The eGFR has been calculated using the CKD EPI equation. This calculation has not been validated in all clinical situations. eGFR's persistently <60 mL/min signify possible Chronic Kidney Disease.   . Anion gap 09/27/2016 9  5 - 15 Final  . Prothrombin Time 09/27/2016 13.0  11.4 - 15.2 seconds Final  . INR 09/27/2016 0.98   Final  . ABO/RH(D) 10/04/2016 B POS   Final  . Antibody Screen 10/04/2016 NEG   Final  . Sample Expiration 10/04/2016 10/07/2016   Final  . Extend sample reason 10/04/2016 NO TRANSFUSIONS OR PREGNANCY IN THE PAST 3 MONTHS   Final  . MRSA, PCR 09/27/2016 NEGATIVE  NEGATIVE Final  . Staphylococcus aureus 09/27/2016 NEGATIVE  NEGATIVE Final   Comment:        The Xpert SA Assay (FDA approved for NASAL  specimens in patients over 52 years of age), is one component of a comprehensive surveillance program.  Test performance has been validated by [facility] for patients greater than or equal to 81 year old. It is not intended to diagnose infection nor to guide or monitor treatment.   . ABO/RH(D) 09/27/2016 B POS   Final     X-Rays:Dg Chest 2 View  Result Date: 10/09/2016 CLINICAL DATA:  Elevated temperature and heart rate EXAM: CHEST  2 VIEW COMPARISON:  None. FINDINGS: Mild diffuse interstitial prominence greatest in the lung bases, suspect chronic change. No acute infiltrate or effusion. Borderline heart size. No overt edema. No pneumothorax. IMPRESSION: 1. No focal infiltrate 2. Borderline heart size 3. Prominent interstitial opacities could relate to chronic change. Electronically Signed   By: Donavan Foil M.D.   On: 10/09/2016 22:18    EKG:No orders found for this or any previous visit.   Hospital Course: Patient was admitted to [facility] and taken to the OR and underwent  the above stated procedure well without complications.  Patient tolerated the procedure well and was later transferred to the recovery room and then to the orthopaedic floor for postoperative care. Anesthesia was consulted postoperatively to place an epidural in for postoperative pain management. The patient was also given PO and IV analgesics for pain control following their surgery.  They were given 24 hours of postoperative antibiotics and started on DVT prophylaxis in the form of Lovenox and Coumadin after the epidural had been removed.   PT and OT were ordered for total joint protocol.  Discharge planning consulted to help with postop disposition and equipment needs.    Patient had a decent night on the evening of surgery and started to get up OOB with therapy on day one.    Hemovac drains were pulled without difficulty on day one.  Continued to work with therapy into day two.  Dressings were changed on  day two and both incisions were healing well.  The epidural was removed without difficulty by Anesthesia on day two.  By day three, the patient started to show progress with therapy.  They continued to receive therapy each day for continued total knee protocol.  The incisions were healing well. Seen on day three and the bed at [facility] was available and was transferred over to [facility],  Take Coumadin for 4 weeks and then discontinue.  The dose may need to be adjusted based upon the INR.  Please follow the INR and titrate Coumadin dose for a therapeutic range between 2.0 and 3.0 INR.  After completing the 4 weeks of Coumadin, the patient may stop the Coumadin and resume their 81 mg Aspirin daily.  Continue Lovenox injections until the INR is therapeutic at or greater than 2.0.  When INR reaches the therapeutic level of equal to or greater than 2.0, the patient may discontinue the Lovenox injections.   Diet: Regular diet Activity:WBAT Follow-up:in 2 weeks Disposition - Rehab Discharged Condition: good   Discharge Instructions    Call MD / Call 911    Complete by:  As directed    If you experience chest pain or shortness of breath, CALL 911 and be transported to the hospital emergency room.  If you develope a fever above 101 F, pus (white drainage) or increased drainage or redness at the wound, or calf pain, call your surgeon's office.   Constipation Prevention    Complete by:  As directed    Drink plenty of fluids.  Prune juice may be helpful.  You may use a stool softener, such as Colace (over the counter) 100 mg twice a day.  Use MiraLax (over the counter) for constipation as needed.   Diet - low sodium heart healthy    Complete by:  As directed    Increase activity slowly as tolerated    Complete by:  As directed        Medication List    STOP taking these medications   aspirin 325 MG tablet   ESTER-C TR PO   ibuprofen 200 MG tablet Commonly known as:  ADVIL,MOTRIN    traMADol 50 MG tablet Commonly known as:  ULTRAM     TAKE these medications   Melatonin 3 MG Caps Take 3 mg by mouth at bedtime.   VITAMIN B-12 PO Take 1 tablet by mouth daily.        Signed: Arlee Muslim, PA-C Orthopaedic Surgery 10/19/2016, 8:09 AM

## 2016-10-20 ENCOUNTER — Ambulatory Visit (INDEPENDENT_AMBULATORY_CARE_PROVIDER_SITE_OTHER): Payer: Medicare Other | Admitting: Physical Therapy

## 2016-10-20 DIAGNOSIS — M25562 Pain in left knee: Secondary | ICD-10-CM | POA: Diagnosis not present

## 2016-10-20 DIAGNOSIS — R600 Localized edema: Secondary | ICD-10-CM

## 2016-10-20 DIAGNOSIS — R2689 Other abnormalities of gait and mobility: Secondary | ICD-10-CM | POA: Diagnosis not present

## 2016-10-20 DIAGNOSIS — M25662 Stiffness of left knee, not elsewhere classified: Secondary | ICD-10-CM

## 2016-10-20 DIAGNOSIS — M25561 Pain in right knee: Secondary | ICD-10-CM | POA: Diagnosis not present

## 2016-10-20 DIAGNOSIS — M25661 Stiffness of right knee, not elsewhere classified: Secondary | ICD-10-CM | POA: Diagnosis not present

## 2016-10-20 DIAGNOSIS — M6281 Muscle weakness (generalized): Secondary | ICD-10-CM

## 2016-10-20 NOTE — Therapy (Signed)
[place], [place], [address] Phone: [phone]   Fax:  [phone]  Physical Therapy Evaluation  Patient Details  Name: Stephen Nixon MRN: [id number] Date of Birth: 07-07-1941 Referring Provider: Delice Lesch, MD  Encounter Date: 10/20/2016      PT End of Session - 10/20/16 1511    Visit Number 1   Number of Visits 18   Date for PT Re-Evaluation 12/01/16   Authorization Time Period G-codes at 10th visit   PT Start Time 1336   PT Stop Time 1513   PT Time Calculation (min) 97 min   Activity Tolerance Patient tolerated treatment well   Behavior During Therapy [facility] for tasks assessed/performed      Past Medical History:  Diagnosis Date  . Arthritis   . Closed fracture of left elbow with routine healing   . Hairy cell leukemia ([place]) 1990   due to agent orange.   . Impingement syndrome of left shoulder   . Measles   . Mumps     Past Surgical History:  Procedure Laterality Date  . 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 TONSILLECTOMY    . TOTAL KNEE ARTHROPLASTY Bilateral 10/04/2016   Procedure: BILATERAL TOTAL KNEE ARTHROPLASTY;  Surgeon: Gaynelle Arabian, MD;  Location: [facility];  Service: Orthopedics;  Laterality: Bilateral;    There were no vitals filed for this visit.       Subjective Assessment - 10/20/16 1342    Subjective After about 10 years, pt states that he finally decided to proceed with getting his knees replaced. Pt had bilateral THA on 10/04/16. Following his acute stay he transfered to [facility] and went home on 10/17/16.  Doing his HEP, at least some of them.  Spouse helping as needed at this time, mostly with medications/meals.     Patient is accompained by: Family member   How long can you sit comfortably? unlimited   How long can you stand comfortably? 10 minutes   How long can you walk comfortably? 10 minutes   Patient Stated Goals Get back to traveling. Be  active.   Currently in Pain? Yes   Pain Score 2   at rest in sitting   Pain Location Knee   Pain Orientation Right;Left   Pain Descriptors / Indicators Aching   Pain Type Surgical pain   Pain Onset 1 to 4 weeks ago   Pain Frequency Constant   Aggravating Factors  too active   Pain Relieving Factors medication            [facility] PT Assessment - 10/20/16 0001      Assessment   Medical Diagnosis bilateral TKA   Referring Provider Delice Lesch, MD   Onset Date/Surgical Date 10/04/16   Next MD Visit 10/25/16   Prior Therapy none     Restrictions   Weight Bearing Restrictions No     Balance Screen   Has the patient fallen in the past 6 months No     Observation/Other Assessments   Observations Moderate effusion bilateral kness with no erythema. Incisions are dry.    Focus on Therapeutic Outcomes (FOTO)  70% limitation     Sensation   Light Touch Appears Intact     AROM   Right Knee Extension 19   Right Knee Flexion 97   Left Knee Extension 20   Left Knee Flexion 92     Strength   Right Hip ABduction 4/5  Right Hip ADduction 4/5   Left Hip ABduction 4/5   Left Hip ADduction 4+/5   Right Knee Flexion 4/5   Right Knee Extension 4/5   Left Knee Flexion 4/5   Left Knee Extension 4/5     Palpation   Patella mobility fair mobility, decreased mobility on Lt side.    Palpation comment noted bilateral muscle spasms in hamstring musculature with knee extension.      Ambulation/Gait   Ambulation/Gait Yes   Gait Comments amb into clinic with rw, able to amb short distance without rw, poor knee extension bilaterally.                    [facility] Adult PT Treatment/Exercise - 10/20/16 0001      Knee/Hip Exercises: Aerobic   Nustep L2 X5 min     Knee/Hip Exercises: Seated   Heel Slides AAROM;Both;1 set;10 reps   Heel Slides Limitations with endrange stretch     Knee/Hip Exercises: Supine   Quad Sets Strengthening;Both;1 set;10 reps   Heel Prop for Knee  Extension 5 minutes   Heel Prop for Knee Extension Limitations STM/TPR to hamstring musculature with overpressure as tolerated.     Knee/Hip Exercises: Prone   Prone Knee Hang 3 minutes   Prone Knee Hang Limitations towel rolls superior to patella     Modalities   Modalities Vasopneumatic     Vasopneumatic   Number Minutes Vasopneumatic  15 minutes   Vasopnuematic Location  Knee  right   Vasopneumatic Pressure Low   Vasopneumatic Temperature  3*                PT Education - 10/20/16 1510    Education provided Yes   Education Details HEP, emphasis on need for consistency with HEP, 3Xday. Reviewed knee extension and need for avoiding knee flexion for long periods of time.    Person(s) Educated Patient;Spouse   Methods Explanation;Demonstration;Tactile cues;Verbal cues;Handout   Comprehension Verbalized understanding;Returned demonstration             PT Long Term Goals - 10/20/16 1533      PT LONG TERM GOAL #1   Title Pt to be independent with HEP for strengthening, ROM, and balance. (12/01/16)   Time 6   Period Weeks   Status New     PT LONG TERM GOAL #2   Title Pt to have 0 degrees of Rt knee extension for gait stability. (12/01/16)   Time 6   Period Weeks   Status New     PT LONG TERM GOAL #3   Title Pt to have 0 degrees of Lt knee extension for gait stability. (12/01/16)   Time 6   Period Weeks   Status New     PT LONG TERM GOAL #4   Title Pt to ambulate without assistive device, community distances. (12/01/16)   Time 6   Period Weeks   Status New     PT LONG TERM GOAL #5   Title Pt to demonstrate Rt knee flexion to =/> 115 degrees for squatting tasks. (12/01/16)   Time 6   Period Weeks   Status New     Additional Long Term Goals   Additional Long Term Goals Yes     PT LONG TERM GOAL #6   Title Pt to demonstrate Lt knee flexion to =/> 115 degrees for squatting tasks. (12/01/16)               Plan - 10/20/16 1528  Clinical  Impression Statement Pt is presenting to PT services following bilateral TKA on 10/04/16. Currently the pt has significant deficits with bilateral knee ROM, strength, and functional mobility. Extended time was spent with the pt and his spouse to emphasize knee extension consistency at home. Overall the pt is appropriate for OPPT services.    Rehab Potential Good   PT Frequency 3x / week   PT Duration 6 weeks   PT Treatment/Interventions ADLs/Self Care Home Management;Cryotherapy;Electrical Stimulation;Iontophoresis 4mg /ml Dexamethasone;Moist Heat;Traction;Ultrasound;Gait training;Stair training;Therapeutic activities;Therapeutic exercise;Patient/family education;Manual techniques;Taping;Vasopneumatic Device   PT Next Visit Plan Review/modify HEP, focus on knee extension.    Consulted and Agree with Plan of Care Patient      Patient will benefit from skilled therapeutic intervention in order to improve the following deficits and impairments:  Abnormal gait, Decreased activity tolerance, Decreased balance, Decreased mobility, Decreased strength, Increased edema, Impaired flexibility, Pain, Increased muscle spasms, Decreased endurance, Decreased range of motion, Difficulty walking  Visit Diagnosis: Muscle weakness (generalized) - Plan: PT plan of care cert/re-cert  Stiffness of right knee, not elsewhere classified - Plan: PT plan of care cert/re-cert  Stiffness of left knee, not elsewhere classified - Plan: PT plan of care cert/re-cert  Other abnormalities of gait and mobility - Plan: PT plan of care cert/re-cert  Acute pain of both knees - Plan: PT plan of care cert/re-cert  Localized edema - Plan: PT plan of care cert/re-cert     Problem List Patient Active Problem List   Diagnosis Date Noted  . Drug-induced skin rash   . Rash and nonspecific skin eruption   . Heat rash   . Intermittent fever of unknown origin   . Post-operative pain   . Acute blood loss anemia   . Hypokalemia    . Constipation due to pain medication   . SIRS (systemic inflammatory response syndrome) (HCC)   . S/P TKR (total knee replacement), bilateral 10/07/2016  . Postoperative anemia due to acute blood loss 10/07/2016  . Leukocytosis 10/07/2016  . Primary osteoarthritis of knees, bilateral 10/07/2016  . OA (osteoarthritis) of knee 10/04/2016  . Primary osteoarthritis of both knees 01/14/2016    Linard Millers, PT, CSCS 10/20/2016, 3:58 PM  [facility] [place], [place], [address] Phone: [phone]   Fax:  [phone]  Name: Stephen Nixon MRN: [id number] Date of Birth: [birth year]

## 2016-10-23 ENCOUNTER — Ambulatory Visit (INDEPENDENT_AMBULATORY_CARE_PROVIDER_SITE_OTHER): Payer: Medicare Other | Admitting: Physical Therapy

## 2016-10-23 DIAGNOSIS — M25662 Stiffness of left knee, not elsewhere classified: Secondary | ICD-10-CM

## 2016-10-23 DIAGNOSIS — M25562 Pain in left knee: Secondary | ICD-10-CM

## 2016-10-23 DIAGNOSIS — R2689 Other abnormalities of gait and mobility: Secondary | ICD-10-CM

## 2016-10-23 DIAGNOSIS — R600 Localized edema: Secondary | ICD-10-CM | POA: Diagnosis not present

## 2016-10-23 DIAGNOSIS — M25561 Pain in right knee: Secondary | ICD-10-CM | POA: Diagnosis not present

## 2016-10-23 DIAGNOSIS — M25661 Stiffness of right knee, not elsewhere classified: Secondary | ICD-10-CM | POA: Diagnosis not present

## 2016-10-23 DIAGNOSIS — M6281 Muscle weakness (generalized): Secondary | ICD-10-CM | POA: Diagnosis present

## 2016-10-23 NOTE — Therapy (Signed)
[place], [place], [address] Phone: [phone]   Fax:  [phone]  Physical Therapy Treatment  Patient Details  Name: Stephen Nixon MRN: [id number] Date of Birth: September 08, 1941 Referring Provider: Delice Lesch, MD  Encounter Date: 10/23/2016      PT End of Session - 10/23/16 1440    Visit Number 2   Number of Visits 18   Date for PT Re-Evaluation 12/01/16   Authorization Time Period G-codes at 10th visit   PT Start Time [id number]   PT Stop Time 1532   PT Time Calculation (min) 56 min   Activity Tolerance Patient tolerated treatment well      Past Medical History:  Diagnosis Date  . Arthritis   . Closed fracture of left elbow with routine healing   . Hairy cell leukemia ([place]) 1990   due to agent orange.   . Impingement syndrome of left shoulder   . Measles   . Mumps     Past Surgical History:  Procedure Laterality Date  . 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 TONSILLECTOMY    . TOTAL KNEE ARTHROPLASTY Bilateral 10/04/2016   Procedure: BILATERAL TOTAL KNEE ARTHROPLASTY;  Surgeon: Gaynelle Arabian, MD;  Location: [facility];  Service: Orthopedics;  Laterality: Bilateral;    There were no vitals filed for this visit.      Subjective Assessment - 10/23/16 1440    Subjective "I'd like to get better faster".  Pt reports he wasn't able to do as many exercises as he would have liked since he had company over.    Currently in Pain? No/denies            [facility] PT Assessment - 10/23/16 0001      Assessment   Medical Diagnosis bilateral TKA   Referring Provider Delice Lesch, MD   Onset Date/Surgical Date 10/04/16   Next MD Visit 10/25/16     AROM   Right Knee Extension 11   Right Knee Flexion 111  AAROM   Left Knee Extension 8   Left Knee Flexion 107           [facility] Adult PT Treatment/Exercise - 10/23/16 0001      Knee/Hip Exercises: Aerobic   Nustep L4 x 6 min      Knee/Hip Exercises: Standing   Other Standing Knee Exercises sit to/from stand from elevated high/low table x 10 reps; VC and demo to improve form.      Knee/Hip Exercises: Supine   Heel Slides AAROM;Right;Left;1 set;10 reps     Knee/Hip Exercises: Prone   Hamstring Curl 1 set;10 reps   Prone Knee Hang 1 minute  2 reps   Prone Knee Hang Limitations TKE with toes curled under      Modalities   Modalities Vasopneumatic;Electrical Stimulation     Electrical Stimulation   Electrical Stimulation Location Rt/Lt knee    Electrical Stimulation Action ion repelling      Vasopneumatic   Number Minutes Vasopneumatic  15 minutes   Vasopnuematic Location  Knee  Rt   Vasopneumatic Pressure Low   Vasopneumatic Temperature  3*     Calf stretch 30 sec x 3 reps each leg.         PT Long Term Goals - 10/23/16 1520      PT LONG TERM GOAL #1   Title Pt to be independent with HEP for strengthening, ROM, and balance. (12/01/16)   Time 6  Period Weeks   Status On-going     PT LONG TERM GOAL #2   Title Pt to have 0 degrees of Rt knee extension for gait stability. (12/01/16)   Time 6   Period Weeks   Status On-going     PT LONG TERM GOAL #3   Title Pt to have 0 degrees of Lt knee extension for gait stability. (12/01/16)   Time 6   Period Weeks   Status On-going     PT LONG TERM GOAL #4   Title Pt to ambulate without assistive device, community distances. (12/01/16)   Time 6   Period Weeks   Status On-going     PT LONG TERM GOAL #5   Title Pt to demonstrate Rt knee flexion to =/> 115 degrees for squatting tasks. (12/01/16)   Time 6   Period Weeks   Status On-going     PT LONG TERM GOAL #6   Title Pt to demonstrate Lt knee flexion to =/> 115 degrees for squatting tasks. (12/01/16)   Time 6   Period Weeks   Status On-going               Plan - 10/23/16 1504    Clinical Impression Statement Pt demonstrated improved bilat knee ROM.  He tolerated all exercises  well, with minimal increase in pain.  Progressing towards goals.    Rehab Potential Good   PT Frequency 3x / week   PT Duration 6 weeks   PT Treatment/Interventions ADLs/Self Care Home Management;Cryotherapy;Electrical Stimulation;Iontophoresis 4mg /ml Dexamethasone;Moist Heat;Traction;Ultrasound;Gait training;Stair training;Therapeutic activities;Therapeutic exercise;Patient/family education;Manual techniques;Taping;Vasopneumatic Device   PT Next Visit Plan Review/modify HEP, focus on knee extension. Trial of rock tape for edema reduction.    Consulted and Agree with Plan of Care Patient      Patient will benefit from skilled therapeutic intervention in order to improve the following deficits and impairments:  Abnormal gait, Decreased activity tolerance, Decreased balance, Decreased mobility, Decreased strength, Increased edema, Impaired flexibility, Pain, Increased muscle spasms, Decreased endurance, Decreased range of motion, Difficulty walking  Visit Diagnosis: Muscle weakness (generalized)  Stiffness of right knee, not elsewhere classified  Stiffness of left knee, not elsewhere classified  Other abnormalities of gait and mobility  Acute pain of both knees  Localized edema     Problem List Patient Active Problem List   Diagnosis Date Noted  . Drug-induced skin rash   . Rash and nonspecific skin eruption   . Heat rash   . Intermittent fever of unknown origin   . Post-operative pain   . Acute blood loss anemia   . Hypokalemia   . Constipation due to pain medication   . SIRS (systemic inflammatory response syndrome) (HCC)   . S/P TKR (total knee replacement), bilateral 10/07/2016  . Postoperative anemia due to acute blood loss 10/07/2016  . Leukocytosis 10/07/2016  . Primary osteoarthritis of knees, bilateral 10/07/2016  . OA (osteoarthritis) of knee 10/04/2016  . Primary osteoarthritis of both knees 01/14/2016   Kerin Perna, PTA 10/23/16 3:28 PM  [facility] [place], [place], [address] Phone: [phone]   Fax:  [phone]  Name: Stephen Nixon MRN: [id number] Date of Birth: May 15, 1941

## 2016-10-24 ENCOUNTER — Encounter: Payer: Self-pay | Admitting: Physical Therapy

## 2016-10-24 ENCOUNTER — Ambulatory Visit (INDEPENDENT_AMBULATORY_CARE_PROVIDER_SITE_OTHER): Payer: Medicare Other | Admitting: Physical Therapy

## 2016-10-24 DIAGNOSIS — M6281 Muscle weakness (generalized): Secondary | ICD-10-CM

## 2016-10-24 DIAGNOSIS — M25661 Stiffness of right knee, not elsewhere classified: Secondary | ICD-10-CM

## 2016-10-24 DIAGNOSIS — M25562 Pain in left knee: Secondary | ICD-10-CM

## 2016-10-24 DIAGNOSIS — M25662 Stiffness of left knee, not elsewhere classified: Secondary | ICD-10-CM | POA: Diagnosis not present

## 2016-10-24 DIAGNOSIS — R600 Localized edema: Secondary | ICD-10-CM

## 2016-10-24 DIAGNOSIS — M25561 Pain in right knee: Secondary | ICD-10-CM | POA: Diagnosis not present

## 2016-10-24 DIAGNOSIS — R2689 Other abnormalities of gait and mobility: Secondary | ICD-10-CM

## 2016-10-24 NOTE — Therapy (Signed)
[place], [place], [address] Phone: [phone]   Fax:  [phone]  Physical Therapy Treatment  Patient Details  Name: Stephen Nixon MRN: [id number] Date of Birth: [birth year] Referring Provider: Delice Lesch, MD  Encounter Date: 10/24/2016      PT End of Session - 10/24/16 1254    Visit Number 3   Number of Visits 18   Date for PT Re-Evaluation 12/01/16   Authorization Time Period G-codes at 10th visit   PT Start Time 1103   PT Stop Time 1150   PT Time Calculation (min) 47 min   Activity Tolerance Patient tolerated treatment well   Behavior During Therapy [facility] for tasks assessed/performed      Past Medical History:  Diagnosis Date  . Arthritis   . Closed fracture of left elbow with routine healing   . Hairy cell leukemia ([place]) 1990   due to agent orange.   . Impingement syndrome of left shoulder   . Measles   . Mumps     Past Surgical History:  Procedure Laterality Date  . 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 TONSILLECTOMY    . TOTAL KNEE ARTHROPLASTY Bilateral 10/04/2016   Procedure: BILATERAL TOTAL KNEE ARTHROPLASTY;  Surgeon: Gaynelle Arabian, MD;  Location: [facility];  Service: Orthopedics;  Laterality: Bilateral;    There were no vitals filed for this visit.      Subjective Assessment - 10/24/16 1105    Subjective Knees are doing pretty good, really working on getting knees straight. Hoping to be able to change pain medication.    Pain Score 2    Pain Location Knee   Pain Orientation Right;Left   Pain Descriptors / Indicators Aching   Aggravating Factors  working on getting knees straight   Pain Relieving Factors medication, change postions            [facility] PT Assessment - 10/24/16 0001      AROM   Right Knee Flexion 117   Left Knee Flexion 114                     [facility] Adult PT Treatment/Exercise - 10/24/16 0001      Ambulation/Gait   Gait Comments using SPC for some ambulation short distances.      Knee/Hip Exercises: Stretches   Active Hamstring Stretch Both;3 reps;30 seconds     Knee/Hip Exercises: Aerobic   Nustep L4 x 6 min     Knee/Hip Exercises: Seated   Long Arc Quad Strengthening;Both;2 sets;10 reps   Long Arc Quad Limitations manual resistance   Knee/Hip Flexion seated knee flexion stretch, bilateral with overpressure   Hamstring Curl Strengthening;Both;2 sets;10 reps   Hamstring Limitations manual resistance     Knee/Hip Exercises: Supine   Other Supine Knee/Hip Exercises knee extension stretch bilaterally- STM to hamstrings      Knee/Hip Exercises: Prone   Hamstring Curl --  bilateral     Modalities   Modalities Cryotherapy;Vasopneumatic     Cryotherapy   Number Minutes Cryotherapy 15 Minutes   Cryotherapy Location Knee  left   Type of Cryotherapy Ice pack     Vasopneumatic   Number Minutes Vasopneumatic  15 minutes   Vasopnuematic Location  Knee  Rt   Vasopneumatic Pressure Low   Vasopneumatic Temperature  3*     Manual Therapy   Manual therapy comments STM to hamstring musculature, patellar sup/inf glides.  PT Education - 10/24/16 1253    Education provided Yes   Education Details reinforcing cont. work on Copy) Educated Patient   Methods Explanation   Comprehension Verbalized understanding             PT Long Term Goals - 10/23/16 1520      PT LONG TERM GOAL #1   Title Pt to be independent with HEP for strengthening, ROM, and balance. (12/01/16)   Time 6   Period Weeks   Status On-going     PT LONG TERM GOAL #2   Title Pt to have 0 degrees of Rt knee extension for gait stability. (12/01/16)   Time 6   Period Weeks   Status On-going     PT LONG TERM GOAL #3   Title Pt to have 0 degrees of Lt knee extension for gait stability. (12/01/16)   Time 6   Period Weeks   Status On-going     PT LONG TERM  GOAL #4   Title Pt to ambulate without assistive device, community distances. (12/01/16)   Time 6   Period Weeks   Status On-going     PT LONG TERM GOAL #5   Title Pt to demonstrate Rt knee flexion to =/> 115 degrees for squatting tasks. (12/01/16)   Time 6   Period Weeks   Status On-going     PT LONG TERM GOAL #6   Title Pt to demonstrate Lt knee flexion to =/> 115 degrees for squatting tasks. (12/01/16)   Time 6   Period Weeks   Status On-going               Plan - 10/24/16 1255    Clinical Impression Statement Pt making good progress with bilateral knee flexion. Knee extension is improving but continued focus attention needed to continue to make gains in this area.    Rehab Potential Good   PT Frequency 3x / week   PT Duration 6 weeks   PT Treatment/Interventions ADLs/Self Care Home Management;Cryotherapy;Electrical Stimulation;Iontophoresis 4mg /ml Dexamethasone;Moist Heat;Traction;Ultrasound;Gait training;Stair training;Therapeutic activities;Therapeutic exercise;Patient/family education;Manual techniques;Taping;Vasopneumatic Device   PT Next Visit Plan Review/modify HEP, focus on knee extension. Trial of rock tape for edema reduction.    Consulted and Agree with Plan of Care Patient      Patient will benefit from skilled therapeutic intervention in order to improve the following deficits and impairments:     Visit Diagnosis: Muscle weakness (generalized)  Stiffness of right knee, not elsewhere classified  Stiffness of left knee, not elsewhere classified  Acute pain of both knees  Other abnormalities of gait and mobility  Localized edema     Problem List Patient Active Problem List   Diagnosis Date Noted  . Drug-induced skin rash   . Rash and nonspecific skin eruption   . Heat rash   . Intermittent fever of unknown origin   . Post-operative pain   . Acute blood loss anemia   . Hypokalemia   . Constipation due to pain medication   . SIRS (systemic  inflammatory response syndrome) (HCC)   . S/P TKR (total knee replacement), bilateral 10/07/2016  . Postoperative anemia due to acute blood loss 10/07/2016  . Leukocytosis 10/07/2016  . Primary osteoarthritis of knees, bilateral 10/07/2016  . OA (osteoarthritis) of knee 10/04/2016  . Primary osteoarthritis of both knees 01/14/2016    Linard Millers, PT, CSCS 10/24/2016, 12:57 PM  [place] [id number]  [place], [place],  [place] Phone: [phone]   Fax:  [phone]  Name: Stephen Nixon MRN: [id number] Date of Birth: [birth year]

## 2016-10-25 ENCOUNTER — Encounter: Payer: Self-pay | Admitting: Physical Medicine & Rehabilitation

## 2016-10-25 ENCOUNTER — Encounter: Payer: Medicare Other | Attending: Physical Medicine & Rehabilitation | Admitting: Physical Medicine & Rehabilitation

## 2016-10-25 VITALS — BP 96/63 | HR 123 | Resp 16

## 2016-10-25 DIAGNOSIS — D62 Acute posthemorrhagic anemia: Secondary | ICD-10-CM | POA: Insufficient documentation

## 2016-10-25 DIAGNOSIS — R509 Fever, unspecified: Secondary | ICD-10-CM

## 2016-10-25 DIAGNOSIS — T402X5A Adverse effect of other opioids, initial encounter: Secondary | ICD-10-CM

## 2016-10-25 DIAGNOSIS — E876 Hypokalemia: Secondary | ICD-10-CM

## 2016-10-25 DIAGNOSIS — G8918 Other acute postprocedural pain: Secondary | ICD-10-CM

## 2016-10-25 DIAGNOSIS — Z96653 Presence of artificial knee joint, bilateral: Secondary | ICD-10-CM | POA: Diagnosis not present

## 2016-10-25 DIAGNOSIS — D72820 Lymphocytosis (symptomatic): Secondary | ICD-10-CM | POA: Diagnosis not present

## 2016-10-25 DIAGNOSIS — K5903 Drug induced constipation: Secondary | ICD-10-CM

## 2016-10-25 DIAGNOSIS — D72829 Elevated white blood cell count, unspecified: Secondary | ICD-10-CM | POA: Insufficient documentation

## 2016-10-25 DIAGNOSIS — M170 Bilateral primary osteoarthritis of knee: Secondary | ICD-10-CM | POA: Insufficient documentation

## 2016-10-25 DIAGNOSIS — R269 Unspecified abnormalities of gait and mobility: Secondary | ICD-10-CM

## 2016-10-25 DIAGNOSIS — R21 Rash and other nonspecific skin eruption: Secondary | ICD-10-CM

## 2016-10-25 MED ORDER — POLYETHYLENE GLYCOL 3350 17 G PO PACK: 17.0000 g | PACK | Freq: Every day | ORAL | 1 refills | Status: AC

## 2016-10-25 NOTE — Progress Notes (Signed)
Subjective:    Patient ID: Stephen Nixon, male    DOB: Apr 24, 1941, 75 y.o.   MRN: [id number]  HPI 75 year old male with bilateral knee OA with pain presents for transitional care management after receiving CIR for b/l TKR on 10/04/16. Admit date: 10/07/2016 Discharge date: 10/17/2016  At discharge, pt was instructed. He was been complaint with ashing his wounds and has not had an difficulties.  He was having fevers and chills, while in the hospital, but has not had any issues at home.  He continues to take coumadin and has been having his INR checked and is to d/c on 11/21 and then to resume ASA.  He is not driving. He has not had has his labs drawn, but has an appointment with his PCP.  He saw Ortho, who is pleased with the progress. Denies falls.  Pain is minimal.  He was started on Hydrocodone by Ortho.  He is constipated still.  His rash has improved.   Therapies: 3/week DME: None Mobility: Cane at all times.    Pain Inventory Average Pain 2 Pain Right Now 2 My pain is intermittent and aching  In the last 24 hours, has pain interfered with the following? General activity 4 Relation with others 4 Enjoyment of life 3 What TIME of day is your pain at its worst? night Sleep (in general) Poor  Pain is worse with: walking, bending, standing and some activites Pain improves with: heat/ice, therapy/exercise and medication Relief from Meds: 9  Mobility walk without assistance walk with assistance use a cane how many minutes can you walk? 10 ability to climb steps?  yes do you drive?  no  Function retired  Neuro/Psych trouble walking  Prior Studies hospital f/u  Physicians involved in your care Primary care Dr. Daryel Gerald Orthopedist Dr. Maureen Ralphs hospital f/u   Family History  Problem Relation Age of Onset  . Cancer - Other Mother   . Healthy Father    Social History   Social History  . Marital status: Married    Spouse name: N/A  . Number of children: N/A  .  Years of education: N/A   Social History Main Topics  . Smoking status: Former Smoker    Types: Cigarettes    Quit date: 09/18/1967  . Smokeless tobacco: Never Used  . Alcohol use 0.0 oz/week     Comment: occassionally  . Drug use: No  . Sexual activity: Not Asked   Other Topics Concern  . None   Social History Narrative  . None   Past Surgical History:  Procedure Laterality Date  . 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 TONSILLECTOMY    . TOTAL KNEE ARTHROPLASTY Bilateral 10/04/2016   Procedure: BILATERAL TOTAL KNEE ARTHROPLASTY;  Surgeon: Gaynelle Arabian, MD;  Location: [facility];  Service: Orthopedics;  Laterality: Bilateral;   Past Medical History:  Diagnosis Date  . Arthritis   . Closed fracture of left elbow with routine healing   . Hairy cell leukemia ([place]) 1990   due to agent orange.   . Impingement syndrome of left shoulder   . Measles   . Mumps    BP 96/63 (BP Location: Left Arm, Patient Position: Sitting, Cuff Size: Large)   Pulse (!) 123   Resp 16   SpO2 96%   Opioid Risk Score:   Fall Risk Score:  `1  Depression screen PHQ 2/9  Depression screen [facility] 2/9 10/25/2016 03/23/2016 01/13/2016 01/13/2016  Decreased Interest  0 0 0 0  Down, Depressed, Hopeless 0 0 0 0  PHQ - 2 Score 0 0 0 0  Altered sleeping 1 - - -  Tired, decreased energy 1 - - -  Change in appetite 1 - - -  Feeling bad or failure about yourself  0 - - -  Trouble concentrating 1 - - -  Moving slowly or fidgety/restless 0 - - -  Suicidal thoughts 0 - - -  PHQ-9 Score 4 - - -    Review of Systems  Constitutional: Negative.   HENT: Negative.   Eyes: Negative.   Respiratory: Negative.   Cardiovascular: Negative.   Gastrointestinal: Positive for constipation.  Endocrine: Negative.   Genitourinary: Negative.   Musculoskeletal: Positive for gait problem.  Skin: Negative.   Allergic/Immunologic: Negative.   Hematological: Negative.   Psychiatric/Behavioral: Negative.   All  other systems reviewed and are negative.     Objective:   Physical Exam Constitutional: He appears well-developed and well-nourished. NAD. Vital signs reviewed.  HENT: Normocephalic and atraumatic.  Eyes: EOMI. No discharge.  Cardiovascular: RRR. No JVD. Respiratory: Effort normal and breath sounds normal.  GI: Soft. Bowel sounds are normal. He exhibits no distension. There is no tenderness.  Musculoskeletal: He exhibits mild edema and no tenderness b/l knees..  AROM ~110 deg flexion Neurological: He is alert and oriented. B/l UE 5/5.  LE: Hip flexion 5/5, knee extension 5/5, 5/5 ADF/PF  Skin: Skin is warm and dry. He is not diaphoretic. No erythema surrounding knee incisions.  Rash improving, minimal Psychiatric: He has a normal mood and affect. His behavior is normal.     Assessment & Plan:  75 year old male with bilateral knee OA with pain presents for transitional care management after receiving CIR for b/l TKR on 10/04/16.  1.  OA of bilateral knees s/p bilateral TKA's.  Cont therapies  Cont follow up Ortho  Edema and ROM improving.  Cont anticoagulaiton per Ortho  2. Pain Management  Pt started on hydocodone by Orhto, but has allergy to Tylenol, pt monitor for side effects, per pt  3.  ABLA  Pt to follow up with PCP for routine lab work  4. Leukocytosis:   Pt states PCP to order labs to ensure improvement  5. Fevers: recurrent intermittent  Appears to have resolved, normal in office today, pt denies,  6. Hypokalemia  Pt to follow up with PCP for lab work, instructed pt to call office if blood work not ordered.   7. Opoid induced Constipation  Encouraged compliance with bowel meds, especially while taking narcotics  Increased bowel program  8. Rash  Improving, will wean medication  9. Gait abnormality  Cont cane for safety  Cont therapies  Meds reviewed Referrals reviewed All questions answered

## 2016-10-26 ENCOUNTER — Telehealth: Payer: Self-pay | Admitting: *Deleted

## 2016-10-26 ENCOUNTER — Ambulatory Visit (INDEPENDENT_AMBULATORY_CARE_PROVIDER_SITE_OTHER): Payer: Medicare Other | Admitting: Physical Therapy

## 2016-10-26 ENCOUNTER — Encounter: Payer: Self-pay | Admitting: Physical Therapy

## 2016-10-26 DIAGNOSIS — M25661 Stiffness of right knee, not elsewhere classified: Secondary | ICD-10-CM | POA: Diagnosis not present

## 2016-10-26 DIAGNOSIS — M25662 Stiffness of left knee, not elsewhere classified: Secondary | ICD-10-CM

## 2016-10-26 DIAGNOSIS — R600 Localized edema: Secondary | ICD-10-CM | POA: Diagnosis not present

## 2016-10-26 DIAGNOSIS — M25561 Pain in right knee: Secondary | ICD-10-CM | POA: Diagnosis not present

## 2016-10-26 DIAGNOSIS — R2689 Other abnormalities of gait and mobility: Secondary | ICD-10-CM

## 2016-10-26 DIAGNOSIS — M25562 Pain in left knee: Secondary | ICD-10-CM

## 2016-10-26 DIAGNOSIS — M6281 Muscle weakness (generalized): Secondary | ICD-10-CM | POA: Diagnosis present

## 2016-10-26 NOTE — Therapy (Addendum)
[facility] [place], [place], [address] Phone: [phone]   Fax:  [phone]  Physical Therapy Treatment  Patient Details  Name: Stephen Nixon MRN: [id number] Date of Birth: 08-12-1941 Referring Provider: Delice Lesch, MD  Encounter Date: 10/26/2016      PT End of Session - 10/26/16 1334    Visit Number 4   Number of Visits 18   Date for PT Re-Evaluation 12/01/16   Authorization Time Period G-codes at 10th visit   PT Start Time 1101   PT Stop Time 1159   PT Time Calculation (min) 58 min   Activity Tolerance Patient tolerated treatment well   Behavior During Therapy [facility] for tasks assessed/performed      Past Medical History:  Diagnosis Date  . Arthritis   . Closed fracture of left elbow with routine healing   . Hairy cell leukemia ([place]) 1990   due to agent orange.   . Impingement syndrome of left shoulder   . Measles   . Mumps     Past Surgical History:  Procedure Laterality Date  . 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 TONSILLECTOMY    . TOTAL KNEE ARTHROPLASTY Bilateral 10/04/2016   Procedure: BILATERAL TOTAL KNEE ARTHROPLASTY;  Surgeon: Gaynelle Arabian, MD;  Location: [facility];  Service: Orthopedics;  Laterality: Bilateral;    There were no vitals filed for this visit.      Subjective Assessment - 10/26/16 1102    Subjective Went to the doctor yesterday, everything is looking great. The actual knees are doing pretty.    Currently in Pain? Yes   Pain Score 2    Pain Location Knee   Pain Orientation Right;Left   Pain Descriptors / Indicators Aching   Aggravating Factors  working on getting the knees straight   Pain Relieving Factors moving/changing positions            [facility] PT Assessment - 10/26/16 0001      AROM   Right Knee Extension 5  with overpressure   Left Knee Extension 7  with manual overpressure                     [facility] Adult PT  Treatment/Exercise - 10/26/16 0001      Knee/Hip Exercises: Stretches   Passive Hamstring Stretch 3 reps;30 seconds;Both   Passive Hamstring Stretch Limitations contract relax     Knee/Hip Exercises: Aerobic   Nustep L4 x 6 min     Knee/Hip Exercises: Seated   Long Arc Quad Strengthening;Both;10 reps;1 set   [employer] Limitations manual resistance   Heel Slides AAROM;Both;1 set;10 reps   Hamstring Curl Strengthening;Both;10 reps;1 set   Hamstring Limitations manual resistance     Knee/Hip Exercises: Supine   Heel Slides AAROM;Right;Left;1 set;10 reps  with endrange stretch   Other Supine Knee/Hip Exercises knee extension stretch bilaterally- manual overpressure     Cryotherapy   Cryotherapy Location Knee  left     Vasopneumatic   Number Minutes Vasopneumatic  15 minutes   Vasopnuematic Location  Knee  Rt   Vasopneumatic Pressure Low   Vasopneumatic Temperature  3*     Manual Therapy   Manual Therapy Taping   Manual therapy comments STM to hamstring musculature, patellar sup/inf glides.    Kinesiotex Edema     Kinesiotix   Edema 2 fan strips of Rock tape applied to each anterior knee to assist in edema  reduction (and scar management)                      PT Long Term Goals - 10/23/16 1520      PT LONG TERM GOAL #1   Title Pt to be independent with HEP for strengthening, ROM, and balance. (12/01/16)   Time 6   Period Weeks   Status On-going     PT LONG TERM GOAL #2   Title Pt to have 0 degrees of Rt knee extension for gait stability. (12/01/16)   Time 6   Period Weeks   Status On-going     PT LONG TERM GOAL #3   Title Pt to have 0 degrees of Lt knee extension for gait stability. (12/01/16)   Time 6   Period Weeks   Status On-going     PT LONG TERM GOAL #4   Title Pt to ambulate without assistive device, community distances. (12/01/16)   Time 6   Period Weeks   Status On-going     PT LONG TERM GOAL #5   Title Pt to demonstrate Rt knee  flexion to =/> 115 degrees for squatting tasks. (12/01/16)   Time 6   Period Weeks   Status On-going     PT LONG TERM GOAL #6   Title Pt to demonstrate Lt knee flexion to =/> 115 degrees for squatting tasks. (12/01/16)   Time 6   Period Weeks   Status On-going               Plan - 10/26/16 1107    Clinical Impression Statement Pt continues to improve, states that he is moving around better now, more steady on his feet. Gradually improving with knee extension but continues to require significant focus during PT sessions.    Rehab Potential Good   PT Frequency 3x / week   PT Duration 6 weeks   PT Treatment/Interventions ADLs/Self Care Home Management;Cryotherapy;Electrical Stimulation;Iontophoresis 4mg /ml Dexamethasone;Moist Heat;Traction;Ultrasound;Gait training;Stair training;Therapeutic activities;Therapeutic exercise;Patient/family education;Manual techniques;Taping;Vasopneumatic Device      Patient will benefit from skilled therapeutic intervention in order to improve the following deficits and impairments:     Visit Diagnosis: Muscle weakness (generalized)  Stiffness of right knee, not elsewhere classified  Stiffness of left knee, not elsewhere classified  Acute pain of both knees  Other abnormalities of gait and mobility  Localized edema     Problem List Patient Active Problem List   Diagnosis Date Noted  . Drug-induced skin rash   . Rash and nonspecific skin eruption   . Heat rash   . Intermittent fever of unknown origin   . Post-operative pain   . Acute blood loss anemia   . Hypokalemia   . Constipation due to pain medication   . SIRS (systemic inflammatory response syndrome) (HCC)   . S/P TKR (total knee replacement), bilateral 10/07/2016  . Postoperative anemia due to acute blood loss 10/07/2016  . Leukocytosis 10/07/2016  . Primary osteoarthritis of knees, bilateral 10/07/2016  . OA (osteoarthritis) of knee 10/04/2016  . Primary osteoarthritis  of both knees 01/14/2016    Linard Millers PT, CSCS 10/26/2016, 5:34 PM   [facility] [address], [place], [address] Phone: [phone]   Fax:  [phone]  Name: Stephen Nixon MRN: [id number] Date of Birth: 06-May-1941

## 2016-10-26 NOTE — Telephone Encounter (Signed)
He can have another 30 tabs, but should be weaned to BID for 2 weeks, then daily for 2 weeks, then d/c.  Thanks

## 2016-10-26 NOTE — Telephone Encounter (Signed)
Calling for a refill on cyclobenzaprine.  It was just ordered 10/17/16 #30 but sig is tid by Pam.  Do you want to refill with  again? He was just here yesterday.

## 2016-10-27 MED ORDER — CYCLOBENZAPRINE HCL 5 MG PO TABS: ORAL_TABLET | ORAL | 0 refills | Status: DC

## 2016-10-27 NOTE — Telephone Encounter (Signed)
Sent to pharmacy 

## 2016-10-30 ENCOUNTER — Ambulatory Visit (INDEPENDENT_AMBULATORY_CARE_PROVIDER_SITE_OTHER): Payer: Medicare Other | Admitting: Physical Therapy

## 2016-10-30 DIAGNOSIS — M6281 Muscle weakness (generalized): Secondary | ICD-10-CM | POA: Diagnosis not present

## 2016-10-30 DIAGNOSIS — M25661 Stiffness of right knee, not elsewhere classified: Secondary | ICD-10-CM

## 2016-10-30 DIAGNOSIS — M25562 Pain in left knee: Secondary | ICD-10-CM

## 2016-10-30 DIAGNOSIS — M25561 Pain in right knee: Secondary | ICD-10-CM

## 2016-10-30 DIAGNOSIS — M25662 Stiffness of left knee, not elsewhere classified: Secondary | ICD-10-CM

## 2016-10-30 NOTE — Therapy (Signed)
[place], [place], [address] Phone: [phone]   Fax:  [phone]  Physical Therapy Treatment  Patient Details  Name: Stephen Nixon MRN: [id number] Date of Birth: 22-Nov-1941 Referring Provider: Dr. Delice Lesch   Encounter Date: 10/30/2016      PT End of Session - 10/30/16 1440    Visit Number 5   Number of Visits 18   Date for PT Re-Evaluation 12/01/16   Authorization Time Period G-codes at 10th visit   PT Start Time [id number]   PT Stop Time 1535   PT Time Calculation (min) 61 min   Activity Tolerance Patient tolerated treatment well      Past Medical History:  Diagnosis Date  . Arthritis   . Closed fracture of left elbow with routine healing   . Hairy cell leukemia ([place]) 1990   due to agent orange.   . Impingement syndrome of left shoulder   . Measles   . Mumps     Past Surgical History:  Procedure Laterality Date  . 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 TONSILLECTOMY    . TOTAL KNEE ARTHROPLASTY Bilateral 10/04/2016   Procedure: BILATERAL TOTAL KNEE ARTHROPLASTY;  Surgeon: Gaynelle Arabian, MD;  Location: [facility];  Service: Orthopedics;  Laterality: Bilateral;    There were no vitals filed for this visit.      Subjective Assessment - 10/30/16 1440    Subjective Pt reports he is not doing as much exercise at home as he thought he would.  The pain medication makes him "dopey" and sleepy.  He is interested in weaning off the medication.    Currently in Pain? Yes   Pain Score 2    Pain Location Knee   Pain Orientation Left   Pain Descriptors / Indicators Aching   Aggravating Factors  walking too much   Pain Relieving Factors  rest in a bent position.    Multiple Pain Sites No            [facility] PT Assessment - 10/30/16 0001      Assessment   Medical Diagnosis bilateral TKA   Referring Provider Dr. Delice Lesch    Onset Date/Surgical Date 10/04/16   Next MD  Visit 11/14/16   Prior Therapy none     AROM   Right/Left Knee --  flexion with heel slide, AAROM   Right Knee Extension 10   Right Knee Flexion 117   Left Knee Extension 7   Left Knee Flexion 115            [facility] Adult PT Treatment/Exercise - 10/30/16 0001      Knee/Hip Exercises: Stretches   Gastroc Stretch Right;Left;2 reps;30 seconds     Knee/Hip Exercises: Aerobic   Nustep L5 x 5 min     Knee/Hip Exercises: Standing   Lateral Step Up Right;Left;1 set;10 reps;Hand Hold: 2;Step Height: 6"   Forward Step Up Right;Left;1 set;15 reps;Hand Hold: 2;Step Height: 2";Step Height: 6"   Other Standing Knee Exercises Lt/Rt TKE with green band behind knee x 5 sec hold x 10 each leg. Heel raises x 20 reps bilat      Knee/Hip Exercises: Seated   Other Seated Knee/Hip Exercises seated scoot: 5 reps and held 15 sec.      Knee/Hip Exercises: Supine   Heel Slides AAROM;Right;Left;1 set;10 reps  with endrange stretch  Quad sets bilat x 10 sec x 5 reps  Cryotherapy   Cryotherapy Location Knee  Left - ice pack     Electrical Stimulation   Electrical Stimulation Location Rt/Lt knee    Electrical Stimulation Action pre mod to each area   Electrical Stimulation Parameters to tolerance    Electrical Stimulation Goals Pain     Vasopneumatic   Number Minutes Vasopneumatic  15 minutes   Vasopnuematic Location  Knee  Rt   Vasopneumatic Pressure Low   Vasopneumatic Temperature  3*     Manual Therapy   Manual Therapy Passive ROM;Soft tissue mobilization  tape held so pt can exfoliate knees.    Soft tissue mobilization to tissue surround incision   Passive ROM Rt / Lt knee into ext with foot up on bolster                      PT Long Term Goals - 10/23/16 1520      PT LONG TERM GOAL #1   Title Pt to be independent with HEP for strengthening, ROM, and balance. (12/01/16)   Time 6   Period Weeks   Status On-going     PT LONG TERM GOAL #2   Title Pt to have 0  degrees of Rt knee extension for gait stability. (12/01/16)   Time 6   Period Weeks   Status On-going     PT LONG TERM GOAL #3   Title Pt to have 0 degrees of Lt knee extension for gait stability. (12/01/16)   Time 6   Period Weeks   Status On-going     PT LONG TERM GOAL #4   Title Pt to ambulate without assistive device, community distances. (12/01/16)   Time 6   Period Weeks   Status On-going     PT LONG TERM GOAL #5   Title Pt to demonstrate Rt knee flexion to =/> 115 degrees for squatting tasks. (12/01/16)   Time 6   Period Weeks   Status On-going     PT LONG TERM GOAL #6   Title Pt to demonstrate Lt knee flexion to =/> 115 degrees for squatting tasks. (12/01/16)   Time 6   Period Weeks   Status On-going               Plan - 10/30/16 1700    Clinical Impression Statement Pt's bilat knee motion is similar to last visit. He tolerated new standing exercises well, with minimal increase in pain.  He is making gradual progress towards established goals.    Rehab Potential Good   PT Frequency 3x / week   PT Treatment/Interventions ADLs/Self Care Home Management;Cryotherapy;Electrical Stimulation;Iontophoresis 4mg /ml Dexamethasone;Moist Heat;Traction;Ultrasound;Gait training;Stair training;Therapeutic activities;Therapeutic exercise;Patient/family education;Manual techniques;Taping;Vasopneumatic Device   PT Next Visit Plan retape for edema reduction.  continue progressive strengthening/ROM for bilat knees.       Patient will benefit from skilled therapeutic intervention in order to improve the following deficits and impairments:  Abnormal gait, Decreased activity tolerance, Decreased balance, Decreased mobility, Decreased strength, Increased edema, Impaired flexibility, Pain, Increased muscle spasms, Decreased endurance, Decreased range of motion, Difficulty walking  Visit Diagnosis: No diagnosis found.     Problem List Patient Active Problem List   Diagnosis Date  Noted  . Drug-induced skin rash   . Rash and nonspecific skin eruption   . Heat rash   . Intermittent fever of unknown origin   . Post-operative pain   . Acute blood loss anemia   . Hypokalemia   . Constipation due to pain  medication   . SIRS (systemic inflammatory response syndrome) (HCC)   . S/P TKR (total knee replacement), bilateral 10/07/2016  . Postoperative anemia due to acute blood loss 10/07/2016  . Leukocytosis 10/07/2016  . Primary osteoarthritis of knees, bilateral 10/07/2016  . OA (osteoarthritis) of knee 10/04/2016  . Primary osteoarthritis of both knees 01/14/2016   Kerin Perna, PTA 10/30/16 5:04 PM  [place], [place], [address] Phone: [phone]   Fax:  [phone]  Name: Stephen Nixon MRN: [id number] Date of Birth: 1941-08-03

## 2016-10-31 ENCOUNTER — Ambulatory Visit (INDEPENDENT_AMBULATORY_CARE_PROVIDER_SITE_OTHER): Payer: Medicare Other | Admitting: Physical Therapy

## 2016-10-31 DIAGNOSIS — M25662 Stiffness of left knee, not elsewhere classified: Secondary | ICD-10-CM

## 2016-10-31 DIAGNOSIS — M25661 Stiffness of right knee, not elsewhere classified: Secondary | ICD-10-CM | POA: Diagnosis not present

## 2016-10-31 DIAGNOSIS — M6281 Muscle weakness (generalized): Secondary | ICD-10-CM | POA: Diagnosis present

## 2016-10-31 DIAGNOSIS — M25561 Pain in right knee: Secondary | ICD-10-CM | POA: Diagnosis not present

## 2016-10-31 DIAGNOSIS — M25562 Pain in left knee: Secondary | ICD-10-CM | POA: Diagnosis not present

## 2016-10-31 NOTE — Therapy (Signed)
[place], [place], [address] Phone: [phone]   Fax:  [phone]  Physical Therapy Treatment  Patient Details  Name: Stephen Nixon MRN: [id number] Date of Birth: 11-Jun-1941 Referring Provider: Dr. Delice Lesch   Encounter Date: 10/31/2016      PT End of Session - 10/31/16 0933    Visit Number 6   Number of Visits 18   Date for PT Re-Evaluation 12/01/16   Authorization Time Period G-codes at 10th visit   PT Start Time 0927   PT Stop Time 1020   PT Time Calculation (min) 53 min   Activity Tolerance Patient tolerated treatment well   Behavior During Therapy [facility] for tasks assessed/performed      Past Medical History:  Diagnosis Date  . Arthritis   . Closed fracture of left elbow with routine healing   . Hairy cell leukemia ([place]) 1990   due to agent orange.   . Impingement syndrome of left shoulder   . Measles   . Mumps     Past Surgical History:  Procedure Laterality Date  . 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 TONSILLECTOMY    . TOTAL KNEE ARTHROPLASTY Bilateral 10/04/2016   Procedure: BILATERAL TOTAL KNEE ARTHROPLASTY;  Surgeon: Gaynelle Arabian, MD;  Location: [facility];  Service: Orthopedics;  Laterality: Bilateral;    There were no vitals filed for this visit.      Subjective Assessment - 10/31/16 0933    Subjective Pt states he is now off of pain medicine, just taking Tramadol.  He feels less "dopey".  He is still waking in night due to pain.    Currently in Pain? No/denies   Pain Score 0-No pain            [facility] PT Assessment - 10/31/16 0001      Assessment   Medical Diagnosis bilateral TKA   Referring Provider Dr. Delice Lesch    Onset Date/Surgical Date 10/04/16   Next MD Visit 11/14/16   Prior Therapy none           [facility] Adult PT Treatment/Exercise - 10/31/16 0001      Knee/Hip Exercises: Stretches   Quad Stretch Right;Left;3 reps;30  seconds   Gastroc Stretch Right;Left;2 reps;30 seconds     Knee/Hip Exercises: Aerobic   Nustep L4: 6 min      Knee/Hip Exercises: Standing   Heel Raises Both;1 set;10 reps   Lateral Step Up Right;Left;1 set;10 reps;Hand Hold: 2;Step Height: 6"   Forward Step Up Right;Left;1 set;10 reps;Hand Hold: 2;Step Height: 6"   SLS multiple reps each leg: up to 8 sec LLE, 4 sec RLE.    Other Standing Knee Exercises tandem stance 2 reps each leg, up to 10-15 sec      Knee/Hip Exercises: Seated   Sit to Sand 1 set;10 reps;without UE support  to low black mat     Knee/Hip Exercises: Prone   Hamstring Curl 2 sets;5 reps  between prone hang reps   Prone Knee Hang 1 minute  3 reps     Modalities   Modalities Moist Heat;Cryotherapy;Electrical Stimulation     Moist Heat Therapy   Number Minutes Moist Heat 15 Minutes   Moist Heat Location Knee  Rt/Lt posterior     Cryotherapy   Number Minutes Cryotherapy 15 Minutes   Cryotherapy Location Knee  left/right anterior   Type of Cryotherapy Ice pack     Electrical Stimulation  Electrical Stimulation Location Rt/Lt knee    Printmaker Action pre mod to each area    Electrical Stimulation Parameters to tolerance    Electrical Stimulation Goals Pain     Vasopneumatic   Number Minutes Vasopneumatic  --   Vasopnuematic Location  --   Vasopneumatic Pressure --   Vasopneumatic Temperature  --     Manual Therapy   Kinesiotex Edema     Kinesiotix   Edema 2 fan strips of Rock tape applied to each anterior knee to assist in edema reduction (and scar management)                      PT Long Term Goals - 10/23/16 1520      PT LONG TERM GOAL #1   Title Pt to be independent with HEP for strengthening, ROM, and balance. (12/01/16)   Time 6   Period Weeks   Status On-going     PT LONG TERM GOAL #2   Title Pt to have 0 degrees of Rt knee extension for gait stability. (12/01/16)   Time 6   Period Weeks   Status  On-going     PT LONG TERM GOAL #3   Title Pt to have 0 degrees of Lt knee extension for gait stability. (12/01/16)   Time 6   Period Weeks   Status On-going     PT LONG TERM GOAL #4   Title Pt to ambulate without assistive device, community distances. (12/01/16)   Time 6   Period Weeks   Status On-going     PT LONG TERM GOAL #5   Title Pt to demonstrate Rt knee flexion to =/> 115 degrees for squatting tasks. (12/01/16)   Time 6   Period Weeks   Status On-going     PT LONG TERM GOAL #6   Title Pt to demonstrate Lt knee flexion to =/> 115 degrees for squatting tasks. (12/01/16)   Time 6   Period Weeks   Status On-going               Plan - 10/31/16 0953    Clinical Impression Statement Pt tolerated all exercises well.  He was a little more stiff in Lt knee today.  He demonstrated decreased balance with SLS and tandem stance; added this to his HEP.    Rehab Potential Good   PT Frequency 3x / week   PT Duration 6 weeks   PT Treatment/Interventions ADLs/Self Care Home Management;Cryotherapy;Electrical Stimulation;Iontophoresis 4mg /ml Dexamethasone;Moist Heat;Traction;Ultrasound;Gait training;Stair training;Therapeutic activities;Therapeutic exercise;Patient/family education;Manual techniques;Taping;Vasopneumatic Device   PT Next Visit Plan continue progressive strengthening/ROM for bilat knees.    Consulted and Agree with Plan of Care Patient      Patient will benefit from skilled therapeutic intervention in order to improve the following deficits and impairments:  Abnormal gait, Decreased activity tolerance, Decreased balance, Decreased mobility, Decreased strength, Increased edema, Impaired flexibility, Pain, Increased muscle spasms, Decreased endurance, Decreased range of motion, Difficulty walking  Visit Diagnosis: Muscle weakness (generalized)  Stiffness of right knee, not elsewhere classified  Stiffness of left knee, not elsewhere classified  Acute pain of both  knees     Problem List Patient Active Problem List   Diagnosis Date Noted  . Drug-induced skin rash   . Rash and nonspecific skin eruption   . Heat rash   . Intermittent fever of unknown origin   . Post-operative pain   . Acute blood loss anemia   . Hypokalemia   .  Constipation due to pain medication   . SIRS (systemic inflammatory response syndrome) (HCC)   . S/P TKR (total knee replacement), bilateral 10/07/2016  . Postoperative anemia due to acute blood loss 10/07/2016  . Leukocytosis 10/07/2016  . Primary osteoarthritis of knees, bilateral 10/07/2016  . OA (osteoarthritis) of knee 10/04/2016  . Primary osteoarthritis of both knees 01/14/2016   Kerin Perna, PTA 10/31/16 10:14 AM  [place], [place], [address] Phone: [phone]   Fax:  [phone]  Name: Stephen Nixon MRN: [id number] Date of Birth: [birth year]

## 2016-11-01 ENCOUNTER — Encounter: Payer: Self-pay | Admitting: Rehabilitative and Restorative Service Providers"

## 2016-11-01 ENCOUNTER — Ambulatory Visit (INDEPENDENT_AMBULATORY_CARE_PROVIDER_SITE_OTHER): Payer: Medicare Other | Admitting: Rehabilitative and Restorative Service Providers"

## 2016-11-01 DIAGNOSIS — R2689 Other abnormalities of gait and mobility: Secondary | ICD-10-CM | POA: Diagnosis not present

## 2016-11-01 DIAGNOSIS — M6281 Muscle weakness (generalized): Secondary | ICD-10-CM

## 2016-11-01 DIAGNOSIS — M25561 Pain in right knee: Secondary | ICD-10-CM | POA: Diagnosis not present

## 2016-11-01 DIAGNOSIS — M25662 Stiffness of left knee, not elsewhere classified: Secondary | ICD-10-CM

## 2016-11-01 DIAGNOSIS — M25661 Stiffness of right knee, not elsewhere classified: Secondary | ICD-10-CM

## 2016-11-01 DIAGNOSIS — M25562 Pain in left knee: Secondary | ICD-10-CM

## 2016-11-01 NOTE — Patient Instructions (Addendum)
HIP: Hamstrings - Supine   Place strap around foot. Raise leg up, keeping knee straight.  Bend opposite knee to protect back if indicated. Hold 30 seconds. 3 reps per set, 2-3 sets per day   Standing with back to wall - work on straightening knees   Outer Hip Stretch: Reclined IT Band Stretch (Strap)   Strap around one foot, pull leg across body until you feel a pull or stretch, with shoulders on mat. Hold for 30 seconds. Repeat 3 times each leg. 2-3 times/day.

## 2016-11-01 NOTE — Therapy (Signed)
[place], [place], [address] Phone: [phone]   Fax:  [phone]  Physical Therapy Treatment  Patient Details  Name: Stephen Nixon MRN: [id number] Date of Birth: 04-09-1941 Referring Provider: Dr. Delice Lesch   Encounter Date: 11/01/2016      PT End of Session - 11/01/16 1102    Visit Number 7   Number of Visits 18   Date for PT Re-Evaluation 12/01/16   PT Start Time 1100   PT Stop Time 1152   PT Time Calculation (min) 52 min   Activity Tolerance Patient tolerated treatment well      Past Medical History:  Diagnosis Date  . Arthritis   . Closed fracture of left elbow with routine healing   . Hairy cell leukemia ([place]) 1990   due to agent orange.   . Impingement syndrome of left shoulder   . Measles   . Mumps     Past Surgical History:  Procedure Laterality Date  . 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 TONSILLECTOMY    . TOTAL KNEE ARTHROPLASTY Bilateral 10/04/2016   Procedure: BILATERAL TOTAL KNEE ARTHROPLASTY;  Surgeon: Gaynelle Arabian, MD;  Location: [facility];  Service: Orthopedics;  Laterality: Bilateral;    There were no vitals filed for this visit.      Subjective Assessment - 11/01/16 1104    Subjective Feels he may be geting a cold and does not want to have ice after treatment. Needs to work on straightening knees - "that seems to be the thing that isn't getting better"   Currently in Pain? Yes   Pain Score 3    Pain Location Knee   Pain Orientation Left   Pain Descriptors / Indicators Aching   Pain Score 0   Pain Location Knee   Pain Orientation Right                         [facility] Adult PT Treatment/Exercise - 11/01/16 0001      Neuro Re-ed    Neuro Re-ed Details  working on hip and knee extension in standing      Knee/Hip Exercises: Stretches   Passive Hamstring Stretch Left;Both;3 reps;30 seconds     Knee/Hip Exercises:  Aerobic   Nustep L5: 6 min      Knee/Hip Exercises: Standing   Other Standing Knee Exercises standing w/back at wall working on hip/knee extension 3-5 sec hold x 10; terminal knee extension w/ blue TB supported with UE's back no longer supported x 10 each LE     Knee/Hip Exercises: Supine   Quad Sets --  quad set w/ LE's resting on bolster 10 sec x 10   Other Supine Knee/Hip Exercises knee extension stretch bilaterally ankles resting on foam roll- manual overpressure     Moist Heat Therapy   Number Minutes Moist Heat 12 Minutes   Moist Heat Location Knee  Rt/Lt posterior prior to extension work     Cryotherapy   Number Minutes Cryotherapy --  patient declined ice      Manual Therapy   Passive ROM Rt / Lt knee into ext with foot up on bolster                 PT Education - 11/01/16 1119    Education provided Yes   Education Details HEP   Person(s) Educated Patient   Methods Explanation;Tactile cues;Demonstration;Verbal cues;Handout   Comprehension  Verbalized understanding;Verbal cues required;Tactile cues required             PT Long Term Goals - 11/01/16 1115      PT LONG TERM GOAL #1   Title Pt to be independent with HEP for strengthening, ROM, and balance. (12/01/16)   Time 6   Period Weeks   Status On-going     PT LONG TERM GOAL #2   Title Pt to have 0 degrees of Rt knee extension for gait stability. (12/01/16)   Time 6   Period Weeks   Status On-going     PT LONG TERM GOAL #3   Title Pt to have 0 degrees of Lt knee extension for gait stability. (12/01/16)   Time 6   Period Weeks   Status On-going     PT LONG TERM GOAL #4   Title Pt to ambulate without assistive device, community distances. (12/01/16)   Time 6   Period Weeks   Status On-going     PT LONG TERM GOAL #5   Title Pt to demonstrate Rt knee flexion to =/> 115 degrees for squatting tasks. (12/01/16)   Time 6   Period Weeks   Status On-going     PT LONG TERM GOAL #6   Title Pt  to demonstrate Lt knee flexion to =/> 115 degrees for squatting tasks. (12/01/16)   Time 6   Period Weeks   Status On-going               Plan - 11/01/16 1113    Clinical Impression Statement Progressing well. tolerated exercise with mimimal difficulty with focus on knee extension. Continues to progress toward stated goals of therapy.   Rehab Potential Good   PT Frequency 3x / week   PT Duration 6 weeks   PT Treatment/Interventions ADLs/Self Care Home Management;Cryotherapy;Electrical Stimulation;Iontophoresis 4mg /ml Dexamethasone;Moist Heat;Traction;Ultrasound;Gait training;Stair training;Therapeutic activities;Therapeutic exercise;Patient/family education;Manual techniques;Taping;Vasopneumatic Device   PT Next Visit Plan continue progressive strengthening/ROM for bilat knees.    Consulted and Agree with Plan of Care Patient      Patient will benefit from skilled therapeutic intervention in order to improve the following deficits and impairments:  Abnormal gait, Decreased activity tolerance, Decreased balance, Decreased mobility, Decreased strength, Increased edema, Impaired flexibility, Pain, Increased muscle spasms, Decreased endurance, Decreased range of motion, Difficulty walking  Visit Diagnosis: Muscle weakness (generalized)  Stiffness of right knee, not elsewhere classified  Stiffness of left knee, not elsewhere classified  Acute pain of both knees  Other abnormalities of gait and mobility     Problem List Patient Active Problem List   Diagnosis Date Noted  . Drug-induced skin rash   . Rash and nonspecific skin eruption   . Heat rash   . Intermittent fever of unknown origin   . Post-operative pain   . Acute blood loss anemia   . Hypokalemia   . Constipation due to pain medication   . SIRS (systemic inflammatory response syndrome) (HCC)   . S/P TKR (total knee replacement), bilateral 10/07/2016  . Postoperative anemia due to acute blood loss 10/07/2016  .  Leukocytosis 10/07/2016  . Primary osteoarthritis of knees, bilateral 10/07/2016  . OA (osteoarthritis) of knee 10/04/2016  . Primary osteoarthritis of both knees 01/14/2016    Stephen Nixon [name] PT, MPH 11/01/2016, 11:59 AM  [facility] [place], [place], [address] Phone: [phone]   Fax:  [phone]  Name: Stephen Nixon MRN: [id number] Date of Birth: [birth year]

## 2016-11-06 ENCOUNTER — Encounter: Payer: Self-pay | Admitting: Physical Therapy

## 2016-11-06 ENCOUNTER — Ambulatory Visit (INDEPENDENT_AMBULATORY_CARE_PROVIDER_SITE_OTHER): Payer: Medicare Other | Admitting: Physical Therapy

## 2016-11-06 DIAGNOSIS — M25662 Stiffness of left knee, not elsewhere classified: Secondary | ICD-10-CM

## 2016-11-06 DIAGNOSIS — M6281 Muscle weakness (generalized): Secondary | ICD-10-CM | POA: Diagnosis not present

## 2016-11-06 DIAGNOSIS — R2689 Other abnormalities of gait and mobility: Secondary | ICD-10-CM

## 2016-11-06 DIAGNOSIS — M25661 Stiffness of right knee, not elsewhere classified: Secondary | ICD-10-CM | POA: Diagnosis not present

## 2016-11-06 NOTE — Therapy (Signed)
[place], [place], [address] Phone: [phone]   Fax:  [phone]  Physical Therapy Treatment  Patient Details  Name: Stephen Nixon MRN: [id number] Date of Birth: 1941-06-04 Referring Provider: Dr. Delice Lesch   Encounter Date: 11/06/2016      PT End of Session - 11/06/16 1259    Visit Number 8   Number of Visits 18   Date for PT Re-Evaluation 12/01/16   Authorization Time Period G-codes at 10th visit   PT Start Time 1100   PT Stop Time 1145   PT Time Calculation (min) 45 min   Activity Tolerance Patient tolerated treatment well   Behavior During Therapy [facility] for tasks assessed/performed      Past Medical History:  Diagnosis Date  . Arthritis   . Closed fracture of left elbow with routine healing   . Hairy cell leukemia ([place]) 1990   due to agent orange.   . Impingement syndrome of left shoulder   . Measles   . Mumps     Past Surgical History:  Procedure Laterality Date  . 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 TONSILLECTOMY    . TOTAL KNEE ARTHROPLASTY Bilateral 10/04/2016   Procedure: BILATERAL TOTAL KNEE ARTHROPLASTY;  Surgeon: Gaynelle Arabian, MD;  Location: [facility];  Service: Orthopedics;  Laterality: Bilateral;    There were no vitals filed for this visit.      Subjective Assessment - 11/06/16 1104    Subjective On his feet more yesterday trying to build a fence to keep the dogs in. Knees are feeling pretty good.    Currently in Pain? No/denies            [facility] PT Assessment - 11/06/16 0001      AROM   Right Knee Extension 0  with overpressure   Right Knee Flexion 120   Left Knee Extension 2  with over pressure   Left Knee Flexion 122                     [facility] Adult PT Treatment/Exercise - 11/06/16 0001      Ambulation/Gait   Gait Comments lacking full knee extension with stance phase bilaterally.      Knee/Hip Exercises:  Stretches   Active Hamstring Stretch Both;3 reps   Active Hamstring Stretch Limitations contract relax with assisted stretch   Passive Hamstring Stretch Both;3 reps;30 seconds   Other Knee/Hip Stretches seated knee flexion     Knee/Hip Exercises: Aerobic   Nustep L5: 6 min      Knee/Hip Exercises: Standing   Other Standing Knee Exercises SLS bilateral 5 second holds   Other Standing Knee Exercises heel-toe walk at rail for support as needed.      Knee/Hip Exercises: Seated   Long Arc Quad Strengthening;Both;2 sets;10 reps   Long Arc Quad Limitations manual resist   Hamstring Curl Strengthening;Both;2 sets;10 reps   Hamstring Limitations manual resist   Sit to Sand 1 set;10 reps;Other (comment)  cues for even weightbearing     Knee/Hip Exercises: Supine   Other Supine Knee/Hip Exercises seated knee flexion with overpressure.      Cryotherapy   Type of Cryotherapy Other (comment)  pt declined, needing to get going after session.      Manual Therapy   Manual therapy comments good pattelar mobility bilaterally                PT  Education - 11/06/16 1258    Education provided Yes   Education Details reinforced continuing with step and prone knee extension stretches.    Person(s) Educated Patient   Methods Explanation   Comprehension Verbalized understanding             PT Long Term Goals - 11/06/16 1259      PT LONG TERM GOAL #1   Title Pt to be independent with HEP for strengthening, ROM, and balance. (12/01/16)   Time 6   Period Weeks   Status On-going     PT LONG TERM GOAL #2   Title Pt to have 0 degrees of Rt knee extension for gait stability. (12/01/16)   Time 6   Period Weeks   Status On-going     PT LONG TERM GOAL #3   Title Pt to have 0 degrees of Lt knee extension for gait stability. (12/01/16)   Time 6   Period Weeks   Status On-going     PT LONG TERM GOAL #4   Title Pt to ambulate without assistive device, community distances.  (12/01/16)   Status Achieved     PT LONG TERM GOAL #5   Title Pt to demonstrate Rt knee flexion to =/> 115 degrees for squatting tasks. (12/01/16)   Status Achieved     PT LONG TERM GOAL #6   Title Pt to demonstrate Lt knee flexion to =/> 115 degrees for squatting tasks. (12/01/16)   Status Achieved               Plan - 11/06/16 1300    Clinical Impression Statement Pt making solid progress with bilateral knee ROM and strength. Primary struggle has been regarding knee extension which is at or near 0 degress bilaterally with overpressure by end of session. Functionally the pt is still lacking knee extension with stance phase of gait. Balance is also limited but incorporating treatment into sessions. Will continue to progress pt as tolerated.    Rehab Potential Good   PT Frequency 3x / week   PT Duration 6 weeks   PT Treatment/Interventions ADLs/Self Care Home Management;Cryotherapy;Electrical Stimulation;Iontophoresis 4mg /ml Dexamethasone;Moist Heat;Traction;Ultrasound;Gait training;Stair training;Therapeutic activities;Therapeutic exercise;Patient/family education;Manual techniques;Taping;Vasopneumatic Device   PT Next Visit Plan continue progressive strengthening/ROM for bilat knees. Incorporate balance exercises.    Consulted and Agree with Plan of Care Patient      Patient will benefit from skilled therapeutic intervention in order to improve the following deficits and impairments:  Abnormal gait, Decreased activity tolerance, Decreased balance, Decreased mobility, Decreased strength, Increased edema, Impaired flexibility, Pain, Increased muscle spasms, Decreased endurance, Decreased range of motion, Difficulty walking  Visit Diagnosis: Muscle weakness (generalized)  Stiffness of right knee, not elsewhere classified  Stiffness of left knee, not elsewhere classified  Other abnormalities of gait and mobility     Problem List Patient Active Problem List   Diagnosis Date  Noted  . Drug-induced skin rash   . Rash and nonspecific skin eruption   . Heat rash   . Intermittent fever of unknown origin   . Post-operative pain   . Acute blood loss anemia   . Hypokalemia   . Constipation due to pain medication   . SIRS (systemic inflammatory response syndrome) (HCC)   . S/P TKR (total knee replacement), bilateral 10/07/2016  . Postoperative anemia due to acute blood loss 10/07/2016  . Leukocytosis 10/07/2016  . Primary osteoarthritis of knees, bilateral 10/07/2016  . OA (osteoarthritis) of knee 10/04/2016  . Primary osteoarthritis of both  knees 01/14/2016    Linard Millers, PT, CSCS 11/06/2016, 1:04 PM  [facility] [address] [place], [place], [address] Phone: [phone]   Fax:  [phone]  Name: Stephen Nixon MRN: [id number] Date of Birth: [birth year]

## 2016-11-08 ENCOUNTER — Telehealth: Payer: Self-pay | Admitting: *Deleted

## 2016-11-08 ENCOUNTER — Ambulatory Visit (INDEPENDENT_AMBULATORY_CARE_PROVIDER_SITE_OTHER): Payer: Medicare Other | Admitting: Physical Therapy

## 2016-11-08 DIAGNOSIS — M6281 Muscle weakness (generalized): Secondary | ICD-10-CM

## 2016-11-08 DIAGNOSIS — R2689 Other abnormalities of gait and mobility: Secondary | ICD-10-CM

## 2016-11-08 DIAGNOSIS — M25662 Stiffness of left knee, not elsewhere classified: Secondary | ICD-10-CM | POA: Diagnosis not present

## 2016-11-08 DIAGNOSIS — M25661 Stiffness of right knee, not elsewhere classified: Secondary | ICD-10-CM

## 2016-11-08 NOTE — Telephone Encounter (Signed)
Patient needs 1 more week of cyclobenzaprine to complete his physical therapy. Please advise on refill

## 2016-11-08 NOTE — Therapy (Signed)
[place], [place], [address] Phone: [phone]   Fax:  [phone]  Physical Therapy Treatment  Patient Details  Name: Stephen Nixon MRN: [id number] Date of Birth: Jun 19, 1941 Referring Provider: Dr. Delice Lesch   Encounter Date: 11/08/2016      PT End of Session - 11/08/16 1117    Visit Number 9   Number of Visits 18   Date for PT Re-Evaluation 12/01/16   Authorization Time Period G-codes at 10th visit   PT Start Time 1102   PT Stop Time 1152   PT Time Calculation (min) 50 min   Activity Tolerance Patient tolerated treatment well   Behavior During Therapy [facility] for tasks assessed/performed      Past Medical History:  Diagnosis Date  . Arthritis   . Closed fracture of left elbow with routine healing   . Hairy cell leukemia ([place]) 1990   due to agent orange.   . Impingement syndrome of left shoulder   . Measles   . Mumps     Past Surgical History:  Procedure Laterality Date  . 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 TONSILLECTOMY    . TOTAL KNEE ARTHROPLASTY Bilateral 10/04/2016   Procedure: BILATERAL TOTAL KNEE ARTHROPLASTY;  Surgeon: Gaynelle Arabian, MD;  Location: [facility];  Service: Orthopedics;  Laterality: Bilateral;    There were no vitals filed for this visit.      Subjective Assessment - 11/08/16 1118    Subjective Pt reports he was unable to get his knees as straight at home as he did in his last therapy session.  He does note that he is sleeping at night with pillows under his knees (bent position)  Otherwise, nothing new to report.    Patient Stated Goals Get back to traveling. Be active.   Currently in Pain? No/denies   Pain Score 0-No pain  took Tramadol prior to therapy           [facility] Adult PT Treatment/Exercise - 11/08/16 0001      Knee/Hip Exercises: Aerobic   Recumbent Bike L2: 6 min      Knee/Hip Exercises: Standing   Heel Raises Both;2  sets;10 reps  heels off of step   Other Standing Knee Exercises forward lunge with foot on 13" step, then knee ext with overpressure (10 sec hold each position) x 5 reps each leg for increased knee ROM.    Other Standing Knee Exercises tandem stance x 20 sec each side; repeated with feet on 2" pad x 2 sets.   Heel toe walking forward and backward 12 ft each way, 2 sets     Knee/Hip Exercises: Seated   Stool Scoot - Round Trips 90 ft (around gym)    Sit to Sand 1 set;10 reps;without UE support  VC for controlled descent     Modalities   Modalities Cryotherapy;Moist Heat     Moist Heat Therapy   Number Minutes Moist Heat 12 Minutes   Moist Heat Location Knee  Rt/Lt posterior prior to extension work     Cryotherapy   Number Minutes Cryotherapy 12 Minutes   Cryotherapy Location Knee  left/right anterior   Type of Cryotherapy Ice pack            PT Long Term Goals - 11/06/16 1259      PT LONG TERM GOAL #1   Title Pt to be independent with HEP for strengthening, ROM, and balance. (12/01/16)  Time 6   Period Weeks   Status On-going     PT LONG TERM GOAL #2   Title Pt to have 0 degrees of Rt knee extension for gait stability. (12/01/16)   Time 6   Period Weeks   Status On-going     PT LONG TERM GOAL #3   Title Pt to have 0 degrees of Lt knee extension for gait stability. (12/01/16)   Time 6   Period Weeks   Status On-going     PT LONG TERM GOAL #4   Title Pt to ambulate without assistive device, community distances. (12/01/16)   Status Achieved     PT LONG TERM GOAL #5   Title Pt to demonstrate Rt knee flexion to =/> 115 degrees for squatting tasks. (12/01/16)   Status Achieved     PT LONG TERM GOAL #6   Title Pt to demonstrate Lt knee flexion to =/> 115 degrees for squatting tasks. (12/01/16)   Status Achieved               Plan - 11/08/16 1143    Clinical Impression Statement Pt had good response with Rock tape for edema reduction and scar  management.  Tape removed; can retape at next appt.  Pt tolerated new exercises well, without any increase in symptoms.  Balance is improving.  Pt progressing towards established goals.    Rehab Potential Good   PT Frequency 3x / week   PT Duration 6 weeks   PT Treatment/Interventions ADLs/Self Care Home Management;Cryotherapy;Electrical Stimulation;Iontophoresis 4mg /ml Dexamethasone;Moist Heat;Traction;Ultrasound;Gait training;Stair training;Therapeutic activities;Therapeutic exercise;Patient/family education;Manual techniques;Taping;Vasopneumatic Device   PT Next Visit Plan 10 visit assessment; Gcode.  Retape bilat knees for edema reduction.    Consulted and Agree with Plan of Care Patient      Patient will benefit from skilled therapeutic intervention in order to improve the following deficits and impairments:  Abnormal gait, Decreased activity tolerance, Decreased balance, Decreased mobility, Decreased strength, Increased edema, Impaired flexibility, Pain, Increased muscle spasms, Decreased endurance, Decreased range of motion, Difficulty walking  Visit Diagnosis: Muscle weakness (generalized)  Stiffness of right knee, not elsewhere classified  Stiffness of left knee, not elsewhere classified  Other abnormalities of gait and mobility     Problem List Patient Active Problem List   Diagnosis Date Noted  . Drug-induced skin rash   . Rash and nonspecific skin eruption   . Heat rash   . Intermittent fever of unknown origin   . Post-operative pain   . Acute blood loss anemia   . Hypokalemia   . Constipation due to pain medication   . SIRS (systemic inflammatory response syndrome) (HCC)   . S/P TKR (total knee replacement), bilateral 10/07/2016  . Postoperative anemia due to acute blood loss 10/07/2016  . Leukocytosis 10/07/2016  . Primary osteoarthritis of knees, bilateral 10/07/2016  . OA (osteoarthritis) of knee 10/04/2016  . Primary osteoarthritis of both knees 01/14/2016    Kerin Perna, PTA 11/08/16 11:49 AM  [facility] [place], [place], [address] Phone: [phone]   Fax:  [phone]  Name: Stephen Nixon MRN: [id number] Date of Birth: 10/18/1941

## 2016-11-09 MED ORDER — CYCLOBENZAPRINE HCL 5 MG PO TABS: ORAL_TABLET | ORAL | 0 refills | Status: DC

## 2016-11-09 NOTE — Telephone Encounter (Signed)
Medication ordered, pt notified

## 2016-11-10 ENCOUNTER — Ambulatory Visit (INDEPENDENT_AMBULATORY_CARE_PROVIDER_SITE_OTHER): Payer: Medicare Other | Admitting: Rehabilitative and Restorative Service Providers"

## 2016-11-10 ENCOUNTER — Encounter: Payer: Self-pay | Admitting: Rehabilitative and Restorative Service Providers"

## 2016-11-10 DIAGNOSIS — M6281 Muscle weakness (generalized): Secondary | ICD-10-CM

## 2016-11-10 DIAGNOSIS — R2689 Other abnormalities of gait and mobility: Secondary | ICD-10-CM | POA: Diagnosis not present

## 2016-11-10 DIAGNOSIS — M25661 Stiffness of right knee, not elsewhere classified: Secondary | ICD-10-CM

## 2016-11-10 DIAGNOSIS — M25561 Pain in right knee: Secondary | ICD-10-CM | POA: Diagnosis not present

## 2016-11-10 DIAGNOSIS — M25662 Stiffness of left knee, not elsewhere classified: Secondary | ICD-10-CM

## 2016-11-10 DIAGNOSIS — M25562 Pain in left knee: Secondary | ICD-10-CM

## 2016-11-10 NOTE — Therapy (Signed)
[place], [place], [address] Phone: [phone]   Fax:  [phone]  Physical Therapy Treatment  Patient Details  Name: Aristotelis Giovanelli MRN: [id number] Date of Birth: [birth year] Referring Provider: Dr Delice Lesch   Encounter Date: 11/10/2016      PT End of Session - 11/10/16 1111    Visit Number 10   Number of Visits 18   Date for PT Re-Evaluation 12/01/16   Authorization Time Period G-codes at 03/30/2023 visit   PT Start Time 1059   PT Stop Time 1146   PT Time Calculation (min) 47 min   Activity Tolerance Patient tolerated treatment well      Past Medical History:  Diagnosis Date  . Arthritis   . Closed fracture of left elbow with routine healing   . Hairy cell leukemia ([place]) 1990   due to agent orange.   . Impingement syndrome of left shoulder   . Measles   . Mumps     Past Surgical History:  Procedure Laterality Date  . 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 TONSILLECTOMY    . TOTAL KNEE ARTHROPLASTY Bilateral 10/04/2016   Procedure: BILATERAL TOTAL KNEE ARTHROPLASTY;  Surgeon: Gaynelle Arabian, MD;  Location: [facility];  Service: Orthopedics;  Laterality: Bilateral;    There were no vitals filed for this visit.      Subjective Assessment - 11/10/16 1112    Subjective Still needs to work on straightening the knees - can tell they are getting better. Working on exercises at home    Currently in Pain? No/denies            [facility] PT Assessment - 11/10/16 0001      Assessment   Medical Diagnosis bilateral TKA   Referring Provider Dr Delice Lesch    Onset Date/Surgical Date 10/04/16   Next MD Visit 11/14/16   Prior Therapy none     Observation/Other Assessments   Focus on Therapeutic Outcomes (FOTO)  50% limitation      AROM   Right Knee Extension 0  with overpressure   Right Knee Flexion 120   Left Knee Extension 2  with over pressure   Left Knee Flexion 122                     [facility] Adult PT Treatment/Exercise - 11/10/16 0001      Knee/Hip Exercises: Stretches   Passive Hamstring Stretch Both;3 reps;30 seconds   Passive Hamstring Stretch Limitations added HS in adduction and abduction 30 sec x 2 ea    Gastroc Stretch Right;Left;2 reps;30 seconds   Other Knee/Hip Stretches standing back at wall 4 in ball posterior knee - knee ext 10 sec x 10 bilat    Other Knee/Hip Stretches standing straigtening both knees straight - hip extension 10 sec x 10      Knee/Hip Exercises: Aerobic   Recumbent Bike L2: 6 min      Knee/Hip Exercises: Standing   Terminal Knee Extension Limitations standing TKE gree TB 10 sec x 10    Hip Abduction Stengthening;Right;Left;10 reps  green TB   Hip Extension Stengthening;Right;Left;10 reps  gree TB    Other Standing Knee Exercises tandem stance x 20 sec each side     Knee/Hip Exercises: Supine   Heel Prop for Knee Extension --  w/ quad sets 10 sec x 10 bilat      Modalities   Modalities --  declined modalities today                     PT Long Term Goals - 11/10/16 1235      PT LONG TERM GOAL #1   Title Pt to be independent with HEP for strengthening, ROM, and balance. (12/01/16)   Time 6   Period Weeks   Status On-going     PT LONG TERM GOAL #2   Title Pt to have 0 degrees of Rt knee extension for gait stability. (12/01/16)   Time 6   Period Weeks   Status On-going     PT LONG TERM GOAL #3   Title Pt to have 0 degrees of Lt knee extension for gait stability. (12/01/16)   Time 6   Period Weeks   Status On-going     PT LONG TERM GOAL #4   Title Pt to ambulate without assistive device, community distances. (12/01/16)   Time 6   Period Weeks   Status Achieved     PT LONG TERM GOAL #5   Title Pt to demonstrate Rt knee flexion to =/> 115 degrees for squatting tasks. (12/01/16)   Time 6   Period Weeks   Status Achieved     PT LONG TERM GOAL #6   Title Pt to  demonstrate Lt knee flexion to =/> 115 degrees for squatting tasks. (12/01/16)   Time 6   Period Weeks   Status Achieved               Plan - 11/10/16 1233    Clinical Impression Statement Progressing well with ROM/strenght/function. Good improvement with FOTO from 70% limitation to 50% limition. Will benefit from continued PT to accomplish goals and reach maximum rehab potential.    Rehab Potential Good   PT Frequency 3x / week   PT Duration 6 weeks   PT Treatment/Interventions ADLs/Self Care Home Management;Cryotherapy;Electrical Stimulation;Iontophoresis 4mg /ml Dexamethasone;Moist Heat;Traction;Ultrasound;Gait training;Stair training;Therapeutic activities;Therapeutic exercise;Patient/family education;Manual techniques;Taping;Vasopneumatic Device   PT Next Visit Plan Retape bilat knees for edema reduction as indicated; progress with ROM and strengthening   Consulted and Agree with Plan of Care Patient      Patient will benefit from skilled therapeutic intervention in order to improve the following deficits and impairments:  Abnormal gait, Decreased activity tolerance, Decreased balance, Decreased mobility, Decreased strength, Increased edema, Impaired flexibility, Pain, Increased muscle spasms, Decreased endurance, Decreased range of motion, Difficulty walking  Visit Diagnosis: Muscle weakness (generalized)  Stiffness of right knee, not elsewhere classified  Stiffness of left knee, not elsewhere classified  Other abnormalities of gait and mobility  Acute pain of both knees     Problem List Patient Active Problem List   Diagnosis Date Noted  . Drug-induced skin rash   . Rash and nonspecific skin eruption   . Heat rash   . Intermittent fever of unknown origin   . Post-operative pain   . Acute blood loss anemia   . Hypokalemia   . Constipation due to pain medication   . SIRS (systemic inflammatory response syndrome) (HCC)   . S/P TKR (total knee replacement),  bilateral 10/07/2016  . Postoperative anemia due to acute blood loss 10/07/2016  . Leukocytosis 10/07/2016  . Primary osteoarthritis of knees, bilateral 10/07/2016  . OA (osteoarthritis) of knee 10/04/2016  . Primary osteoarthritis of both knees 01/14/2016    Jaikob Borgwardt [name] PT, MPH  11/10/2016, 12:37 PM  [place],  [place], [address] Phone: [phone]   Fax:  [phone]  Name: Nevaan Aherne MRN: [id number] Date of Birth: [birth year]

## 2016-11-13 ENCOUNTER — Ambulatory Visit (INDEPENDENT_AMBULATORY_CARE_PROVIDER_SITE_OTHER): Payer: Medicare Other | Admitting: Physical Therapy

## 2016-11-13 ENCOUNTER — Encounter: Payer: Self-pay | Admitting: Physical Therapy

## 2016-11-13 DIAGNOSIS — M25662 Stiffness of left knee, not elsewhere classified: Secondary | ICD-10-CM

## 2016-11-13 DIAGNOSIS — R2689 Other abnormalities of gait and mobility: Secondary | ICD-10-CM | POA: Diagnosis not present

## 2016-11-13 DIAGNOSIS — M6281 Muscle weakness (generalized): Secondary | ICD-10-CM

## 2016-11-13 DIAGNOSIS — M25661 Stiffness of right knee, not elsewhere classified: Secondary | ICD-10-CM | POA: Diagnosis not present

## 2016-11-13 NOTE — Therapy (Signed)
[place], [place], [address] Phone: [phone]   Fax:  [phone]  Physical Therapy Treatment  Patient Details  Name: Stephen Nixon MRN: [id number] Date of Birth: [birth year] Referring Provider: Dr Delice Lesch   Encounter Date: 11/13/2016      PT End of Session - 11/13/16 1317    Visit Number 11   Number of Visits 18   Date for PT Re-Evaluation 12/01/16   Authorization Time Period G-codes at 10-Mar-2023 visit   PT Start Time 1102   PT Stop Time 1146   PT Time Calculation (min) 44 min   Activity Tolerance Patient tolerated treatment well   Behavior During Therapy [facility] for tasks assessed/performed      Past Medical History:  Diagnosis Date  . Arthritis   . Closed fracture of left elbow with routine healing   . Hairy cell leukemia ([place]) 1990   due to agent orange.   . Impingement syndrome of left shoulder   . Measles   . Mumps     Past Surgical History:  Procedure Laterality Date  . 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 TONSILLECTOMY    . TOTAL KNEE ARTHROPLASTY Bilateral 10/04/2016   Procedure: BILATERAL TOTAL KNEE ARTHROPLASTY;  Surgeon: Gaynelle Arabian, MD;  Location: [facility];  Service: Orthopedics;  Laterality: Bilateral;    There were no vitals filed for this visit.      Subjective Assessment - 11/13/16 1106    Subjective Overall, happy with progress. Still working on getting the knees straight at home. Didn't take any pain meds yesterday, but did take some before coming to PT.    Currently in Pain? No/denies            [facility] PT Assessment - 11/13/16 0001      AROM   Right Knee Extension 0  with overpressure   Left Knee Extension 0  with overpressure                     [facility] Adult PT Treatment/Exercise - 11/13/16 0001      Ambulation/Gait   Gait Comments lacking full knee extension with stance phase bilaterally, cues to focus on terminal  extension during midstance.      Knee/Hip Exercises: Stretches   Passive Hamstring Stretch Both;5 reps;30 seconds   Passive Hamstring Stretch Limitations supine knee extension stretch with bolster at heel and overpressure     Knee/Hip Exercises: Aerobic   Recumbent Bike L3 X 4 min with knee flexion progression   Nustep L4 X54min     Knee/Hip Exercises: Standing   Terminal Knee Extension Limitations standing TKE gree TB 10 sec x 10    Forward Step Up Both;1 set;10 reps   Forward Step Up Limitations cues for full knee extension   Functional Squat 10 reps;2 sets   SLS bilateral X5 reps < 10 sec holds performed     Knee/Hip Exercises: Seated   Long Arc Quad Strengthening;Both;2 sets;15 reps   Long Arc Quad Weight 5 lbs.   Clamshell with TheraBand --  hip abd with manual resist. 1X10     Modalities   Modalities --  pt declined     Manual Therapy   Manual therapy comments patellar mobs during extension stretch                PT Education - 11/13/16 1316    Education provided Yes   Education Details  knee extension with gait, technique with HEP   Person(s) Educated Patient   Methods Explanation;Demonstration;Tactile cues;Verbal cues   Comprehension Verbalized understanding;Returned demonstration             PT Long Term Goals - 11/13/16 1318      PT LONG TERM GOAL #1   Title Pt to be independent with HEP for strengthening, ROM, and balance. (12/01/16)   Time 6   Period Weeks   Status On-going     PT LONG TERM GOAL #2   Title Pt to have 0 degrees of Rt knee extension for gait stability. (12/01/16)   Time 6   Period Weeks   Status On-going     PT LONG TERM GOAL #3   Title Pt to have 0 degrees of Lt knee extension for gait stability. (12/01/16)   Time 6   Period Weeks   Status On-going     PT LONG TERM GOAL #4   Title Pt to ambulate without assistive device, community distances. (12/01/16)   Status Achieved     PT LONG TERM GOAL #5   Title Pt to  demonstrate Rt knee flexion to =/> 115 degrees for squatting tasks. (12/01/16)   Status Achieved     PT LONG TERM GOAL #6   Title Pt to demonstrate Lt knee flexion to =/> 115 degrees for squatting tasks. (12/01/16)   Status Achieved               Plan - 11/13/16 1112    Clinical Impression Statement Pt continuing to progress with motion and strength with improving overall function. Pt reports feeling confident with ambulation, using cane less and less. Pt continues to demonstrate benefit from continued skilled PT sessions. Deficits remain with knee extension and balance activities. Due to progress, decreasing fz to 2Xweek.    Rehab Potential Good   PT Frequency 2x / week   PT Duration 6 weeks   PT Treatment/Interventions ADLs/Self Care Home Management;Cryotherapy;Electrical Stimulation;Iontophoresis 4mg /ml Dexamethasone;Moist Heat;Traction;Ultrasound;Gait training;Stair training;Therapeutic activities;Therapeutic exercise;Patient/family education;Manual techniques;Taping;Vasopneumatic Device   PT Next Visit Plan ROM/strengthening as tolerated.    Consulted and Agree with Plan of Care Patient      Patient will benefit from skilled therapeutic intervention in order to improve the following deficits and impairments:  Abnormal gait, Decreased activity tolerance, Decreased balance, Decreased mobility, Decreased strength, Increased edema, Impaired flexibility, Pain, Increased muscle spasms, Decreased endurance, Decreased range of motion, Difficulty walking  Visit Diagnosis: Muscle weakness (generalized)  Stiffness of right knee, not elsewhere classified  Stiffness of left knee, not elsewhere classified  Other abnormalities of gait and mobility     Problem List Patient Active Problem List   Diagnosis Date Noted  . Drug-induced skin rash   . Rash and nonspecific skin eruption   . Heat rash   . Intermittent fever of unknown origin   . Post-operative pain   . Acute blood loss  anemia   . Hypokalemia   . Constipation due to pain medication   . SIRS (systemic inflammatory response syndrome) (HCC)   . S/P TKR (total knee replacement), bilateral 10/07/2016  . Postoperative anemia due to acute blood loss 10/07/2016  . Leukocytosis 10/07/2016  . Primary osteoarthritis of knees, bilateral 10/07/2016  . OA (osteoarthritis) of knee 10/04/2016  . Primary osteoarthritis of both knees 01/14/2016    Linard Millers, PT, CSCS 11/13/2016, 1:20 PM  [facility] [place], [place], [address] Phone: [phone]   Fax:  [phone]  Name: Stephen Nixon MRN: [id number] Date of Birth: 1940-12-15

## 2016-11-15 ENCOUNTER — Encounter: Payer: Self-pay | Admitting: Physical Therapy

## 2016-11-15 ENCOUNTER — Ambulatory Visit (INDEPENDENT_AMBULATORY_CARE_PROVIDER_SITE_OTHER): Payer: Medicare Other | Admitting: Physical Therapy

## 2016-11-15 DIAGNOSIS — R2689 Other abnormalities of gait and mobility: Secondary | ICD-10-CM

## 2016-11-15 DIAGNOSIS — M25662 Stiffness of left knee, not elsewhere classified: Secondary | ICD-10-CM | POA: Diagnosis not present

## 2016-11-15 DIAGNOSIS — M6281 Muscle weakness (generalized): Secondary | ICD-10-CM | POA: Diagnosis present

## 2016-11-15 DIAGNOSIS — M25661 Stiffness of right knee, not elsewhere classified: Secondary | ICD-10-CM | POA: Diagnosis not present

## 2016-11-15 NOTE — Therapy (Signed)
[place], [place], [address] Phone: [phone]   Fax:  [phone]  Physical Therapy Treatment  Patient Details  Name: Stephen Nixon MRN: [id number] Date of Birth: 1941-07-03 Referring Provider: Dr Delice Lesch   Encounter Date: 11/15/2016      PT End of Session - 11/15/16 1153    Visit Number 12   Number of Visits 18   Date for PT Re-Evaluation 12/01/16   Authorization Time Period G-codes at 2023-03-14 visit   PT Start Time 1103   PT Stop Time 1147   PT Time Calculation (min) 44 min   Activity Tolerance Patient tolerated treatment well   Behavior During Therapy [facility] for tasks assessed/performed      Past Medical History:  Diagnosis Date  . Arthritis   . Closed fracture of left elbow with routine healing   . Hairy cell leukemia ([place]) 1990   due to agent orange.   . Impingement syndrome of left shoulder   . Measles   . Mumps     Past Surgical History:  Procedure Laterality Date  . 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 TONSILLECTOMY    . TOTAL KNEE ARTHROPLASTY Bilateral 10/04/2016   Procedure: BILATERAL TOTAL KNEE ARTHROPLASTY;  Surgeon: Gaynelle Arabian, MD;  Location: [facility];  Service: Orthopedics;  Laterality: Bilateral;    There were no vitals filed for this visit.      Subjective Assessment - 11/15/16 1108    Subjective Went to the surgeon yesterday, doing great, knees look great. Pt feels like things are going great. Having trouble with some plantar fasciatis.    Currently in Pain? No/denies   Aggravating Factors  doing too much   Pain Relieving Factors taking a rest                         [facility] Adult PT Treatment/Exercise - 11/15/16 0001      Knee/Hip Exercises: Stretches   Other Knee/Hip Stretches prone flexion/extension stretches with over pressure, bilateral X 5 reps     Knee/Hip Exercises: Aerobic   Recumbent Bike L1 X4 min (focus on  knee flexion with increasing flexion)   Nustep L4 X30min     Knee/Hip Exercises: Standing   Hip Abduction Stengthening;Right;Left;10 reps  green TB   Abduction Limitations cues for technique   Lateral Step Up Both;1 set;10 reps;Step Height: 6"   Lateral Step Up Limitations cues for full knee extension   Forward Step Up Both;1 set;10 reps   Functional Squat 10 reps;1 set     Knee/Hip Exercises: Supine   Other Supine Knee/Hip Exercises knee extension stretch with overpressure with bolster at heels     Modalities   Modalities Cryotherapy     Cryotherapy   Number Minutes Cryotherapy 10 Minutes   Cryotherapy Location Knee  bilateral   Type of Cryotherapy Ice pack                PT Education - 11/15/16 1153    Education provided Yes   Education Details reminderfor knee extension with gait and standing exercises   Person(s) Educated Patient   Methods Explanation;Tactile cues;Verbal cues   Comprehension Verbalized understanding;Returned demonstration             PT Long Term Goals - 11/13/16 1318      PT LONG TERM GOAL #1   Title Pt to be independent with HEP for strengthening,  ROM, and balance. (12/01/16)   Time 6   Period Weeks   Status On-going     PT LONG TERM GOAL #2   Title Pt to have 0 degrees of Rt knee extension for gait stability. (12/01/16)   Time 6   Period Weeks   Status On-going     PT LONG TERM GOAL #3   Title Pt to have 0 degrees of Lt knee extension for gait stability. (12/01/16)   Time 6   Period Weeks   Status On-going     PT LONG TERM GOAL #4   Title Pt to ambulate without assistive device, community distances. (12/01/16)   Status Achieved     PT LONG TERM GOAL #5   Title Pt to demonstrate Rt knee flexion to =/> 115 degrees for squatting tasks. (12/01/16)   Status Achieved     PT LONG TERM GOAL #6   Title Pt to demonstrate Lt knee flexion to =/> 115 degrees for squatting tasks. (12/01/16)   Status Achieved                Plan - 11/15/16 1154    Clinical Impression Statement Continue to work on combination of knee ROM (extension bias) and strengthening. Pt making progress gradually with knee extension but needed cues to use range functionally.    Rehab Potential Good   PT Frequency 2x / week   PT Duration 6 weeks   PT Treatment/Interventions ADLs/Self Care Home Management;Cryotherapy;Electrical Stimulation;Iontophoresis 4mg /ml Dexamethasone;Moist Heat;Traction;Ultrasound;Gait training;Stair training;Therapeutic activities;Therapeutic exercise;Patient/family education;Manual techniques;Taping;Vasopneumatic Device   PT Next Visit Plan ROM/strengthening as tolerated.    Consulted and Agree with Plan of Care Patient      Patient will benefit from skilled therapeutic intervention in order to improve the following deficits and impairments:  Abnormal gait, Decreased activity tolerance, Decreased balance, Decreased mobility, Decreased strength, Increased edema, Impaired flexibility, Pain, Increased muscle spasms, Decreased endurance, Decreased range of motion, Difficulty walking  Visit Diagnosis: Muscle weakness (generalized)  Stiffness of right knee, not elsewhere classified  Stiffness of left knee, not elsewhere classified  Other abnormalities of gait and mobility     Problem List Patient Active Problem List   Diagnosis Date Noted  . Drug-induced skin rash   . Rash and nonspecific skin eruption   . Heat rash   . Intermittent fever of unknown origin   . Post-operative pain   . Acute blood loss anemia   . Hypokalemia   . Constipation due to pain medication   . SIRS (systemic inflammatory response syndrome) (HCC)   . S/P TKR (total knee replacement), bilateral 10/07/2016  . Postoperative anemia due to acute blood loss 10/07/2016  . Leukocytosis 10/07/2016  . Primary osteoarthritis of knees, bilateral 10/07/2016  . OA (osteoarthritis) of knee 10/04/2016  . Primary osteoarthritis of both knees  01/14/2016    Linard Millers, PT, CSCS 11/15/2016, 11:56 AM  [facility] [place], [place], [address] Phone: [phone]   Fax:  [phone]  Name: Stephen Nixon MRN: [id number] Date of Birth: 25-Dec-1940

## 2016-11-17 ENCOUNTER — Ambulatory Visit (INDEPENDENT_AMBULATORY_CARE_PROVIDER_SITE_OTHER): Payer: Medicare Other | Admitting: Physical Therapy

## 2016-11-17 ENCOUNTER — Encounter: Payer: Self-pay | Admitting: Physical Therapy

## 2016-11-17 DIAGNOSIS — R2689 Other abnormalities of gait and mobility: Secondary | ICD-10-CM | POA: Diagnosis not present

## 2016-11-17 DIAGNOSIS — M25662 Stiffness of left knee, not elsewhere classified: Secondary | ICD-10-CM

## 2016-11-17 DIAGNOSIS — M6281 Muscle weakness (generalized): Secondary | ICD-10-CM

## 2016-11-17 DIAGNOSIS — M25661 Stiffness of right knee, not elsewhere classified: Secondary | ICD-10-CM | POA: Diagnosis not present

## 2016-11-17 NOTE — Therapy (Signed)
[place], [place], [address] Phone: [phone]   Fax:  [phone]  Physical Therapy Treatment  Patient Details  Name: Stephen Nixon MRN: [id number] Date of Birth: 08-13-1941 Referring Provider: Dr Delice Lesch   Encounter Date: 11/17/2016      PT End of Session - 11/17/16 1254    Visit Number 13   Number of Visits 18   Date for PT Re-Evaluation 12/01/16   Authorization Time Period G-codes at 03-29-23 visit   PT Start Time 1158   PT Stop Time 1241   PT Time Calculation (min) 43 min   Activity Tolerance Patient tolerated treatment well   Behavior During Therapy [facility] for tasks assessed/performed      Past Medical History:  Diagnosis Date  . Arthritis   . Closed fracture of left elbow with routine healing   . Hairy cell leukemia ([place]) 1990   due to agent orange.   . Impingement syndrome of left shoulder   . Measles   . Mumps     Past Surgical History:  Procedure Laterality Date  . 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 TONSILLECTOMY    . TOTAL KNEE ARTHROPLASTY Bilateral 10/04/2016   Procedure: BILATERAL TOTAL KNEE ARTHROPLASTY;  Surgeon: Gaynelle Arabian, MD;  Location: [facility];  Service: Orthopedics;  Laterality: Bilateral;    There were no vitals filed for this visit.      Subjective Assessment - 11/17/16 1204    Subjective Doing pretty good, things are going well. Still working on getting the knees straight and balance improved.    Currently in Pain? No/denies                         [facility] Adult PT Treatment/Exercise - 11/17/16 0001      Knee/Hip Exercises: Stretches   Other Knee/Hip Stretches prone flexion/extension stretches with over pressure, bilateral X 5 reps   Other Knee/Hip Stretches knee extension with overpressure, bolster at heels     Knee/Hip Exercises: Aerobic   Recumbent Bike L2 X4 min (focus on knee flexion with increasing flexion)    Nustep L4 X15min     Knee/Hip Exercises: Standing   Lateral Step Up Both;1 set;10 reps;Step Height: 6"   Lateral Step Up Limitations cues for full knee extension   Functional Squat 10 reps;1 set   Other Standing Knee Exercises SLS bilateral   Other Standing Knee Exercises heel toe, front cross-overs bilaterally     Modalities   Modalities Cryotherapy     Cryotherapy   Number Minutes Cryotherapy 15 Minutes   Cryotherapy Location Knee  bilateral   Type of Cryotherapy Ice pack                     PT Long Term Goals - 11/13/16 1318      PT LONG TERM GOAL #1   Title Pt to be independent with HEP for strengthening, ROM, and balance. (12/01/16)   Time 6   Period Weeks   Status On-going     PT LONG TERM GOAL #2   Title Pt to have 0 degrees of Rt knee extension for gait stability. (12/01/16)   Time 6   Period Weeks   Status On-going     PT LONG TERM GOAL #3   Title Pt to have 0 degrees of Lt knee extension for gait stability. (12/01/16)   Time 6  Period Weeks   Status On-going     PT LONG TERM GOAL #4   Title Pt to ambulate without assistive device, community distances. (12/01/16)   Status Achieved     PT LONG TERM GOAL #5   Title Pt to demonstrate Rt knee flexion to =/> 115 degrees for squatting tasks. (12/01/16)   Status Achieved     PT LONG TERM GOAL #6   Title Pt to demonstrate Lt knee flexion to =/> 115 degrees for squatting tasks. (12/01/16)   Status Achieved               Plan - 11/17/16 1255    Clinical Impression Statement Pt continuing to make progress with PT intervention. Incoporating balance/stability activities into exercise program. Having difficulty maintaining knee extension at endrange between sessions.    Rehab Potential Good   PT Frequency 2x / week   PT Duration 6 weeks   PT Treatment/Interventions ADLs/Self Care Home Management;Cryotherapy;Electrical Stimulation;Iontophoresis 4mg /ml Dexamethasone;Moist  Heat;Traction;Ultrasound;Gait training;Stair training;Therapeutic activities;Therapeutic exercise;Patient/family education;Manual techniques;Taping;Vasopneumatic Device   PT Next Visit Plan ROM/strengthening as tolerated.    Consulted and Agree with Plan of Care Patient      Patient will benefit from skilled therapeutic intervention in order to improve the following deficits and impairments:     Visit Diagnosis: Muscle weakness (generalized)  Stiffness of right knee, not elsewhere classified  Stiffness of left knee, not elsewhere classified  Other abnormalities of gait and mobility     Problem List Patient Active Problem List   Diagnosis Date Noted  . Drug-induced skin rash   . Rash and nonspecific skin eruption   . Heat rash   . Intermittent fever of unknown origin   . Post-operative pain   . Acute blood loss anemia   . Hypokalemia   . Constipation due to pain medication   . SIRS (systemic inflammatory response syndrome) (HCC)   . S/P TKR (total knee replacement), bilateral 10/07/2016  . Postoperative anemia due to acute blood loss 10/07/2016  . Leukocytosis 10/07/2016  . Primary osteoarthritis of knees, bilateral 10/07/2016  . OA (osteoarthritis) of knee 10/04/2016  . Primary osteoarthritis of both knees 01/14/2016    Linard Millers, PT, CSCS 11/17/2016, 12:57 PM  [facility] [place], [place], [address] Phone: [phone]   Fax:  [phone]  Name: Navraj Sampayo MRN: [id number] Date of Birth: 07-29-1941

## 2016-11-21 ENCOUNTER — Ambulatory Visit (INDEPENDENT_AMBULATORY_CARE_PROVIDER_SITE_OTHER): Payer: Medicare Other | Admitting: Physical Therapy

## 2016-11-21 DIAGNOSIS — R2689 Other abnormalities of gait and mobility: Secondary | ICD-10-CM | POA: Diagnosis not present

## 2016-11-21 DIAGNOSIS — M25662 Stiffness of left knee, not elsewhere classified: Secondary | ICD-10-CM | POA: Diagnosis not present

## 2016-11-21 DIAGNOSIS — M25661 Stiffness of right knee, not elsewhere classified: Secondary | ICD-10-CM

## 2016-11-21 DIAGNOSIS — M6281 Muscle weakness (generalized): Secondary | ICD-10-CM

## 2016-11-21 NOTE — Therapy (Signed)
[place], [place], [address] Phone: [phone]   Fax:  [phone]  Physical Therapy Treatment  Patient Details  Name: Stephen Nixon MRN: [id number] Date of Birth: 27-May-1941 Referring Provider: Dr. Delice Lesch   Encounter Date: 11/21/2016      PT End of Session - 11/21/16 1024    Visit Number 14   Number of Visits 18   Date for PT Re-Evaluation 12/01/16   Authorization Time Period G-codes at 2023-03-13 visit   PT Start Time 1017   PT Stop Time 1111   PT Time Calculation (min) 54 min   Activity Tolerance Patient tolerated treatment well   Behavior During Therapy [facility] for tasks assessed/performed      Past Medical History:  Diagnosis Date  . Arthritis   . Closed fracture of left elbow with routine healing   . Hairy cell leukemia ([place]) 1990   due to agent orange.   . Impingement syndrome of left shoulder   . Measles   . Mumps     Past Surgical History:  Procedure Laterality Date  . 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 TONSILLECTOMY    . TOTAL KNEE ARTHROPLASTY Bilateral 10/04/2016   Procedure: BILATERAL TOTAL KNEE ARTHROPLASTY;  Surgeon: Gaynelle Arabian, MD;  Location: [facility];  Service: Orthopedics;  Laterality: Bilateral;    There were no vitals filed for this visit.      Subjective Assessment - 11/21/16 1024    Subjective Pt reports he saw the doctor last week.  He was pleased with the flexion ROM in knees, but would like 5 more degrees of extension.  He has been working on balance and stairs at home.     Currently in Pain? No/denies   Pain Score 0-No pain            [facility] PT Assessment - 11/21/16 0001      Assessment   Medical Diagnosis bilateral TKA   Referring Provider Dr. Delice Lesch    Onset Date/Surgical Date 10/04/16     AROM   Right Knee Extension 3  with quad set in supine   Left Knee Extension 5  with quad set in supine          [facility]  Adult PT Treatment/Exercise - 11/21/16 0001      Knee/Hip Exercises: Stretches   Passive Hamstring Stretch Right;Left;2 reps;30 seconds  with overpressure from UE.    Gastroc Stretch Right;Left;2 reps;30 seconds     Knee/Hip Exercises: Aerobic   Recumbent Bike L3: 6 min      Knee/Hip Exercises: Standing   Heel Raises Both;2 sets;10 reps;1 second   Lateral Step Up Both;1 set;10 reps;Step Height: 6"   Lateral Step Up Limitations cues for full knee extension   Functional Squat 10 reps;1 set  touching 10" stool with hands    SLS on 3" pad:  4 trials each leg.    Other Standing Knee Exercises tandem stance on 3" pad x 30 sec x 2 reps;   High kneeling on 3" pad with attempts to sit back towards heels x 3 reps, then standing up from this position .   Other Standing Knee Exercises heel toe walk forward/backward (occasional UE support) , front cross-overs bilaterally x 12 ft x 4 reps      Knee/Hip Exercises: Supine   Quad Sets 5 reps  for measurement      Moist Heat Therapy   Number  Minutes Moist Heat 12 Minutes   Moist Heat Location Knee  bilat posterior     Cryotherapy   Number Minutes Cryotherapy 12 Minutes   Cryotherapy Location Knee  bilat anterior    Type of Cryotherapy Ice pack                     PT Long Term Goals - 11/13/16 1318      PT LONG TERM GOAL #1   Title Pt to be independent with HEP for strengthening, ROM, and balance. (12/01/16)   Time 6   Period Weeks   Status On-going     PT LONG TERM GOAL #2   Title Pt to have 0 degrees of Rt knee extension for gait stability. (12/01/16)   Time 6   Period Weeks   Status On-going     PT LONG TERM GOAL #3   Title Pt to have 0 degrees of Lt knee extension for gait stability. (12/01/16)   Time 6   Period Weeks   Status On-going     PT LONG TERM GOAL #4   Title Pt to ambulate without assistive device, community distances. (12/01/16)   Status Achieved     PT LONG TERM GOAL #5   Title Pt to demonstrate  Rt knee flexion to =/> 115 degrees for squatting tasks. (12/01/16)   Status Achieved     PT LONG TERM GOAL #6   Title Pt to demonstrate Lt knee flexion to =/> 115 degrees for squatting tasks. (12/01/16)   Status Achieved               Plan - 11/21/16 1101    Clinical Impression Statement Pt tolerated all new exercises well, with minimal discomfort.  High kneeling was challenging due to tightness in R>L quad. He would like to incorporate kneeling exercise to assist in return to this position for household work.  He continues with limited bilat knee ROM.   Pt is progressing well towards remaining goals.    Rehab Potential Good   PT Frequency 2x / week   PT Duration 6 weeks   PT Treatment/Interventions ADLs/Self Care Home Management;Cryotherapy;Electrical Stimulation;Iontophoresis 4mg /ml Dexamethasone;Moist Heat;Traction;Ultrasound;Gait training;Stair training;Therapeutic activities;Therapeutic exercise;Patient/family education;Manual techniques;Taping;Vasopneumatic Device   PT Next Visit Plan Continue progressive ROM/strengthening as tolerated.    Consulted and Agree with Plan of Care Patient      Patient will benefit from skilled therapeutic intervention in order to improve the following deficits and impairments:  Abnormal gait, Decreased activity tolerance, Decreased balance, Decreased mobility, Decreased strength, Increased edema, Impaired flexibility, Pain, Increased muscle spasms, Decreased endurance, Decreased range of motion, Difficulty walking  Visit Diagnosis: Muscle weakness (generalized)  Stiffness of right knee, not elsewhere classified  Stiffness of left knee, not elsewhere classified  Other abnormalities of gait and mobility     Problem List Patient Active Problem List   Diagnosis Date Noted  . Drug-induced skin rash   . Rash and nonspecific skin eruption   . Heat rash   . Intermittent fever of unknown origin   . Post-operative pain   . Acute blood loss  anemia   . Hypokalemia   . Constipation due to pain medication   . SIRS (systemic inflammatory response syndrome) (HCC)   . S/P TKR (total knee replacement), bilateral 10/07/2016  . Postoperative anemia due to acute blood loss 10/07/2016  . Leukocytosis 10/07/2016  . Primary osteoarthritis of knees, bilateral 10/07/2016  . OA (osteoarthritis) of knee 10/04/2016  . Primary osteoarthritis  of both knees 01/14/2016   Kerin Perna, PTA 11/21/16 11:03 AM  [place], [place], [address] Phone: [phone]   Fax:  [phone]  Name: Stephen Nixon MRN: [id number] Date of Birth: 09-16-1941

## 2016-11-23 ENCOUNTER — Ambulatory Visit (INDEPENDENT_AMBULATORY_CARE_PROVIDER_SITE_OTHER): Payer: Medicare Other | Admitting: Physical Therapy

## 2016-11-23 DIAGNOSIS — M6281 Muscle weakness (generalized): Secondary | ICD-10-CM

## 2016-11-23 DIAGNOSIS — M25662 Stiffness of left knee, not elsewhere classified: Secondary | ICD-10-CM

## 2016-11-23 DIAGNOSIS — M25661 Stiffness of right knee, not elsewhere classified: Secondary | ICD-10-CM

## 2016-11-23 NOTE — Therapy (Addendum)
[facility] [place], [place], [address] Phone: [phone]   Fax:  [phone]  Physical Therapy Treatment  Patient Details  Name: Shun Pletz MRN: [id number] Date of Birth: 02-23-1941 Referring Provider: Dr. Delice Lesch   Encounter Date: 11/23/2016      PT End of Session - 11/23/16 1457    Visit Number 15   Number of Visits 18   Date for PT Re-Evaluation 12/01/16   Authorization Time Period G-codes at 2023-01-11 visit   PT Start Time 1450   PT Stop Time 1546   PT Time Calculation (min) 56 min   Activity Tolerance Patient tolerated treatment well   Behavior During Therapy [facility] for tasks assessed/performed      Past Medical History:  Diagnosis Date  . Arthritis   . Closed fracture of left elbow with routine healing   . Hairy cell leukemia ([place]) 1990   due to agent orange.   . Impingement syndrome of left shoulder   . Measles   . Mumps     Past Surgical History:  Procedure Laterality Date  . 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 TONSILLECTOMY    . TOTAL KNEE ARTHROPLASTY Bilateral 10/04/2016   Procedure: BILATERAL TOTAL KNEE ARTHROPLASTY;  Surgeon: Gaynelle Arabian, MD;  Location: [facility];  Service: Orthopedics;  Laterality: Bilateral;    There were no vitals filed for this visit.      Subjective Assessment - 11/23/16 1458    Subjective Pt reports no new changes since last visit.     Currently in Pain? No/denies   Pain Score 0-No pain            [facility] PT Assessment - 11/23/16 0001      Assessment   Medical Diagnosis bilateral TKA   Referring Provider Dr. Delice Lesch    Onset Date/Surgical Date 10/04/16     Strength   Right/Left Hip Right;Left   Right Hip Extension 4+/5   Right Hip ABduction 5/5   Left Hip Flexion 5/5   Left Hip Extension 4/5   Left Hip ABduction 5/5   Right/Left Knee Right;Left   Right Knee Flexion --  5-/5   Right Knee Extension 5/5   Left  Knee Flexion 4+/5   Left Knee Extension 5/5     Flexibility   Soft Tissue Assessment /Muscle Length yes   Quadriceps ~ 90 deg bilat knee in prone            [facility] Adult PT Treatment/Exercise - 11/23/16 0001      Knee/Hip Exercises: Stretches   Passive Hamstring Stretch Right;Left;2 reps;30 seconds  with overpressure from UE.    Gastroc Stretch Right;Left;2 reps;30 seconds     Knee/Hip Exercises: Aerobic   Tread Mill backward _0 .8 x 1.5 min.    Recumbent Bike L4: 6 min      Knee/Hip Exercises: Standing   Heel Raises Both;1 set;10 reps   Forward Step Up Right;Left;2 sets;5 reps;Hand Hold: 1  13" step to simulate ladder   Functional Squat 10 reps;1 set  touching 10" stool with hands    SLS on 3" pad:   trials each leg.    Other Standing Knee Exercises High kneeling on 3" pad with attempts to sit back towards heels x 3 reps, then standing up from this position .   Other Standing Knee Exercises heel toe walk forward/backward (improved)     Moist Heat Therapy   Number Minutes  Moist Heat 12 Minutes   Moist Heat Location Knee  bilat posterior     Cryotherapy   Number Minutes Cryotherapy 12 Minutes   Cryotherapy Location Knee  bilat anterior    Type of Cryotherapy Ice pack                     PT Long Term Goals - 11/13/16 1318      PT LONG TERM GOAL #1   Title Pt to be independent with HEP for strengthening, ROM, and balance. (12/01/16)   Time 6   Period Weeks   Status On-going     PT LONG TERM GOAL #2   Title Pt to have 0 degrees of Rt knee extension for gait stability. (12/01/16)   Time 6   Period Weeks   Status On-going     PT LONG TERM GOAL #3   Title Pt to have 0 degrees of Lt knee extension for gait stability. (12/01/16)   Time 6   Period Weeks   Status On-going     PT LONG TERM GOAL #4   Title Pt to ambulate without assistive device, community distances. (12/01/16)   Status Achieved     PT LONG TERM GOAL #5   Title Pt to demonstrate Rt  knee flexion to =/> 115 degrees for squatting tasks. (12/01/16)   Status Achieved     PT LONG TERM GOAL #6   Title Pt to demonstrate Lt knee flexion to =/> 115 degrees for squatting tasks. (12/01/16)   Status Achieved               Plan - 11/23/16 1524    Clinical Impression Statement Pt's standing balance (static/dynamic) improving.  His BLE strength has improved as well. He continues with tightness in bilat hamstrings and quads.  He is nearing meeting remaining goals.    Rehab Potential Good   PT Frequency 2x / week   PT Duration 6 weeks   PT Treatment/Interventions ADLs/Self Care Home Management;Cryotherapy;Electrical Stimulation;Iontophoresis 38m/ml Dexamethasone;Moist Heat;Traction;Ultrasound;Gait training;Stair training;Therapeutic activities;Therapeutic exercise;Patient/family education;Manual techniques;Taping;Vasopneumatic Device   PT Next Visit Plan Continue progressive ROM/strengthening as tolerated.    Consulted and Agree with Plan of Care Patient      Patient will benefit from skilled therapeutic intervention in order to improve the following deficits and impairments:  Abnormal gait, Decreased activity tolerance, Decreased balance, Decreased mobility, Decreased strength, Increased edema, Impaired flexibility, Pain, Increased muscle spasms, Decreased endurance, Decreased range of motion, Difficulty walking  Visit Diagnosis: Muscle weakness (generalized)  Stiffness of right knee, not elsewhere classified  Stiffness of left knee, not elsewhere classified     Problem List Patient Active Problem List   Diagnosis Date Noted  . Drug-induced skin rash   . Rash and nonspecific skin eruption   . Heat rash   . Intermittent fever of unknown origin   . Post-operative pain   . Acute blood loss anemia   . Hypokalemia   . Constipation due to pain medication   . SIRS (systemic inflammatory response syndrome) (HCC)   . S/P TKR (total knee replacement), bilateral  10/07/2016  . Postoperative anemia due to acute blood loss 10/07/2016  . Leukocytosis 10/07/2016  . Primary osteoarthritis of knees, bilateral 10/07/2016  . OA (osteoarthritis) of knee 10/04/2016  . Primary osteoarthritis of both knees 01/14/2016   JKerin Perna PTA 11/23/16 3:34 PM  [address] [address]Phone: [phone]  Fax:  [phone]  Name: Hisao Doo MRN: [id number] Date of Birth: [birth year]  PHYSICAL THERAPY DISCHARGE SUMMARY  Visits from Start of Care: 15  Current functional level related to goals / functional outcomes: Excellent progress. See note for discharge status. Independent in HEP and pleased with progress.    Remaining deficits: Needs to continue with HEP to gain strength; maintain ROM and improve function.   Education / Equipment: HEP Plan: Patient agrees to discharge.  Patient goals were met. Patient is being discharged due to meeting the stated rehab goals.  ?????     Celyn P. Helene Kelp PT, MPH 12/13/16 4:36 PM

## 2016-11-28 ENCOUNTER — Encounter: Payer: Medicare Other | Admitting: Physical Therapy

## 2016-11-30 ENCOUNTER — Encounter: Payer: Medicare Other | Admitting: Rehabilitative and Restorative Service Providers"

## 2017-01-25 ENCOUNTER — Encounter: Payer: Medicare Other | Attending: Physical Medicine & Rehabilitation | Admitting: Physical Medicine & Rehabilitation

## 2017-01-25 ENCOUNTER — Encounter: Payer: Self-pay | Admitting: Physical Medicine & Rehabilitation

## 2017-01-25 VITALS — BP 149/75 | HR 84 | Resp 14

## 2017-01-25 DIAGNOSIS — G8918 Other acute postprocedural pain: Secondary | ICD-10-CM

## 2017-01-25 DIAGNOSIS — D72829 Elevated white blood cell count, unspecified: Secondary | ICD-10-CM | POA: Diagnosis not present

## 2017-01-25 DIAGNOSIS — Z96653 Presence of artificial knee joint, bilateral: Secondary | ICD-10-CM | POA: Diagnosis not present

## 2017-01-25 DIAGNOSIS — M170 Bilateral primary osteoarthritis of knee: Secondary | ICD-10-CM | POA: Insufficient documentation

## 2017-01-25 DIAGNOSIS — D62 Acute posthemorrhagic anemia: Secondary | ICD-10-CM | POA: Diagnosis not present

## 2017-01-25 NOTE — Progress Notes (Signed)
Subjective:    Patient ID: Stephen Nixon, male    DOB: 1941-06-28, 76 y.o.   MRN: [id number]  HPI 76 year old male with bilateral knee OA with pain presents for follow up for b/l TKR on 10/04/16.  Last clinic visit 10/25/17.  Since that time, he has followed up with Ortho.  His edema and ROM have improved.  His pain has resolved. He has improved with some complaints of stiffness after initially standing.   His bowel movements are normal.  His rash has resolved.  He has completed therapies and now doing HEP.    Pain Inventory Average Pain 0 Pain Right Now 0 My pain is no pain  In the last 24 hours, has pain interfered with the following? General activity 0 Relation with others 0 Enjoyment of life 0 What TIME of day is your pain at its worst? no pain Sleep (in general) no pain  Pain is worse with: no pain Pain improves with: no pain Relief from Meds: no pain  Mobility walk without assistance ability to climb steps?  yes do you drive?  yes Do you have any goals in this area?  yes  Function retired  Neuro/Psych No problems in this area trouble walking  Prior Studies Any changes since last visit?  no  Physicians involved in your care Any changes since last visit?  no   Family History  Problem Relation Age of Onset  . Cancer - Other Mother   . Healthy Father    Social History   Social History  . Marital status: Married    Spouse name: N/A  . Number of children: N/A  . Years of education: N/A   Social History Main Topics  . Smoking status: Former Smoker    Types: Cigarettes    Quit date: 09/18/1967  . Smokeless tobacco: Never Used  . Alcohol use 0.0 oz/week     Comment: occassionally  . Drug use: No  . Sexual activity: Not Asked   Other Topics Concern  . None   Social History Narrative  . None   Past Surgical History:  Procedure Laterality Date  . 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 TONSILLECTOMY    . TOTAL KNEE ARTHROPLASTY  Bilateral 10/04/2016   Procedure: BILATERAL TOTAL KNEE ARTHROPLASTY;  Surgeon: Gaynelle Arabian, MD;  Location: [facility];  Service: Orthopedics;  Laterality: Bilateral;   Past Medical History:  Diagnosis Date  . Arthritis   . Closed fracture of left elbow with routine healing   . Hairy cell leukemia ([place]) 1990   due to agent orange.   . Impingement syndrome of left shoulder   . Measles   . Mumps    BP (!) 149/75   Pulse 84   Resp 14   SpO2 95%   Opioid Risk Score:   Fall Risk Score:  `1  Depression screen PHQ 2/9  Depression screen [facility] 2/9 10/25/2016 03/23/2016 01/13/2016 01/13/2016  Decreased Interest 0 0 0 0  Down, Depressed, Hopeless 0 0 0 0  PHQ - 2 Score 0 0 0 0  Altered sleeping 1 - - -  Tired, decreased energy 1 - - -  Change in appetite 1 - - -  Feeling bad or failure about yourself  0 - - -  Trouble concentrating 1 - - -  Moving slowly or fidgety/restless 0 - - -  Suicidal thoughts 0 - - -  PHQ-9 Score 4 - - -  Review of Systems  Constitutional: Negative.   HENT: Negative.   Eyes: Negative.   Respiratory: Negative.   Cardiovascular: Negative.   Gastrointestinal: Negative.   Endocrine: Negative.   Genitourinary: Negative.   Musculoskeletal: Negative.   Skin: Negative.   Allergic/Immunologic: Negative.   Neurological: Positive for numbness.  Hematological: Negative.   Psychiatric/Behavioral: Negative.   All other systems reviewed and are negative.     Objective:   Physical Exam Constitutional: He appears well-developed and well-nourished. NAD. Vital signs reviewed.  HENT: Normocephalic and atraumatic.  Eyes: EOMI. No discharge.  Cardiovascular: RRR. No JVD. Respiratory: Effort normal and breath sounds normal.  GI: Soft. Bowel sounds are normal.   Musculoskeletal: He exhibits no edema and no tenderness.  Neurological: He is alert and oriented. Motor; 5/5 throughout Skin: Healed incisions.  Psychiatric: He has a normal mood and affect. His behavior is  normal.     Assessment & Plan:  76 year old male with bilateral knee OA with pain presents for follow up for b/l TKR on 10/04/16.  1. OA of bilateral knees s/p bilateral TKA's.  Completed therapy, cont HEP.  Cont follow up Ortho  2. Pain Management  Well controlled at present  3.  ABLA/Leukocytosis/Hypokalemia  He saw his PCP and lab work was performed  4. Fevers  Resolved  5. Opoid induced Constipation  Now off of opiods and constipation resolved  6. Gait abnormality  Resolved

## 2017-07-27 IMAGING — CR DG CHEST 2V
2 series · 4 of 4 positions shown · non-contrast
Comparison: None.

CLINICAL DATA: Elevated temperature and heart rate

EXAM:
CHEST  2 VIEW

[Series 2: chest lat · 0.14mm/px · 2 of 2 slices shown]
[im 1/2]
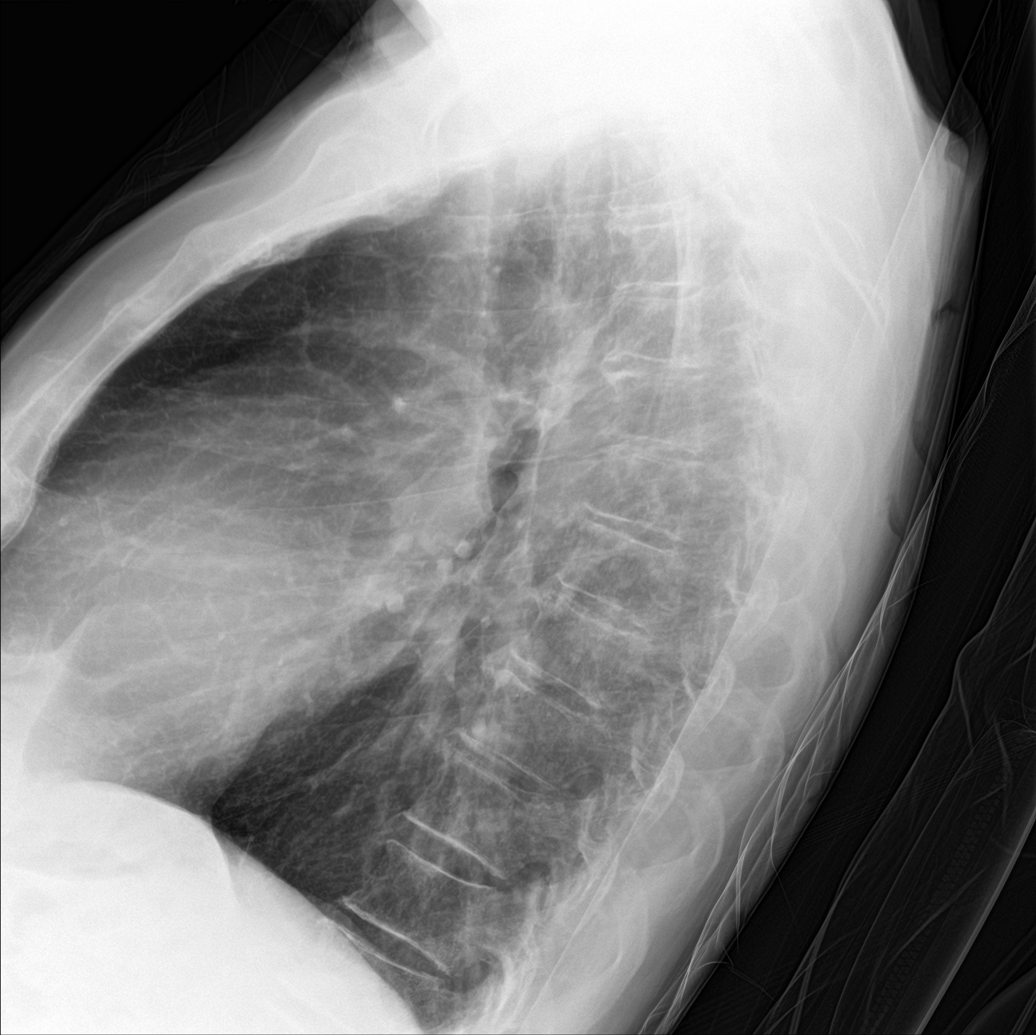
[im 2/2]
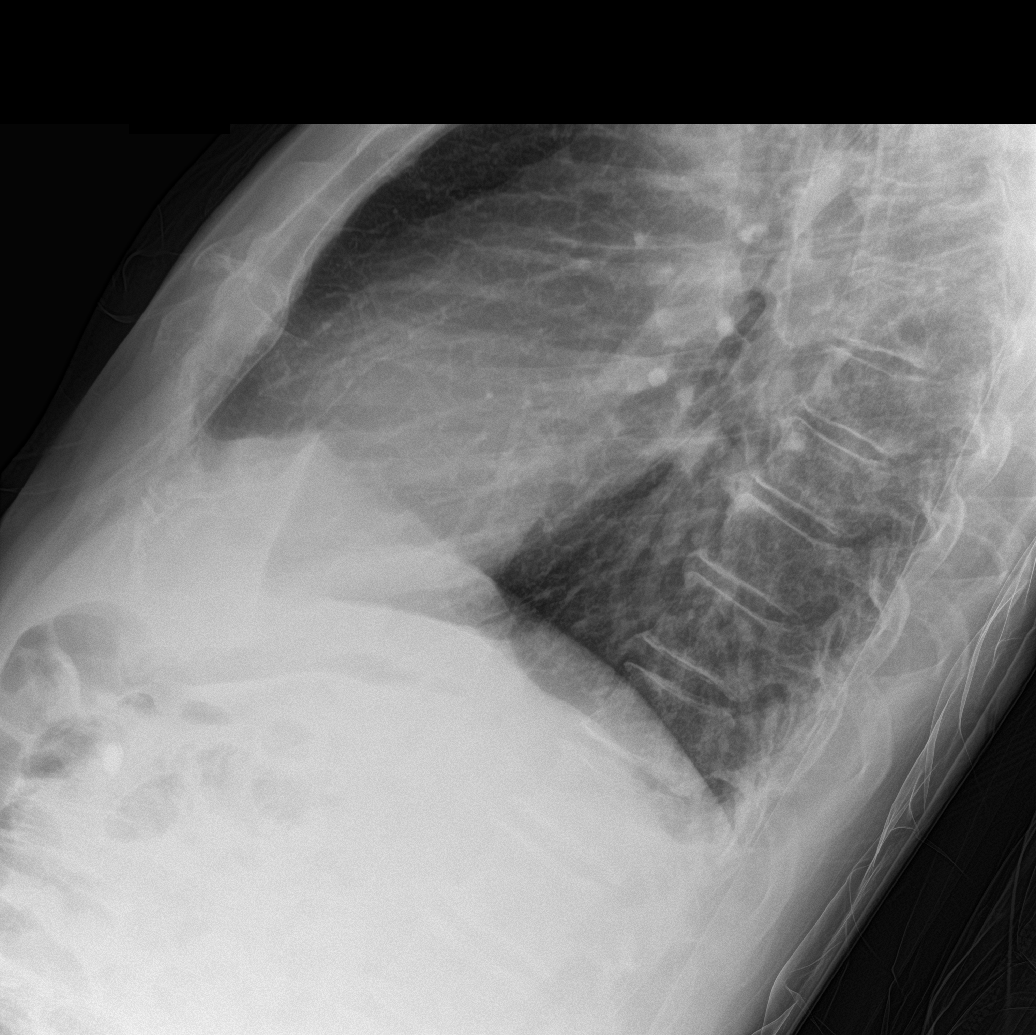

[Series 3: chest ap · 0.14mm/px · 2 of 2 slices shown]
[im 1/2]
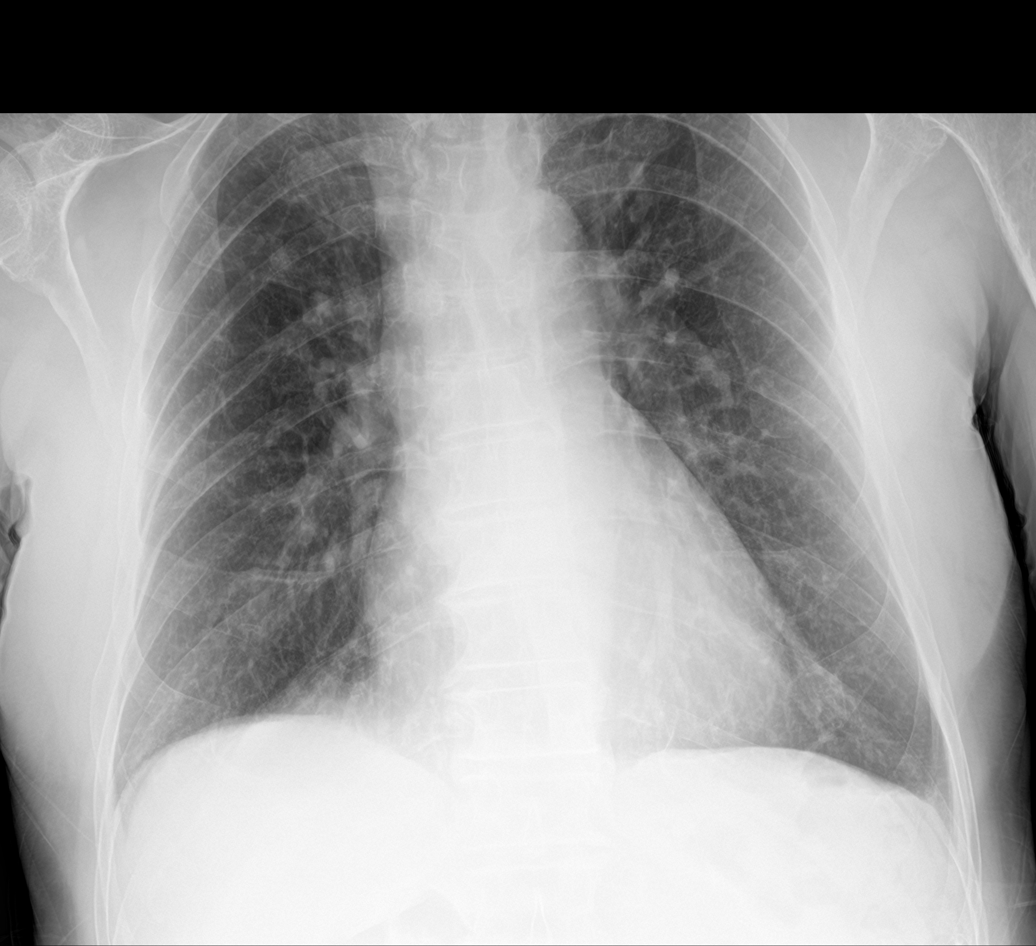
[im 2/2]
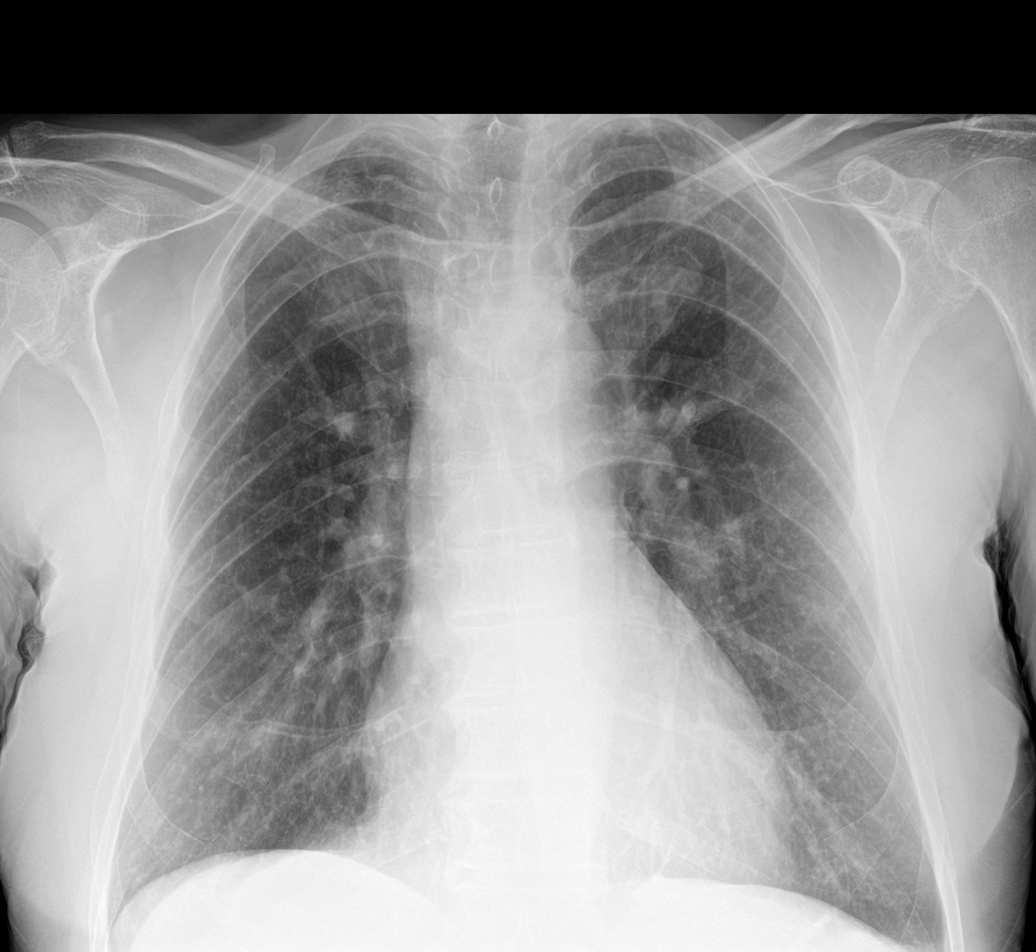

[4 of 4 positions shown; findings below may reference images not displayed]

FINDINGS: Mild diffuse interstitial prominence greatest in the lung bases,
suspect chronic change. No acute infiltrate or effusion. Borderline
heart size. No overt edema. No pneumothorax.
IMPRESSION: 1. No focal infiltrate
2. Borderline heart size
3. Prominent interstitial opacities could relate to chronic change.

## 2019-10-01 ENCOUNTER — Encounter: Payer: Self-pay | Admitting: Family Medicine

## 2019-10-01 ENCOUNTER — Ambulatory Visit (INDEPENDENT_AMBULATORY_CARE_PROVIDER_SITE_OTHER): Payer: Medicare Other | Admitting: Family Medicine

## 2019-10-01 ENCOUNTER — Other Ambulatory Visit: Payer: Self-pay

## 2019-10-01 VITALS — BP 110/76 | HR 76 | Temp 96.7°F | Resp 12 | Ht 70.0 in | Wt 152.4 lb

## 2019-10-01 DIAGNOSIS — E559 Vitamin D deficiency, unspecified: Secondary | ICD-10-CM | POA: Diagnosis not present

## 2019-10-01 DIAGNOSIS — E538 Deficiency of other specified B group vitamins: Secondary | ICD-10-CM | POA: Diagnosis not present

## 2019-10-01 DIAGNOSIS — R5383 Other fatigue: Secondary | ICD-10-CM

## 2019-10-01 DIAGNOSIS — D649 Anemia, unspecified: Secondary | ICD-10-CM | POA: Diagnosis not present

## 2019-10-01 LAB — COMPREHENSIVE METABOLIC PANEL
ALT: 14 U/L (ref 0–53)
AST: 19 U/L (ref 0–37)
Albumin: 4.1 g/dL (ref 3.5–5.2)
Alkaline Phosphatase: 93 U/L (ref 39–117)
BUN: 14 mg/dL (ref 6–23)
CO2: 29 mEq/L (ref 19–32)
Calcium: 9.4 mg/dL (ref 8.4–10.5)
Chloride: 103 mEq/L (ref 96–112)
Creatinine, Ser: 0.79 mg/dL (ref 0.40–1.50)
GFR: 95.12 mL/min (ref >60.00)
Glucose, Bld: 91 mg/dL (ref 70–99)
Potassium: 4.1 mEq/L (ref 3.5–5.1)
Sodium: 140 mEq/L (ref 135–145)
Total Bilirubin: 0.9 mg/dL (ref 0.2–1.2)
Total Protein: 6.8 g/dL (ref 6.0–8.3)

## 2019-10-01 LAB — TSH: TSH: 1.53 u[IU]/mL (ref 0.35–4.50)

## 2019-10-01 LAB — CBC
HCT: 43.4 % (ref 39.0–52.0)
Hemoglobin: 14.6 g/dL (ref 13.0–17.0)
MCHC: 33.7 g/dL (ref 30.0–36.0)
MCV: 95.3 fl (ref 78.0–100.0)
Platelets: 288 10*3/uL (ref 150.0–400.0)
RBC: 4.56 Mil/uL (ref 4.22–5.81)
RDW: 13 % (ref 11.5–15.5)
WBC: 8.3 10*3/uL (ref 4.0–10.5)

## 2019-10-01 LAB — IBC + FERRITIN
Ferritin: 93.7 ng/mL (ref 22.0–322.0)
Iron: 135 ug/dL (ref 42–165)
Saturation Ratios: 39.7 % (ref 20.0–50.0)
Transferrin: 243 mg/dL (ref 212.0–360.0)

## 2019-10-01 LAB — VITAMIN D 25 HYDROXY (VIT D DEFICIENCY, FRACTURES): VITD: 50.71 ng/mL (ref 30.00–100.00)

## 2019-10-01 LAB — VITAMIN B12: Vitamin B-12: 605 pg/mL (ref 211–911)

## 2019-10-01 LAB — T4, FREE: Free T4: 0.87 ng/dL (ref 0.60–1.60)

## 2019-10-01 NOTE — Progress Notes (Signed)
Chief Complaint  Patient presents with  . Establish Care  . wants to talk about getting b12 injection       New Patient Visit SUBJECTIVE: HPI: Stephen Nixon is an 78 y.o.male who is being seen for establishing care.  Over past couple years, has been having daytime fatigue. Reports getting normal sleep, around 6-7 hrs nightly.  No snoring or apneic episodes. Hx of anemia. Mood normal overall. Wt has been pretty stable. Diet is healthy. Tries to exercise routinely doing push ups and pull ups. No CP, SOB, pain, blood in stool/urine, diarrhea. Takes B12, interested in injections.   Allergies  Allergen Reactions  . Tylenol [Acetaminophen] Rash    Past Medical History:  Diagnosis Date  . Arthritis   . Closed fracture of left elbow with routine healing   . Hairy cell leukemia ([place]) 1990   due to agent orange.   . Impingement syndrome of left shoulder   . Measles   . Mumps    Past Surgical History:  Procedure Laterality Date  . 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 TONSILLECTOMY    . TOTAL KNEE ARTHROPLASTY Bilateral 10/04/2016   Procedure: BILATERAL TOTAL KNEE ARTHROPLASTY;  Surgeon: Gaynelle Arabian, MD;  Location: [facility];  Service: Orthopedics;  Laterality: Bilateral;   Family History  Problem Relation Age of Onset  . Cancer - Other Mother   . Healthy Father    Allergies  Allergen Reactions  . Tylenol [Acetaminophen] Rash    Current Outpatient Medications:  .  Cyanocobalamin (VITAMIN B-12 PO), Take 1 tablet by mouth daily., Disp: , Rfl:   ROS Cardiovascular: Denies chest pain  Respiratory: Denies dyspnea   OBJECTIVE: BP 110/76 (BP Location: Left Arm, Cuff Size: Normal)   Pulse 76   Temp (!) 96.7 F (35.9 C) (Oral)   Resp 12   Ht 5\' 10"  (1.778 m)   Wt 152 lb 6.4 oz (69.1 kg)   SpO2 97%   BMI 21.87 kg/m   Constitutional: -  VS reviewed -  Well developed, well nourished, appears stated age -  No apparent distress  Psychiatric: -  Oriented to  person, place, and time -  Memory intact -  Affect and mood normal -  Fluent conversation, good eye contact -  Judgment and insight age appropriate  Eye: -  Conjunctivae clear, no discharge -  Pupils symmetric, round, reactive to light  ENMT: -  MMM    Pharynx moist, no exudate, no erythema  Neck: -  No gross swelling, no palpable masses -  Thyroid midline, not enlarged, mobile, no palpable masses  Cardiovascular: -  RRR -  No LE edema  Respiratory: -  Normal respiratory effort, no accessory muscle use, no retraction -  Breath sounds equal, no wheezes, no ronchi, no crackles  Gastrointestinal: -  Bowel sounds normal -  No tenderness, no distention, no guarding, no masses  Musculoskeletal: -  No clubbing, no cyanosis -  Gait normal  Skin: -  No significant lesion on inspection -  Warm and dry to palpation   ASSESSMENT/PLAN: Other fatigue - Plan: CBC, Comprehensive metabolic panel, TSH, T4, free  Anemia, unspecified type - Plan: B12, IBC + Ferritin  Vitamin D insufficiency - Plan: Vitamin D (25 hydroxy)  Low serum vitamin B12 - Plan: B12  Ck above. May be related to normal aging, which he is OK with, but wants to make sure it is nothing correctable. Also explained B12 injections not helpful  unless he is deficient.  Patient should return prn. The patient voiced understanding and agreement to the plan.   [place], DO 10/01/19  9:03 AM

## 2019-10-01 NOTE — Patient Instructions (Addendum)
Give [place] 2-3 business days to get the results of your labs back.   Keep the diet clean and stay active.  The new Shingrix vaccine (for shingles) is a 2 shot series. It can make people feel low energy, achy and almost like they have the flu for 48 hours after injection. Please plan accordingly when deciding on when to get this shot. Call your pharmacy to inquire about this. The second shot of the series is less severe regarding the side effects, but it still lasts 48 hours.   Let us know if you need anything.

## 2020-10-04 ENCOUNTER — Ambulatory Visit (INDEPENDENT_AMBULATORY_CARE_PROVIDER_SITE_OTHER): Payer: Medicare Other | Admitting: Family Medicine

## 2020-10-04 ENCOUNTER — Other Ambulatory Visit: Payer: Self-pay

## 2020-10-04 ENCOUNTER — Encounter: Payer: Self-pay | Admitting: Family Medicine

## 2020-10-04 VITALS — BP 132/76 | HR 67 | Temp 98.1°F | Ht 70.0 in | Wt 149.0 lb

## 2020-10-04 DIAGNOSIS — Z0000 Encounter for general adult medical examination without abnormal findings: Secondary | ICD-10-CM

## 2020-10-04 DIAGNOSIS — Z1159 Encounter for screening for other viral diseases: Secondary | ICD-10-CM | POA: Diagnosis not present

## 2020-10-04 NOTE — Patient Instructions (Signed)
Keep up the good work.   Keep the diet clean and stay active.  Give [place] 2-3 business days to get the results of your labs back.   Let us know if you need anything.

## 2020-10-04 NOTE — Progress Notes (Signed)
Subjective:    Stephen Nixon is a 79 y.o. male who presents for Medicare Annual/Subsequent preventive examination.   Preventive Screening-Counseling & Management  Tobacco Social History   Tobacco Use  Smoking Status Former Smoker  . Types: Cigarettes  . Quit date: 09/18/1967  . Years since quitting: 53.0  Smokeless Tobacco Never Used    Problems Prior to Visit 1. OA 2. Hx of hairy cell leukemia  Current Problems (verified) Patient Active Problem List   Diagnosis Date Noted  . Drug-induced skin rash   . Rash and nonspecific skin eruption   . Heat rash   . Intermittent fever of unknown origin   . Post-operative pain   . Acute blood loss anemia   . Hypokalemia   . Constipation due to pain medication   . SIRS (systemic inflammatory response syndrome) (HCC)   . S/P TKR (total knee replacement), bilateral 10/07/2016  . Postoperative anemia due to acute blood loss 10/07/2016  . Leukocytosis 10/07/2016  . Primary osteoarthritis of knees, bilateral 10/07/2016  . OA (osteoarthritis) of knee 10/04/2016  . Primary osteoarthritis of both knees 01/14/2016    Medications Prior to Visit Current Outpatient Medications on File Prior to Visit  Medication Sig Dispense Refill  . Cyanocobalamin (VITAMIN B-12 PO) Take 1 tablet by mouth daily.      Current Medications (verified) Current Outpatient Medications  Medication Sig Dispense Refill  . Cyanocobalamin (VITAMIN B-12 PO) Take 1 tablet by mouth daily.     Allergies (verified) Tylenol [acetaminophen]   PAST HISTORY  Family History Family History  Problem Relation Age of Onset  . Cancer - Other Mother   . Alcohol abuse Father   . Alcohol abuse Brother   . Alcohol abuse Maternal Grandfather   . Alcohol abuse Son     Social History Social History   Tobacco Use  . Smoking status: Former Smoker    Types: Cigarettes    Quit date: 09/18/1967    Years since quitting: 53.0  . Smokeless tobacco: Never Used  Substance Use  Topics  . Alcohol use: Yes    Alcohol/week: 0.0 standard drinks    Comment: occassionally    Are there smokers in your home (other than you)?  No  Risk Factors Current exercise habits: chin ups, walking, push ups  Dietary issues discussed: No, he is doing well   Cardiac risk factors: advanced age (older than 25 for men, 77 for women) and male gender.  Depression Screen (Note: if answer to either of the following is "Yes", a more complete depression screening is indicated)   Q1: Over the past two weeks, have you felt down, depressed or hopeless? No  Q2: Over the past two weeks, have you felt little interest or pleasure in doing things? No  Have you lost interest or pleasure in daily life? No  Do you often feel hopeless? No  Do you cry easily over simple problems? No  Activities of Daily Living In your present state of health, do you have any difficulty performing the following activities?:  Driving? No Managing money?  No Feeding yourself? No Getting from bed to chair? No Climbing a flight of stairs? No Preparing food and eating?: No Bathing or showering? No Getting dressed: No Getting to the toilet? No Using the toilet:No Moving around from place to place: No In the past year have you fallen or had a near fall?:No   Are you sexually active?  No  Do you have more than one partner?  N/A  Hearing Difficulties: Yes Do you often ask people to speak up or repeat themselves? Sometimes Do you experience ringing or noises in your ears? No Do you have difficulty understanding soft or whispered voices? Yes   Do you feel that you have a problem with memory? No  Do you often misplace items? Yes  Do you feel safe at home?  Yes  Cognitive Testing  Alert? Yes  Normal Appearance?Yes  Oriented to person? Yes  Place? Yes   Time? Yes  Recall of three objects?  Yes  Can perform simple calculations? Yes  Displays appropriate judgment?Yes  Can read the correct time from a watch  face?Yes   Advanced Directives have been discussed with the patient? Yes   List the Names of Other Physician/Practitioners you currently use: 1.  N/A  Indicate any recent Medical Services you may have received from other than [facility] providers in the past year (date may be approximate).  Immunization History  Administered Date(s) Administered  . Influenza, High Dose Seasonal PF 09/17/2019  . Influenza-Unspecified 08/19/2020  . Moderna SARS-COVID-2 Vaccination 01/24/2020, 02/21/2020  . Pneumococcal Conjugate-13 11/11/2018  . Pneumococcal Polysaccharide-23 04/30/2008  . Tdap 11/11/2018    Screening Tests Health Maintenance  Topic Date Due  . Hepatitis C Screening  Never done  . TETANUS/TDAP  11/11/2028  . INFLUENZA VACCINE  Completed  . COVID-19 Vaccine  Completed  . PNA vac Low Risk Adult  Completed    All answers were reviewed with the patient and necessary referrals were made:  Shelda Pal, DO   10/04/2020   History reviewed: allergies, current medications, past family history, past medical history, past social history, past surgical history and problem list  Review of Systems Pertinent items are noted in HPI.    Objective:     Vision by Snellen chart: right eye:20/50, left eye:20/40; following with ophtho for cataracts  Blood pressure 132/76, pulse 67, temperature 98.1 F (36.7 C), temperature source Oral, height 5\' 10"  (1.778 m), weight 149 lb (67.6 kg), SpO2 98 %. Body mass index is 21.38 kg/m.  BP 132/76 (BP Location: Left Arm, Patient Position: Sitting, Cuff Size: Normal)   Pulse 67   Temp 98.1 F (36.7 C) (Oral)   Ht 5\' 10"  (1.778 m)   Wt 149 lb (67.6 kg)   SpO2 98%   BMI 21.38 kg/m   General Appearance:    Alert, cooperative, no distress, appears stated age  Head:    Normocephalic, without obvious abnormality, atraumatic  Eyes:    PERRL, conjunctiva/corneas clear, EOM's intact, fundi    benign, both eyes       Ears:    Normal TM's and  external ear canals, both ears  Nose:   Nares normal, septum midline, mucosa normal, no drainage    or sinus tenderness  Throat:   Lips, mucosa, and tongue normal; teeth and gums normal  Neck:   Supple, symmetrical, trachea midline, no adenopathy;       thyroid:  No enlargement/tenderness/nodules; no carotid   bruit or JVD  Back:     Symmetric, no curvature, ROM normal, no CVA tenderness  Lungs:     Clear to auscultation bilaterally, respirations unlabored  Chest wall:    No tenderness or deformity  Heart:    Regular rate and rhythm, S1 and S2 normal, no murmur, rub   or gallop  Abdomen:     Soft, non-tender, bowel sounds active all four quadrants,    no masses, no organomegaly  Genitalia:    Normal male without lesion, discharge or tenderness  Rectal:    Normal tone, normal prostate, no masses or tenderness;   guaiac negative stool  Extremities:   Extremities normal, atraumatic, no cyanosis or edema  Pulses:   2+ and symmetric all extremities  Skin:   Skin color, texture, turgor normal, no rashes or lesions  Lymph nodes:   Cervical, supraclavicular, and axillary nodes normal  Neurologic:   CNII-XII intact. Normal strength, sensation and reflexes      throughout       Assessment:     Medicare annual wellness visit, subsequent  Encounter for hepatitis C screening test for low risk patient - Plan: Hepatitis C antibody      Plan:     During the course of the visit the patient was educated and counseled about appropriate screening and preventive services including:    Nutrition counseling   Diet review for nutrition referral? Not Indicated   Patient Instructions (the written plan) was given to the patient.  Medicare Attestation I have personally reviewed: The patient's medical and social history Their use of alcohol, tobacco or illicit drugs Their current medications and supplements The patient's functional ability including ADLs,fall risks, home safety risks,  cognitive, and hearing and visual impairment Diet and physical activities Evidence for depression or mood disorders  The patient's weight, height, BMI, and visual acuity have been recorded in the chart.  I have made referrals, counseling, and provided education to the patient based on review of the above and I have provided the patient with a written personalized care plan for preventive services.     [place], DO   10/04/2020

## 2020-10-06 ENCOUNTER — Ambulatory Visit: Payer: Medicare Other | Admitting: Family Medicine

## 2021-10-19 ENCOUNTER — Encounter: Payer: Self-pay | Admitting: Family Medicine

## 2021-10-19 ENCOUNTER — Ambulatory Visit (INDEPENDENT_AMBULATORY_CARE_PROVIDER_SITE_OTHER): Payer: Medicare Other | Admitting: Family Medicine

## 2021-10-19 ENCOUNTER — Other Ambulatory Visit: Payer: Self-pay

## 2021-10-19 VITALS — BP 128/82 | HR 67 | Temp 98.5°F | Ht 70.0 in | Wt 149.2 lb

## 2021-10-19 DIAGNOSIS — K4090 Unilateral inguinal hernia, without obstruction or gangrene, not specified as recurrent: Secondary | ICD-10-CM | POA: Diagnosis not present

## 2021-10-19 NOTE — Progress Notes (Signed)
Chief Complaint  Patient presents with   Hearing Problem    Stephen Nixon is a 80 y.o. male here for a lump in his groin.  Duration: 1 week Location: L groin Comes and goes.  Pruritic? No Painful? No Drainage? No Sick contacts? No Other associated symptoms: no bruising, redness,  Therapies tried thus far: none  Past Medical History:  Diagnosis Date   Arthritis    Hairy cell leukemia ([facility]) 1990   due to agent orange.    Impingement syndrome of left shoulder     BP 128/82   Pulse 67   Temp 98.5 F (36.9 C) (Oral)   Ht 5\' 10"  (1.778 m)   Wt 149 lb 4 oz (67.7 kg)   SpO2 98%   BMI 21.42 kg/m  Gen: awake, alert, appearing stated age Lungs: No accessory muscle use Skin: no gross deformity. No hernia on R, small hernia that increases in size w coughing on L. No TTP, erythema, fluctuance, excoriation, vesiculation Psych: Age appropriate judgment and insight  Non-recurrent unilateral inguinal hernia without obstruction or gangrene  Reassurance for now. Tylenol if he ever were to have pain. He will let me know if any new s/s's arise. Will refer to gen surg if that is the case. F/u prn. The patient voiced understanding and agreement to the plan.  [place], DO 10/19/21 9:08 AM

## 2021-10-19 NOTE — Patient Instructions (Signed)
Let me know if anything changes.

## 2021-12-13 ENCOUNTER — Ambulatory Visit: Payer: Medicare Other | Admitting: Family Medicine

## 2022-05-03 ENCOUNTER — Telehealth: Payer: Self-pay | Admitting: Family Medicine

## 2022-05-03 NOTE — Telephone Encounter (Signed)
Left message for patient to call back and schedule Medicare Annual Wellness Visit (AWV).   Please offer to do virtually or by telephone.  Left office number and my jabber #[phone].  Last AWV:10/04/2020  Please schedule at anytime with Nurse Health Advisor.   

## 2022-05-09 ENCOUNTER — Encounter: Payer: Self-pay | Admitting: Family Medicine

## 2022-08-07 ENCOUNTER — Ambulatory Visit (INDEPENDENT_AMBULATORY_CARE_PROVIDER_SITE_OTHER): Payer: Medicare Other | Admitting: Family Medicine

## 2022-08-07 ENCOUNTER — Encounter: Payer: Self-pay | Admitting: Family Medicine

## 2022-08-07 VITALS — BP 114/72 | HR 65 | Temp 98.2°F | Ht 70.0 in | Wt 153.4 lb

## 2022-08-07 DIAGNOSIS — K4090 Unilateral inguinal hernia, without obstruction or gangrene, not specified as recurrent: Secondary | ICD-10-CM | POA: Diagnosis not present

## 2022-08-07 NOTE — Patient Instructions (Signed)
If you do not hear anything about your referral in the next 1-2 weeks, call our office and ask for an update.  If you have increasing pain without reason, seek immediate care.  Let us know if you need anything.

## 2022-08-07 NOTE — Progress Notes (Signed)
Chief Complaint  Patient presents with   Hernia    Referral     Stephen Nixon is a 81 y.o. male here for a skin complaint.  Duration: 10 months Location: Left inguinal region Pruritic? No Painful?  Sometimes when something hits it Drainage? No He feels that it is getting bigger since I last evaluated this in November. Other associated symptoms: No color changes, testicular pain, fevers, or bowel changes Therapies tried thus far: None  Past Medical History:  Diagnosis Date   Arthritis    Hairy cell leukemia ([place]) 1990   due to agent orange.    Impingement syndrome of left shoulder     BP 114/72   Pulse 65   Temp 98.2 F (36.8 C) (Oral)   Ht '5\' 10"'$  (1.778 m)   Wt 153 lb 6 oz (69.6 kg)   SpO2 98%   BMI 22.01 kg/m  Gen: awake, alert, appearing stated age Lungs: No accessory muscle use Skin: There is a gross bulge on the left inguinal region that is not tender to palpation.  It does enlarge upon Valsalva.  There is no appreciable hernia on the right. No drainage, erythema, TTP, fluctuance, excoriation Psych: Age appropriate judgment and insight  Left inguinal hernia - Plan: Ambulatory referral to General Surgery  No signs of incarceration, but we will refer to general surgery for further evaluation.  Warning signs and symptoms verbalized and written down. F/u prn. The patient voiced understanding and agreement to the plan.  [place], DO 08/07/22 4:11 PM

## 2023-08-31 ENCOUNTER — Telehealth: Payer: Self-pay | Admitting: Family Medicine

## 2023-08-31 NOTE — Telephone Encounter (Signed)
Pt called stating that he had an encounter with a rusty nail on 9.19.24. After reviewing chart, noted that pt IS up to date on his tDap vaccine with the last one administered on 12.2.19. Please advise if pt needs any further action regarding this matter.

## 2023-08-31 NOTE — Telephone Encounter (Signed)
Called to inform is up to date last one 11/2018. He is healing well now and if anything changes will call to schedule an appointment

## 2023-10-30 ENCOUNTER — Encounter: Payer: Self-pay | Admitting: Family Medicine

## 2023-10-30 ENCOUNTER — Telehealth (INDEPENDENT_AMBULATORY_CARE_PROVIDER_SITE_OTHER): Payer: Medicare Other | Admitting: Family Medicine

## 2023-10-30 VITALS — BP 124/67 | Temp 98.5°F

## 2023-10-30 DIAGNOSIS — U071 COVID-19: Secondary | ICD-10-CM

## 2023-10-30 MED ORDER — PREDNISONE 20 MG PO TABS: 40.0000 mg | ORAL_TABLET | Freq: Every day | ORAL | 0 refills | Status: AC

## 2023-10-30 MED ORDER — PROMETHAZINE-DM 6.25-15 MG/5ML PO SYRP: 5.0000 mL | ORAL_SOLUTION | Freq: Four times a day (QID) | ORAL | 0 refills | Status: DC | PRN

## 2023-10-30 NOTE — Progress Notes (Signed)
CC: Coughing  Stephen Nixon here for URI complaints.We are interacting via web portal for an electronic face-to-face visit. I verified patient's ID using 2 identifiers. Patient agreed to proceed with visit via this method. Patient is at home, I am at office. Patient, his wife, and I are present for visit.   Duration:  12  days, slightly improved; dx'd w covid.  Associated symptoms: sinus congestion, fatigue, rhinorrhea, wheezing, and coughing Denies: sinus pain, itchy watery eyes, ear pain, ear drainage, sore throat, shortness of breath, myalgia, and fevers Treatment to date: [place] Sick contacts: No  Past Medical History:  Diagnosis Date   Arthritis    Hairy cell leukemia ([facility]) 1990   due to agent orange.    Impingement syndrome of left shoulder     Objective BP 124/67 (BP Location: Left Arm, Patient Position: Sitting, Cuff Size: Normal)   Temp 98.5 F (36.9 C)  No conversational dyspnea Age appropriate judgment and insight Nml affect and mood  COVID-19 - Plan: promethazine-dextromethorphan (PROMETHAZINE-DM) 6.25-15 MG/5ML syrup, predniSONE (DELTASONE) 20 MG tablet  5 d pred burst 40 mg/d for wheezing, cough syrup as above. Warned about drowsiness. Continue to push fluids, practice good hand hygiene, cover mouth when coughing. F/u prn. If starting to experience fevers, shaking, or shortness of breath, seek immediate care. Pt voiced understanding and agreement to the plan.  Jilda [place], DO 10/30/23 11:29 AM

## 2023-11-01 ENCOUNTER — Encounter: Payer: Self-pay | Admitting: Family Medicine

## 2024-01-18 ENCOUNTER — Other Ambulatory Visit: Payer: Self-pay

## 2024-05-21 ENCOUNTER — Telehealth: Payer: Self-pay | Admitting: Family Medicine

## 2024-05-21 NOTE — Telephone Encounter (Signed)
 Copied from CRM [id number]. Topic: Medicare AWV >> May 21, 2024  2:36 PM Juliana Ocean wrote: Reason for CRM: LVM 05/21/2024 to schedule AWV. Please schedule Virtual or Telehealth visits ONLY.   Rosalee Collins; Care Guide Ambulatory Clinical Support [facility] Direct Dial: [phone]

## 2025-01-16 ENCOUNTER — Ambulatory Visit: Admitting: Family Medicine

## 2025-01-16 ENCOUNTER — Encounter: Payer: Self-pay | Admitting: Family Medicine

## 2025-01-16 VITALS — BP 120/72 | HR 82 | Temp 98.0°F | Resp 16 | Ht 70.0 in | Wt 154.0 lb

## 2025-01-16 DIAGNOSIS — R21 Rash and other nonspecific skin eruption: Secondary | ICD-10-CM

## 2025-01-16 DIAGNOSIS — M542 Cervicalgia: Secondary | ICD-10-CM

## 2025-01-16 MED ORDER — TRIAMCINOLONE ACETONIDE 0.1 % EX CREA: 1.0000 | TOPICAL_CREAM | Freq: Two times a day (BID) | CUTANEOUS | 0 refills | Status: AC

## 2025-01-16 NOTE — Progress Notes (Signed)
 Chief Complaint  Patient presents with   Rash    Rash and Stiff Neck    Stephen Nixon is a 84 y.o. male here for a skin complaint.  Duration: 6 weeks Location: torso Pruritic? Yes- sometimes Painful? No Drainage? No New soaps/lotions/topicals/detergents? No Started after the flu.  Other associated symptoms: no fevers or spreading Therapies tried thus far: non-scented emollient  R side of neck has been achy over past 6 weeks after doing pushups and feeling a pull. Changed pillows but not getting better. Slowly getting better. Decreased R rotation. No bruising, redness, swelling. Associated pain in R shoulder that is also improving. Tried some heat with minimal relief.   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    Impingement syndrome of left shoulder     BP 120/72 (BP Location: Left Arm, Patient Position: Sitting)   Pulse 82   Temp 98 F (36.7 C) (Oral)   Resp 16   Ht 5' 10 (1.778 m)   Wt 154 lb (69.9 kg)   SpO2 98%   BMI 22.10 kg/m  Gen: awake, alert, appearing stated age Lungs: No accessory muscle use MSK: TTP over cerv parasp on R and lateral R neck msk Neuro: Neg Spurling's, grip strength adequate b/l, DTR's equal and symmetric in UE's b/l, no clonus Skin: excoriations and pink papules/macules over torso and UE's. No drainage, erythema, TTP, fluctuance Psych: Age appropriate judgment and insight  Neck pain  Rash - Plan: triamcinolone  cream (KENALOG ) 0.1 %  Heat, ice, Tylenol , stretches. PT if no better.  From flu? Kenalog  bid for 2 weeks. PO antihistamine. He sees derm, will f/u with them if no better.  F/u prn. The patient voiced understanding and agreement to the plan.  Mabel [place], DO 01/16/25 10:44 AM

## 2025-01-16 NOTE — Patient Instructions (Signed)
 Claritin (loratadine), Allegra (fexofenadine), Zyrtec (cetirizine) which is also equivalent to Xyzal (levocetirizine); these are listed in order from weakest to strongest. Generic, and therefore cheaper, options are in the parentheses.   There are available OTC, and the generic versions, which may be cheaper, are in parentheses. Show this to a pharmacist if you have trouble finding any of these items.  Heat (pad or rice pillow in microwave) over affected area, 10-15 minutes twice daily.   Ice/cold pack over area for 10-15 min twice daily.  OK to take Tylenol  1000 mg (2 extra strength tabs) or 975 mg (3 regular strength tabs) every 6 hours as needed.  Let us  know if you need anything.  EXERCISES RANGE OF MOTION (ROM) AND STRETCHING EXERCISES  These exercises may help you when beginning to rehabilitate your issue. In order to successfully resolve your symptoms, you must improve your posture. These exercises are designed to help reduce the forward-head and rounded-shoulder posture which contributes to this condition. Your symptoms may resolve with or without further involvement from your physician, physical therapist or athletic trainer. While completing these exercises, remember:  Restoring tissue flexibility helps normal motion to return to the joints. This allows healthier, less painful movement and activity. An effective stretch should be held for at least 20 seconds, although you may need to begin with shorter hold times for comfort. A stretch should never be painful. You should only feel a gentle lengthening or release in the stretched tissue. Do not do any stretch or exercise that you cannot tolerate.  STRETCH- Axial Extensors Lie on your back on the floor. You may bend your knees for comfort. Place a rolled-up hand towel or dish towel, about 2 inches in diameter, under the part of your head that makes contact with the floor. Gently tuck your chin, as if trying to make a double chin,  until you feel a gentle stretch at the base of your head. Hold 15-20 seconds. Repeat 2-3 times. Complete this exercise 1 time per day.   STRETCH - Axial Extension  Stand or sit on a firm surface. Assume a good posture: chest up, shoulders drawn back, abdominal muscles slightly tense, knees unlocked (if standing) and feet hip width apart. Slowly retract your chin so your head slides back and your chin slightly lowers. Continue to look straight ahead. You should feel a gentle stretch in the back of your head. Be certain not to feel an aggressive stretch since this can cause headaches later. Hold for 15-20 seconds. Repeat 2-3 times. Complete this exercise 1 time per day.  STRETCH - Cervical Side Bend  Stand or sit on a firm surface. Assume a good posture: chest up, shoulders drawn back, abdominal muscles slightly tense, knees unlocked (if standing) and feet hip width apart. Without letting your nose or shoulders move, slowly tip your right / left ear to your shoulder until your feel a gentle stretch in the muscles on the opposite side of your neck. Hold 15-20 seconds. Repeat 2-3 times. Complete this exercise 1-2 times per day.  STRETCH - Cervical Rotators  Stand or sit on a firm surface. Assume a good posture: chest up, shoulders drawn back, abdominal muscles slightly tense, knees unlocked (if standing) and feet hip width apart. Keeping your eyes level with the ground, slowly turn your head until you feel a gentle stretch along the back and opposite side of your neck. Hold 15-20 seconds. Repeat 2-3 times. Complete this exercise 1-2 times per day.  RANGE OF MOTION -  Neck Circles  Stand or sit on a firm surface. Assume a good posture: chest up, shoulders drawn back, abdominal muscles slightly tense, knees unlocked (if standing) and feet hip width apart. Gently roll your head down and around from the back of one shoulder to the back of the other. The motion should never be forced or  painful. Repeat the motion 10-20 times, or until you feel the neck muscles relax and loosen. Repeat 2-3 times. Complete the exercise 1-2 times per day. STRENGTHENING EXERCISES - Cervical Strain and Sprain These exercises may help you when beginning to rehabilitate your injury. They may resolve your symptoms with or without further involvement from your physician, physical therapist, or athletic trainer. While completing these exercises, remember:  Muscles can gain both the endurance and the strength needed for everyday activities through controlled exercises. Complete these exercises as instructed by your physician, physical therapist, or athletic trainer. Progress the resistance and repetitions only as guided. You may experience muscle soreness or fatigue, but the pain or discomfort you are trying to eliminate should never worsen during these exercises. If this pain does worsen, stop and make certain you are following the directions exactly. If the pain is still present after adjustments, discontinue the exercise until you can discuss the trouble with your clinician.  STRENGTH - Cervical Flexors, Isometric Face a wall, standing about 6 inches away. Place a small pillow, a ball about 6-8 inches in diameter, or a folded towel between your forehead and the wall. Slightly tuck your chin and gently push your forehead into the soft object. Push only with mild to moderate intensity, building up tension gradually. Keep your jaw and forehead relaxed. Hold 10 to 20 seconds. Keep your breathing relaxed. Release the tension slowly. Relax your neck muscles completely before you start the next repetition. Repeat 2-3 times. Complete this exercise 1 time per day.  STRENGTH- Cervical Lateral Flexors, Isometric  Stand about 6 inches away from a wall. Place a small pillow, a ball about 6-8 inches in diameter, or a folded towel between the side of your head and the wall. Slightly tuck your chin and gently tilt your  head into the soft object. Push only with mild to moderate intensity, building up tension gradually. Keep your jaw and forehead relaxed. Hold 10 to 20 seconds. Keep your breathing relaxed. Release the tension slowly. Relax your neck muscles completely before you start the next repetition. Repeat 2-3 times. Complete this exercise 1 time per day.  STRENGTH - Cervical Extensors, Isometric  Stand about 6 inches away from a wall. Place a small pillow, a ball about 6-8 inches in diameter, or a folded towel between the back of your head and the wall. Slightly tuck your chin and gently tilt your head back into the soft object. Push only with mild to moderate intensity, building up tension gradually. Keep your jaw and forehead relaxed. Hold 10 to 20 seconds. Keep your breathing relaxed. Release the tension slowly. Relax your neck muscles completely before you start the next repetition. Repeat 2-3 times. Complete this exercise 1 time per day.  POSTURE AND BODY MECHANICS CONSIDERATIONS Keeping correct posture when sitting, standing or completing your activities will reduce the stress put on different body tissues, allowing injured tissues a chance to heal and limiting painful experiences. The following are general guidelines for improved posture. Your physician or physical therapist will provide you with any instructions specific to your needs. While reading these guidelines, remember: The exercises prescribed by your provider will  help you have the flexibility and strength to maintain correct postures. The correct posture provides the optimal environment for your joints to work. All of your joints have less wear and tear when properly supported by a spine with good posture. This means you will experience a healthier, less painful body. Correct posture must be practiced with all of your activities, especially prolonged sitting and standing. Correct posture is as important when doing repetitive low-stress  activities (typing) as it is when doing a single heavy-load activity (lifting).  PROLONGED STANDING WHILE SLIGHTLY LEANING FORWARD When completing a task that requires you to lean forward while standing in one place for a long time, place either foot up on a stationary 2- to 4-inch high object to help maintain the best posture. When both feet are on the ground, the low back tends to lose its slight inward curve. If this curve flattens (or becomes too large), then the back and your other joints will experience too much stress, fatigue more quickly, and can cause pain.   RESTING POSITIONS Consider which positions are most painful for you when choosing a resting position. If you have pain with flexion-based activities (sitting, bending, stooping, squatting), choose a position that allows you to rest in a less flexed posture. You would want to avoid curling into a fetal position on your side. If your pain worsens with extension-based activities (prolonged standing, working overhead), avoid resting in an extended position such as sleeping on your stomach. Most people will find more comfort when they rest with their spine in a more neutral position, neither too rounded nor too arched. Lying on a non-sagging bed on your side with a pillow between your knees, or on your back with a pillow under your knees will often provide some relief. Keep in mind, being in any one position for a prolonged period of time, no matter how correct your posture, can still lead to stiffness.  WALKING Walk with an upright posture. Your ears, shoulders, and hips should all line up. OFFICE WORK When working at a desk, create an environment that supports good, upright posture. Without extra support, muscles fatigue and lead to excessive strain on joints and other tissues.  CHAIR: A chair should be able to slide under your desk when your back makes contact with the back of the chair. This allows you to work closely. The chair's height  should allow your eyes to be level with the upper part of your monitor and your hands to be slightly lower than your elbows. Body position: Your feet should make contact with the floor. If this is not possible, use a foot rest. Keep your ears over your shoulders. This will reduce stress on your neck and low back.
